# Patient Record
Sex: Male | Born: 1935 | Race: White | Hispanic: No | Marital: Married | Smoking: Former smoker
Health system: Southern US, Community
[De-identification: ages and names within clinical notes are randomized; demographics above are authoritative.]

## PROBLEM LIST (undated history)

## (undated) DIAGNOSIS — N183 Chronic kidney disease, stage 3 unspecified: Secondary | ICD-10-CM

## (undated) DIAGNOSIS — G8929 Other chronic pain: Secondary | ICD-10-CM

## (undated) DIAGNOSIS — C7951 Secondary malignant neoplasm of bone: Secondary | ICD-10-CM

## (undated) DIAGNOSIS — S12100A Unspecified displaced fracture of second cervical vertebra, initial encounter for closed fracture: Secondary | ICD-10-CM

## (undated) DIAGNOSIS — R531 Weakness: Secondary | ICD-10-CM

## (undated) DIAGNOSIS — R2681 Unsteadiness on feet: Secondary | ICD-10-CM

## (undated) DIAGNOSIS — M1990 Unspecified osteoarthritis, unspecified site: Secondary | ICD-10-CM

## (undated) DIAGNOSIS — Z7189 Other specified counseling: Secondary | ICD-10-CM

## (undated) DIAGNOSIS — I10 Essential (primary) hypertension: Secondary | ICD-10-CM

## (undated) DIAGNOSIS — Z789 Other specified health status: Secondary | ICD-10-CM

## (undated) DIAGNOSIS — M899 Disorder of bone, unspecified: Secondary | ICD-10-CM

## (undated) DIAGNOSIS — I872 Venous insufficiency (chronic) (peripheral): Secondary | ICD-10-CM

## (undated) DIAGNOSIS — R5381 Other malaise: Secondary | ICD-10-CM

## (undated) DIAGNOSIS — M48061 Spinal stenosis, lumbar region without neurogenic claudication: Secondary | ICD-10-CM

## (undated) DIAGNOSIS — Z8673 Personal history of transient ischemic attack (TIA), and cerebral infarction without residual deficits: Secondary | ICD-10-CM

## (undated) DIAGNOSIS — Z85038 Personal history of other malignant neoplasm of large intestine: Secondary | ICD-10-CM

## (undated) DIAGNOSIS — C8580 Other specified types of non-Hodgkin lymphoma, unspecified site: Secondary | ICD-10-CM

## (undated) DIAGNOSIS — M545 Low back pain, unspecified: Secondary | ICD-10-CM

## (undated) DIAGNOSIS — C801 Malignant (primary) neoplasm, unspecified: Secondary | ICD-10-CM

## (undated) HISTORY — DX: Chronic kidney disease, stage 3 unspecified: N18.30

## (undated) HISTORY — DX: Chronic kidney disease, stage 3 (moderate): N18.3

## (undated) HISTORY — DX: Low back pain: M54.5

## (undated) HISTORY — DX: Personal history of other malignant neoplasm of large intestine: Z85.038

## (undated) HISTORY — DX: Other specified counseling: Z71.89

## (undated) HISTORY — DX: Secondary malignant neoplasm of bone: C79.51

## (undated) HISTORY — DX: Venous insufficiency (chronic) (peripheral): I87.2

## (undated) HISTORY — DX: Spinal stenosis, lumbar region without neurogenic claudication: M48.061

## (undated) HISTORY — DX: Unspecified osteoarthritis, unspecified site: M19.90

## (undated) HISTORY — DX: Weakness: R53.1

## (undated) HISTORY — DX: Unspecified displaced fracture of second cervical vertebra, initial encounter for closed fracture: S12.100A

## (undated) HISTORY — DX: Other malaise: R53.81

## (undated) HISTORY — DX: Other specified types of non-hodgkin lymphoma, unspecified site: C85.80

## (undated) HISTORY — DX: Personal history of transient ischemic attack (TIA), and cerebral infarction without residual deficits: Z86.73

## (undated) HISTORY — DX: Low back pain, unspecified: M54.50

## (undated) HISTORY — DX: Other specified health status: Z78.9

## (undated) HISTORY — DX: Unsteadiness on feet: R26.81

## (undated) HISTORY — DX: Disorder of bone, unspecified: M89.9

## (undated) HISTORY — DX: Other chronic pain: G89.29

## (undated) HISTORY — DX: Malignant (primary) neoplasm, unspecified: C80.1

## (undated) HISTORY — DX: Essential (primary) hypertension: I10

---

## 1972-04-11 HISTORY — PX: INGUINAL HERNIA REPAIR: SHX194

## 1982-04-11 HISTORY — PX: CHOLECYSTECTOMY: SHX55

## 1991-04-12 DIAGNOSIS — Z85038 Personal history of other malignant neoplasm of large intestine: Secondary | ICD-10-CM

## 1991-04-12 HISTORY — DX: Personal history of other malignant neoplasm of large intestine: Z85.038

## 1991-04-12 HISTORY — PX: LUNG LOBECTOMY: SHX167

## 1991-04-12 HISTORY — PX: PARTIAL COLECTOMY: SHX5273

## 1999-11-12 ENCOUNTER — Encounter (INDEPENDENT_AMBULATORY_CARE_PROVIDER_SITE_OTHER): Payer: Self-pay | Admitting: Specialist

## 1999-11-12 ENCOUNTER — Ambulatory Visit (HOSPITAL_COMMUNITY): Admission: RE | Admit: 1999-11-12 | Discharge: 1999-11-12 | Payer: Self-pay | Admitting: Gastroenterology

## 2002-07-25 ENCOUNTER — Encounter: Payer: Self-pay | Admitting: Ophthalmology

## 2002-07-30 ENCOUNTER — Ambulatory Visit (HOSPITAL_COMMUNITY): Admission: RE | Admit: 2002-07-30 | Discharge: 2002-07-30 | Payer: Self-pay | Admitting: Ophthalmology

## 2003-09-30 ENCOUNTER — Encounter: Admission: RE | Admit: 2003-09-30 | Discharge: 2003-09-30 | Payer: Self-pay | Admitting: Cardiology

## 2003-11-19 ENCOUNTER — Ambulatory Visit (HOSPITAL_COMMUNITY): Admission: RE | Admit: 2003-11-19 | Discharge: 2003-11-19 | Payer: Self-pay | Admitting: Gastroenterology

## 2003-11-19 ENCOUNTER — Encounter (INDEPENDENT_AMBULATORY_CARE_PROVIDER_SITE_OTHER): Payer: Self-pay | Admitting: *Deleted

## 2009-06-08 ENCOUNTER — Encounter: Admission: RE | Admit: 2009-06-08 | Discharge: 2009-06-08 | Payer: Self-pay | Admitting: Orthopaedic Surgery

## 2010-04-11 HISTORY — PX: LUMBAR SPINE SURGERY: SHX701

## 2010-05-02 ENCOUNTER — Encounter: Payer: Self-pay | Admitting: Orthopaedic Surgery

## 2010-08-27 NOTE — H&P (Signed)
NAME:  Brendan Gonzalez, Brendan Gonzalez                           ACCOUNT NO.:  [id number]   MEDICAL RECORD NO.:  [id number]                   PATIENT TYPE:  OIB   LOCATION:  2899                                 FACILITY:  [facility]   PHYSICIAN:  Guadelupe Sabin, M.D.             DATE OF BIRTH:  04-25-1919   DATE OF ADMISSION:  07/30/2002  DATE OF DISCHARGE:                                HISTORY & PHYSICAL   CHIEF COMPLAINT:  This was a planned outpatient surgical admission of this  77 year old white male admitted for cataract implant surgery of the left  eye.   HISTORY OF PRESENT ILLNESS:  This patient has been seen in my office since  September 2000, complaining of blurred vision at distant and TV watching.  Fine print also was difficult.  The patient's visual acuity was recorded at  20/60- right eye, 20/60 left eye without correction, and 20/40 right eye and  20/30 left eye with correction.  The patient was issued new spectacle  lenses.  Nuclear cataract formation was noted in both eyes.  A detailed  fundus examination was normal.  Over the ensuing years, the patient has had  several refraction changes, as the cataract gradually became worse.  The  patient has elected to proceed with cataract implant surgery of the left eye  at this time, hoping for improved vision.  The patient was given an oral  discussion and printed information concerning the operative procedure and  its possible complications.  He signed an informed consent and arrangements  were made for his outpatient admission at this time.   PAST MEDICAL HISTORY:  The patient is under the care of Dr. Jaclyn Prime. Lucas Mallow,  who feels that the patient is in good general health and an average risk for  the proposed surgery.  He notes the patient's hypertension, treated with  medication.   ALLERGIES:  No known drug allergies.   REVIEW OF SYSTEMS:  No cardiorespiratory complaints.   PHYSICAL EXAMINATION:  VITAL SIGNS:  As recorded on admission,  blood  pressure 133/84, pulse 57, respirations 20, temperature 97.7 degrees.  GENERAL:  The patient is a pleasant, well-nourished, well-developed 77-year-  old white male, in no acute distress.  HEENT:  Eyes:  Visual acuity last recorded at 20/50 right eye, 20/40- left  eye, with the best correction.  Slit lamp examination confirms the presence  of nuclear cataract formation in both eyes.  Applanation tonometry 16 mm  both eyes.  A detailed fundus examination was normal.  CHEST:  Lungs clear to percussion and auscultation.  HEART:  Normal sinus rhythm, no cardiomegaly, no murmurs.  ABDOMEN:  Negative.  EXTREMITIES:  Negative.   ADMISSION DIAGNOSIS:  Senile nuclear cataract, both eyes.   SURGICAL PLAN:  A cataract implant surgery, left eye now, and right eye  later as needed.  Guadelupe Sabin, M.D.   HNJ/MEDQ  D:  07/30/2002  T:  07/30/2002  Job:  [id number]

## 2010-08-27 NOTE — Procedures (Signed)
[facility]  Patient:    Brendan Gonzalez, Brendan Gonzalez                        MRN: [id number] Proc. Date: 11/12/99 Adm. Date:  [id number] Attending:  Nelda Marseille CC:         Jaclyn Prime. Lucas Mallow, M.D.                           Procedure Report  PROCEDURE:  Colonoscopy with hot biopsy.  INDICATIONS:  Patient with history of colon cancer due for colonic screening.  INFORMED CONSENT:  Consent was signed after risk, benefits, methods and options were thoroughly discussed in the office multiple times in the past.   MEDICINES USED:  Demerol 60 mg, Versed 5 mg.  DESCRIPTION OF PROCEDURE:  Rectal inspection was pertinent for external hemorrhoids.  Digital exam was negative.  The video colonoscope was inserted and easily advanced around the colon to the cecum.  This did require some left abdominal pressure but no position changes.  The cecum was identified by the appendiceal orifice and the ileocecal valve.  No obvious abnormalities were seen on insertion.  The scope was then slowly withdrawn.  The prep was adequate.  There was some liquid stool that required washing and suctioning. On slow withdrawal through the colon, the cecum and ascending were normal.  In the mid transverse, a 2 mm polyp was seen and was hot biopsied x 1.  No other abnormality was seen as we slowly withdrew back to the rectum except for the anastomosis being seen at about 20 cm.  Once back in the rectum, the scope was retroflexed, pertinent for some internal hemorrhoids.  The scope was straightened and advanced a short ways up the sigmoid.  Air was suctioned and the scope removed.  The patient tolerated the procedure well, and there was no obvious immediate complication.  ENDOSCOPIC DIAGNOSES: 1. Internal and external hemorrhoids. 2. Left-sided anastomosis at 20 cm. 3. Proximal level of the mid transverse a 2 mm polyp, status post hot    biopsied. 4. Otherwise within normal limits to the  cecum.  PLAN:  Yearly rectals and guaiacs and blood work to include CBC, liver test, and CEA and probably PSA as well for screening as per Dr. Lucas Mallow.  Happy to see back p.r.n.  Otherwise probably recheck colonoscopy if doing well medically in five years unless needed sooner p.r.n. DD:  11/12/99 TD:  11/14/99 Job: [id number]

## 2010-08-27 NOTE — H&P (Signed)
NAME:  Brendan Gonzalez, Brendan Gonzalez                           ACCOUNT NO.:  [id number]   MEDICAL RECORD NO.:  [id number]                   PATIENT TYPE:  OIB   LOCATION:  2899                                 FACILITY:  [facility]   PHYSICIAN:  Guadelupe Sabin, M.D.             DATE OF BIRTH:  [birth year]   DATE OF ADMISSION:  07/30/2002  DATE OF DISCHARGE:                                HISTORY & PHYSICAL   PREOPERATIVE DIAGNOSIS:  Senile nuclear cataract, left eye.   POSTOPERATIVE DIAGNOSIS:  Senile nuclear cataract, left eye.   OPERATION:  Planned extracapsular cataract extraction, phacoemulsification,  primary insertion of posterior chamber intraocular lens implant, left eye.   SURGEON:  Guadelupe Sabin, M.D.   ASSISTANT:  Nurse.   ANESTHESIA:  Local 4% Xylocaine, 0.75% Marcaine, with Wydase added.  Anesthesia standby required.  The patient given sodium Pentothal  intravenously during the period of retrobulbar injection.  Topical  tetracaine and intraocular Xylocaine were also used.   DESCRIPTION OF PROCEDURE:  After the patient was prepped and draped, a lid  speculum was inserted in the left eye.  Schiotz tonometry was recorded at 5-  6 scale units with a 5.5 g weight, indicating a normal intraocular pressure.  A superior rectus traction suture was placed.  A peritomy was performed  adjacent to the limbus from the 11 to the 1 o'clock position.  The  corneal/scleral junction was cleaned, and a corneal/scleral groove made with  a 45-degree super blade.  The anterior chamber was then entered with the 2.5  mm diamond keratome at the 12 o'clock position, and the 15-degree blade at  the 2:30 position.  Using a bent #26 gauge needle on a Healon syringe, a  circular capsular rhexis was begun, and then completed with the Grabow  forceps.  Hydrodissection and hydrodelineation were performed using 1%  Xylocaine.  The nucleus was flipped vertically in the capsular bag, and then  cracked with the  Bechert pick and the phaco emulsifier tip.  A 30-degree  phaco emulsifier then emulsified the fragments.  The residual cortex was  then aspirated with the irrigation aspiration tip.  The total ultrasonic  time was 45 seconds.  The average power level was 14%.  The total amount of  fluid used was 60 mL.  Following the removal of the nucleus, the residual  cortex was aspirated with the irrigation aspiration tip.  The capsule  appeared intact with a brilliant red fundus reflex.  It was therefore  elected to insert an [employer] [id number] silicone three-piece  posterior chamber intraocular lens implant, diopter strength +17.00.  This  was inserted with the McDonald forceps into the anterior chamber and then  centered into the capsular bag using the [facility] lens rotator.  The lens  appeared to be well-centered.  The Healon which had been used during the  procedure was aspirated and replaced with balanced salt  solution and Miochol  ophthalmic solution.  The operative incisions appeared to be self-sealing,  and no sutures were required.  A light patch and protector shield were  applied to the operated left eye, along  with Maxitrol ointment.  The duration of the procedure and the anesthesia  administration was 45 minutes.  The patient tolerated the procedure well in general and left the operating  room for the recovery room in good condition.                                                Guadelupe Sabin, M.D.    HNJ/MEDQ  D:  07/30/2002  T:  07/30/2002  Job:  [id number]   cc:   Jaclyn Prime. Lucas Mallow, M.D.  [address]  [place]  [place] [address]  Fax: [phone]

## 2010-08-27 NOTE — Op Note (Signed)
NAME:  GONZALO, WAYMIRE                           ACCOUNT NO.:  [id number]   MEDICAL RECORD NO.:  [id number]                   PATIENT TYPE:  AMB   LOCATION:  ENDO                                 FACILITY:  [facility]   PHYSICIAN:  Petra Kuba, M.D.                 DATE OF BIRTH:  1919-10-26   DATE OF PROCEDURE:  11/19/2003  DATE OF DISCHARGE:                                 OPERATIVE REPORT   PROCEDURE:  Colonoscopy.   INDICATIONS FOR PROCEDURE:  History of colon cancer, due for repeat  screening.  Consent was signed after risks, benefits, methods, and options  were thoroughly discussed multiple times in the past.   MEDICATIONS USED:  Demerol 50, Versed 5.   PROCEDURE:  Rectal inspection was pertinent for external hemorrhoids, small.  Digital exam was negative.  The video colonoscope was inserted and easily  advanced around the colon to the cecum.  It did not require any abdominal  pressure or any position changes.  No obvious abnormality was seen on  insertion.  The cecum was identified by the appendiceal orifice and the  ileocecal valve.  The scope was slowly withdrawn.  The prep was adequate,  there was some liquid stool that required washing and suctioning.  On slow  withdrawal through the colon, four tiny polyps were seen which were all hot  biopsied x 1 and put in the same container.  They were in the ascending, 2  in the transverse, one in the descending.  There was a rare left sided  diverticula.  The low anastomosis was confirmed which looked normal.  No  other abnormalities were seen as we slowly withdrew back to the rectum.  Anorectal pull through and retroflexion confirmed some small hemorrhoids.  The scope was reinserted a short ways up the left side of the colon, air was  suctioned, the scope was removed.  The patient tolerated the procedure well.  There was no obvious immediate complications.   ENDOSCOPIC DIAGNOSIS:  1. Internal and external hemorrhoids.  2. Low  anastomosis.  3. Rare left sided diverticula.  4. Four tiny questionable polyps hot biopsied in the descending, two in the     transverse and one in the ascending.  5. Otherwise, within normal limits to the cecum.   PLAN:  Await pathology to determine future colonic screening.  Obviously,  will need to re-evaluate based on other medical problems.  Happy to see back  p.r.n.  Otherwise, return care to Dr. Lucas Mallow for the customary health care  maintenance and management to include yearly rectals and guaiacs.                                               Petra Kuba, M.D.    MEM/MEDQ  D:  11/19/2003  T:  11/19/2003  Job:  [id number]   cc:   Jaclyn Prime. Lucas Mallow, M.D.  [address]  [place]  [place] [address]  Fax: [phone]

## 2010-10-19 ENCOUNTER — Other Ambulatory Visit: Payer: Self-pay | Admitting: Internal Medicine

## 2010-10-19 DIAGNOSIS — H9390 Unspecified disorder of ear, unspecified ear: Secondary | ICD-10-CM

## 2010-10-22 ENCOUNTER — Ambulatory Visit
Admission: RE | Admit: 2010-10-22 | Discharge: 2010-10-22 | Disposition: A | Discharge status: Home / Self Care | Payer: PRIVATE HEALTH INSURANCE | Source: Ambulatory Visit | Attending: Internal Medicine | Admitting: Internal Medicine

## 2010-10-22 DIAGNOSIS — H9390 Unspecified disorder of ear, unspecified ear: Secondary | ICD-10-CM

## 2010-11-02 ENCOUNTER — Other Ambulatory Visit: Payer: Self-pay | Admitting: Internal Medicine

## 2010-11-02 DIAGNOSIS — E041 Nontoxic single thyroid nodule: Secondary | ICD-10-CM

## 2010-11-15 ENCOUNTER — Ambulatory Visit
Admission: RE | Admit: 2010-11-15 | Discharge: 2010-11-15 | Disposition: A | Discharge status: Home / Self Care | Payer: Medicare Other | Source: Ambulatory Visit | Attending: Internal Medicine | Admitting: Internal Medicine

## 2010-11-15 DIAGNOSIS — E041 Nontoxic single thyroid nodule: Secondary | ICD-10-CM

## 2011-06-23 ENCOUNTER — Other Ambulatory Visit: Payer: Self-pay | Admitting: Gastroenterology

## 2011-06-24 ENCOUNTER — Ambulatory Visit
Admission: RE | Admit: 2011-06-24 | Discharge: 2011-06-24 | Disposition: A | Discharge status: Home / Self Care | Payer: Medicare Other | Source: Ambulatory Visit | Attending: Gastroenterology | Admitting: Gastroenterology

## 2012-11-21 ENCOUNTER — Other Ambulatory Visit: Payer: Self-pay | Admitting: Neurological Surgery

## 2012-11-21 DIAGNOSIS — M79651 Pain in right thigh: Secondary | ICD-10-CM

## 2012-11-21 DIAGNOSIS — M545 Low back pain, unspecified: Secondary | ICD-10-CM

## 2012-11-21 DIAGNOSIS — M7918 Myalgia, other site: Secondary | ICD-10-CM

## 2012-11-21 DIAGNOSIS — M25561 Pain in right knee: Secondary | ICD-10-CM

## 2012-11-23 ENCOUNTER — Ambulatory Visit
Admission: RE | Admit: 2012-11-23 | Discharge: 2012-11-23 | Disposition: A | Discharge status: Home / Self Care | Payer: Medicare Other | Source: Ambulatory Visit | Attending: Neurological Surgery | Admitting: Neurological Surgery

## 2012-11-23 DIAGNOSIS — M7918 Myalgia, other site: Secondary | ICD-10-CM

## 2012-11-23 DIAGNOSIS — M79651 Pain in right thigh: Secondary | ICD-10-CM

## 2012-11-23 DIAGNOSIS — M545 Low back pain, unspecified: Secondary | ICD-10-CM

## 2012-11-23 DIAGNOSIS — M25561 Pain in right knee: Secondary | ICD-10-CM

## 2013-03-25 ENCOUNTER — Other Ambulatory Visit: Payer: Self-pay | Admitting: Internal Medicine

## 2013-03-25 DIAGNOSIS — E041 Nontoxic single thyroid nodule: Secondary | ICD-10-CM

## 2013-03-27 ENCOUNTER — Ambulatory Visit
Admission: RE | Admit: 2013-03-27 | Discharge: 2013-03-27 | Disposition: A | Discharge status: Home / Self Care | Payer: Medicare Other | Source: Ambulatory Visit | Attending: Internal Medicine | Admitting: Internal Medicine

## 2013-03-27 DIAGNOSIS — E041 Nontoxic single thyroid nodule: Secondary | ICD-10-CM

## 2013-04-01 ENCOUNTER — Other Ambulatory Visit: Payer: Self-pay | Admitting: Internal Medicine

## 2013-04-01 DIAGNOSIS — E041 Nontoxic single thyroid nodule: Secondary | ICD-10-CM

## 2013-04-24 ENCOUNTER — Ambulatory Visit
Admission: RE | Admit: 2013-04-24 | Discharge: 2013-04-24 | Disposition: A | Discharge status: Home / Self Care | Payer: Medicare Other | Source: Ambulatory Visit | Attending: Internal Medicine | Admitting: Internal Medicine

## 2013-04-24 ENCOUNTER — Other Ambulatory Visit (HOSPITAL_COMMUNITY)
Admission: RE | Admit: 2013-04-24 | Discharge: 2013-04-24 | Disposition: A | Discharge status: Home / Self Care | Payer: Medicare Other | Source: Ambulatory Visit | Attending: Interventional Radiology | Admitting: Interventional Radiology

## 2013-04-24 DIAGNOSIS — E041 Nontoxic single thyroid nodule: Secondary | ICD-10-CM | POA: Insufficient documentation

## 2013-11-22 ENCOUNTER — Other Ambulatory Visit: Payer: Self-pay | Admitting: Internal Medicine

## 2013-11-22 DIAGNOSIS — M48061 Spinal stenosis, lumbar region without neurogenic claudication: Secondary | ICD-10-CM

## 2013-11-26 ENCOUNTER — Ambulatory Visit
Admission: RE | Admit: 2013-11-26 | Discharge: 2013-11-26 | Disposition: A | Discharge status: Home / Self Care | Payer: Medicare Other | Source: Ambulatory Visit | Attending: Internal Medicine | Admitting: Internal Medicine

## 2013-11-26 DIAGNOSIS — M48061 Spinal stenosis, lumbar region without neurogenic claudication: Secondary | ICD-10-CM

## 2014-01-27 ENCOUNTER — Ambulatory Visit (INDEPENDENT_AMBULATORY_CARE_PROVIDER_SITE_OTHER): Payer: Medicare Other | Admitting: Family Medicine

## 2014-01-27 ENCOUNTER — Encounter: Payer: Self-pay | Admitting: Family Medicine

## 2014-01-27 VITALS — BP 138/97 | HR 68 | Temp 97.6°F | Resp 18 | Ht 66.0 in | Wt 158.0 lb

## 2014-01-27 DIAGNOSIS — J069 Acute upper respiratory infection, unspecified: Secondary | ICD-10-CM | POA: Insufficient documentation

## 2014-01-27 DIAGNOSIS — J209 Acute bronchitis, unspecified: Secondary | ICD-10-CM

## 2014-01-27 MED ORDER — PREDNISONE 20 MG PO TABS: ORAL_TABLET | ORAL | Status: DC

## 2014-01-27 MED ORDER — AZITHROMYCIN 250 MG PO TABS: ORAL_TABLET | ORAL | Status: DC

## 2014-01-27 NOTE — Progress Notes (Signed)
Office Note 01/27/2014  CC:  Chief Complaint  Patient presents with  . Establish Care  . URI    x 6 weeks  . Cough   HPI:  Brendan Gonzalez is a 80 y.o. White male who is here to establish care and discuss recent resp illness. Patient's most recent primary MD: Dr. Alyson Ingles at [facility]. Old records were not reviewed prior to or during today's visit except for some labs from 09/2013 that pt brought in with him today for me to copy.  These showed a normal CBC, normal CMET and magnesium except for Cr of 1.29 (which is about his baseline CRI), normal FLP, normal TSH, and borderline low vit D--for which he now takes daily vit D supplement.  About 6 weeks ago developed a URI, had temp 101 x 1 day, started coughing.  These sx's have all persisted (no worse and no better) except he has had no further fever.  No SOB.  Appetite fair. Taking cough drops and OTC tabs that say "relieves chest congestion".   Past Medical History  Diagnosis Date  . Colon cancer 1993  . Chronic renal insufficiency, stage III (moderate)     CrCl 40s  . Hypertension   . Lumbar spinal stenosis     Surgery 2012 helped but in 2015 pain has returned  . Osteoarthritis     Past Surgical History  Procedure Laterality Date  . Inguinal hernia repair  1974    Bilat  . Cholecystectomy  1984  . Partial colectomy  1993    for colon cancer  . Lung lobectomy  1993    LLL per pt--worry of possible cancer but turned out to be benign.  . Lumbar spine surgery  2012    for spinal stenosis (Dr. Jonelle Sports in [place])    Family History  Problem Relation Age of Onset  . Parkinson's disease Mother   . Heart attack Mother   . Cancer Brother   . Cancer Daughter     History   Social History  . Marital Status: Married    Spouse Name: N/A    Number of Children: N/A  . Years of Education: N/A   Occupational History  . Not on file.   Social History Main Topics  . Smoking status: Former Research scientist (life sciences)  .  Smokeless tobacco: Never Used  . Alcohol Use: 0.6 - 1.2 oz/week    1-2 Cans of beer per week     Comment: most days  . Drug Use: No  . Sexual Activity: Not on file   Other Topics Concern  . Not on file   Social History Narrative   Married, 1 son and 1 daughter.   Education: BS in Pharmacologist.   Occupation: Pharmacologist (retired).   As of 01/2014, living at [facility] with his wife.   No T/A/Ds.     MEDS: Not currently taking prednisone or azithromycin listed below Outpatient Encounter Prescriptions as of 01/27/2014  Medication Sig  . aspirin 325 MG tablet Take 325 mg by mouth daily.  [name] atenolol (TENORMIN) 100 MG tablet Take 100 mg by mouth daily.  . cholecalciferol (VITAMIN D) 1000 UNITS tablet Take 1,000 Units by mouth daily.  . enalapril (VASOTEC) 10 MG tablet Take 12.5 mg by mouth 2 (two) times daily.  [name] triamterene-hydrochlorothiazide (DYAZIDE) 50-25 MG per capsule Take 1 capsule by mouth every morning.  . vitamin C (ASCORBIC ACID) 500 MG tablet Take 500 mg by mouth daily.  . vitamin E 400  UNIT capsule Take 400 Units by mouth every other day.  [name] azithromycin (ZITHROMAX) 250 MG tablet 2 tabs po qd x 1d, then 1 tab po qd x 4d  . predniSONE (DELTASONE) 20 MG tablet 2 tabs po qd x 5d, then 1 tab po qd x 5d    No Known Allergies  ROS Review of Systems  Constitutional: Negative for fever and fatigue.  HENT: Negative for congestion and sore throat.   Eyes: Negative for visual disturbance.  Respiratory: Positive for cough.   Cardiovascular: Negative for chest pain.  Gastrointestinal: Negative for nausea and abdominal pain.  Genitourinary: Negative for dysuria.  Musculoskeletal: Negative for joint swelling.  Skin: Negative for rash.  Neurological: Negative for weakness and headaches.  Hematological: Negative for adenopathy.    PE; Blood pressure 138/97, pulse 68, temperature 97.6 F (36.4 C), temperature source Temporal, resp. rate 18, height 5\' 6"  (1.676 m), weight 158  lb (71.668 kg), SpO2 98.00%. VS: noted--normal. Gen: alert, NAD, NONTOXIC APPEARING. HEENT: eyes without injection, drainage, or swelling.  Ears: EACs clear, TMs with normal light reflex and landmarks.  Nose: Clear rhinorrhea, with some dried, crusty exudate adherent to mildly injected mucosa.  No purulent d/c.  No paranasal sinus TTP.  No facial swelling.  Throat and mouth without focal lesion.  No pharyngial swelling, erythema, or exudate.   Neck: supple, no LAD.   LUNGS: Some coarse insp/exp rhonchi bilat, nonlabored resps.  Aeration is fine.     CV: RRR, no m/r/g. EXT: no c/c/e SKIN: no rash   Pertinent labs:  None today (see HPI above for recent labs at previous PCP's office 10/04/13)  ASSESSMENT AND PLAN:   New pt; obtain old records.  Acute bronchitis with some sign of RAD.  Prolonged URI as well. Plan: azithromycin x 5d. Prednisone 40mg  qd x 5d, then 20mg  qd x 5d.   Mucinex DM OTC. Fluids, rest.  An After Visit Summary was printed and given to the patient.  Return in about 2 weeks (around 02/10/2014) for f/u resp illness.

## 2014-01-27 NOTE — Progress Notes (Signed)
Pre visit review using our clinic review tool, if applicable. No additional management support is needed unless otherwise documented below in the visit note. 

## 2014-02-10 ENCOUNTER — Encounter: Payer: Self-pay | Admitting: Family Medicine

## 2014-02-10 ENCOUNTER — Ambulatory Visit (INDEPENDENT_AMBULATORY_CARE_PROVIDER_SITE_OTHER): Payer: Medicare Other | Admitting: Family Medicine

## 2014-02-10 VITALS — BP 126/83 | HR 62 | Temp 98.0°F | Ht 66.0 in | Wt 156.0 lb

## 2014-02-10 DIAGNOSIS — N400 Enlarged prostate without lower urinary tract symptoms: Secondary | ICD-10-CM

## 2014-02-10 DIAGNOSIS — J209 Acute bronchitis, unspecified: Secondary | ICD-10-CM

## 2014-02-10 MED ORDER — TAMSULOSIN HCL 0.4 MG PO CAPS: ORAL_CAPSULE | ORAL | Status: DC

## 2014-02-10 NOTE — Progress Notes (Signed)
Pre visit review using our clinic review tool, if applicable. No additional management support is needed unless otherwise documented below in the visit note. 

## 2014-02-10 NOTE — Progress Notes (Signed)
OFFICE NOTE  02/16/2014  CC:  Chief Complaint  Patient presents with  . Follow-up     HPI: Patient is a 80 y.o. Caucasian male who is here for 2 wk f/u recent prolonged resp illness. He is s/p z-pack and prednisone 10d taper.  Mucinex DM symptomatic med. Very much improved: "almost back to normal".  Says he's had enlarged prostate x years: asks if he should get surgery to get it fixed. Sx's: Nocturia q2hrs, urinary hesitancy, incomplete emptying sometimes.  He recalls an episode of post-op acute urinary retention (back surgery 2012) but no other problems with urinary retention.  Some urgency sx's and occ incont --approx q2-3 d.  No dysuria.  He thinks he was tried on a med in the past for this that was very expensive but it didn't help, says it was not proscar but thinks it started with a P.   Pertinent PMH:  Past medical, surgical, social, and family history reviewed and no changes are noted since last office visit.  MEDS:  Outpatient Prescriptions Prior to Visit  Medication Sig Dispense Refill  . aspirin 325 MG tablet Take 325 mg by mouth daily.    [name] atenolol (TENORMIN) 100 MG tablet Take 100 mg by mouth daily.    . cholecalciferol (VITAMIN D) 1000 UNITS tablet Take 1,000 Units by mouth daily.    . enalapril (VASOTEC) 10 MG tablet Take 12.5 mg by mouth 2 (two) times daily.    [name] triamterene-hydrochlorothiazide (DYAZIDE) 50-25 MG per capsule Take 1 capsule by mouth every morning.    . vitamin C (ASCORBIC ACID) 500 MG tablet Take 500 mg by mouth daily.    . vitamin E 400 UNIT capsule Take 400 Units by mouth every other day.    [name] azithromycin (ZITHROMAX) 250 MG tablet 2 tabs po qd x 1d, then 1 tab po qd x 4d 6 tablet 0  . predniSONE (DELTASONE) 20 MG tablet 2 tabs po qd x 5d, then 1 tab po qd x 5d 15 tablet 0   No facility-administered medications prior to visit.    PE: Blood pressure 126/83, pulse 62, temperature 98 F (36.7 C), temperature source Temporal, height 5\' 6"  (1.676  m), weight 156 lb (70.761 kg), SpO2 98 %. Gen: Alert, well appearing.  Patient is oriented to person, place, time, and situation. CV: RRR Chest is clear, no wheezing or rales. Normal symmetric air entry throughout both lung fields. No chest wall deformities or tenderness. Rectal exam: negative without mass, lesions or tenderness, PROSTATE EXAM: smooth and symmetric without nodules or tenderness.   IMPRESSION AND PLAN:  1) Acute bronchitis; resolved appropriately.  2) BPH (plus possible urge incontinence). Decided on trial of flomax 0.4mg  qhs, titrate to 0.8mg  qhs over the next 1-2 wks as needed. Therapeutic expectations and side effect profile of medication discussed today.  Patient's questions answered. No urology referral at this time.  An After Visit Summary was printed and given to the patient.  FOLLOW UP: 4 wks

## 2014-02-12 ENCOUNTER — Ambulatory Visit: Payer: Medicare Other | Admitting: Family Medicine

## 2014-06-18 ENCOUNTER — Ambulatory Visit (INDEPENDENT_AMBULATORY_CARE_PROVIDER_SITE_OTHER): Payer: Medicare Other | Admitting: Family Medicine

## 2014-06-18 ENCOUNTER — Encounter: Payer: Self-pay | Admitting: Family Medicine

## 2014-06-18 VITALS — BP 140/90 | HR 56 | Temp 98.0°F | Ht 66.0 in | Wt 147.0 lb

## 2014-06-18 DIAGNOSIS — I10 Essential (primary) hypertension: Secondary | ICD-10-CM

## 2014-06-18 DIAGNOSIS — R05 Cough: Secondary | ICD-10-CM

## 2014-06-18 DIAGNOSIS — R059 Cough, unspecified: Secondary | ICD-10-CM

## 2014-06-18 DIAGNOSIS — N138 Other obstructive and reflux uropathy: Secondary | ICD-10-CM

## 2014-06-18 DIAGNOSIS — Z23 Encounter for immunization: Secondary | ICD-10-CM

## 2014-06-18 DIAGNOSIS — N401 Enlarged prostate with lower urinary tract symptoms: Secondary | ICD-10-CM

## 2014-06-18 MED ORDER — ATENOLOL 100 MG PO TABS: ORAL_TABLET | ORAL | Status: DC

## 2014-06-18 MED ORDER — TRIAMTERENE-HCTZ 75-50 MG PO TABS: 1.0000 | ORAL_TABLET | Freq: Every day | ORAL | Status: DC

## 2014-06-18 NOTE — Addendum Note (Signed)
Addended by: Julieta Bellini on: 06/18/2014 01:19 PM   Modules accepted: Orders

## 2014-06-18 NOTE — Patient Instructions (Signed)
Call our office if your blood pressure is consistently more than 150 on top or consistently more than 100 on bottom.    Stop your enalapril (Vasotec).

## 2014-06-18 NOTE — Progress Notes (Signed)
OFFICE NOTE  06/18/2014  CC:  Chief Complaint  Patient presents with  . Follow-up   HPI: Patient is a 81 y.o. Caucasian male who is here for f/u BPH, HTN. Back in November 2015 I rx'd him flomax.  Not taking it regularly, not noticing much difference. He wants to continue to try to take it more regularly: no switch desired at this time.  He monitors bp at home and gets <140/90 consistently. Denies HA/dizziness/CP/SOB.  No hypotension.  Has chronic cough, often worse in afternoons per wife but worse in mornings per pt.  Says it has been present a couple years at least, not sure if related to starting a certain med or not.   No OTC meds tried for the cough.  Pertinent PMH:  Past medical, surgical, social, and family history reviewed and no changes are noted since last office visit.  MEDS:  Outpatient Prescriptions Prior to Visit  Medication Sig Dispense Refill  . aspirin 325 MG tablet Take 325 mg by mouth 4 (four) times a week.     [name] atenolol (TENORMIN) 100 MG tablet 100 mg. Take 1/2 Tablets By Mouth Twice A Day    . cholecalciferol (VITAMIN D) 1000 UNITS tablet Take 1,000 Units by mouth daily.    . tamsulosin (FLOMAX) 0.4 MG CAPS capsule 1-2 tabs po qhs 60 capsule 3  . vitamin C (ASCORBIC ACID) 500 MG tablet Take 500 mg by mouth daily.    . vitamin E 400 UNIT capsule Take 400 Units by mouth every other day.    . enalapril (VASOTEC) 10 MG tablet Take 12.5 mg by mouth 2 (two) times daily.    [name] triamterene-hydrochlorothiazide (DYAZIDE) 50-25 MG per capsule Take 1 capsule by mouth every morning.     No facility-administered medications prior to visit.    PE: Blood pressure 140/90, pulse 56, temperature 98 F (36.7 C), temperature source Oral, height 5\' 6"  (1.676 m), weight 147 lb (66.679 kg), SpO2 99 %. Gen: Alert, well appearing.  Patient is oriented to person, place, time, and situation. FUX:NATF: no injection, icteris, swelling, or exudate.  EOMI, PERRLA. Mouth: lips without  lesion/swelling.  Oral mucosa pink and moist. Oropharynx without erythema, exudate, or swelling.  CV: RRR, no m/r/g.   LUNGS: CTA bilat, nonlabored resps, good aeration in all lung fields. EXT: no clubbing, cyanosis, or edema.   LABS: none  IMPRESSION AND PLAN:  1) HTN, well controlled.   Will continue atenolol and dyazide at current dosing. D/c enalapril due to possible ACE-I induced cough.  BP monitoring to continue at home, parameters given to call clinic for. Wanted to do BMET today but my staff was taken away early so no lab draw available. Will do this at next f/u.  2) Cough: ? ACE-I induced.  Stop enalapril and we'll give it at least a month to resolve.  3) BPH: no clear response to flomax but pt wants to keep trying it and take it more consistently to give it a fair chance.  An After Visit Summary was printed and given to the patient.  FOLLOW UP: 3 mo

## 2014-06-18 NOTE — Progress Notes (Signed)
Pre visit review using our clinic review tool, if applicable. No additional management support is needed unless otherwise documented below in the visit note. 

## 2014-06-19 ENCOUNTER — Telehealth: Payer: Self-pay | Admitting: Family Medicine

## 2014-06-19 NOTE — Telephone Encounter (Signed)
emmi mailed  °

## 2014-11-15 ENCOUNTER — Emergency Department (HOSPITAL_BASED_OUTPATIENT_CLINIC_OR_DEPARTMENT_OTHER)
Admission: EM | Admit: 2014-11-15 | Discharge: 2014-11-15 | Disposition: A | Discharge status: Home / Self Care | Payer: Medicare Other | Attending: Emergency Medicine | Admitting: Emergency Medicine

## 2014-11-15 ENCOUNTER — Encounter (HOSPITAL_BASED_OUTPATIENT_CLINIC_OR_DEPARTMENT_OTHER): Payer: Self-pay | Admitting: *Deleted

## 2014-11-15 ENCOUNTER — Emergency Department (HOSPITAL_BASED_OUTPATIENT_CLINIC_OR_DEPARTMENT_OTHER): Payer: Medicare Other

## 2014-11-15 DIAGNOSIS — Y929 Unspecified place or not applicable: Secondary | ICD-10-CM | POA: Diagnosis not present

## 2014-11-15 DIAGNOSIS — Z87891 Personal history of nicotine dependence: Secondary | ICD-10-CM | POA: Diagnosis not present

## 2014-11-15 DIAGNOSIS — Z7982 Long term (current) use of aspirin: Secondary | ICD-10-CM | POA: Insufficient documentation

## 2014-11-15 DIAGNOSIS — Y998 Other external cause status: Secondary | ICD-10-CM | POA: Diagnosis not present

## 2014-11-15 DIAGNOSIS — M1990 Unspecified osteoarthritis, unspecified site: Secondary | ICD-10-CM | POA: Insufficient documentation

## 2014-11-15 DIAGNOSIS — S20212A Contusion of left front wall of thorax, initial encounter: Secondary | ICD-10-CM | POA: Diagnosis not present

## 2014-11-15 DIAGNOSIS — S50312A Abrasion of left elbow, initial encounter: Secondary | ICD-10-CM | POA: Insufficient documentation

## 2014-11-15 DIAGNOSIS — Z85038 Personal history of other malignant neoplasm of large intestine: Secondary | ICD-10-CM | POA: Diagnosis not present

## 2014-11-15 DIAGNOSIS — Z79899 Other long term (current) drug therapy: Secondary | ICD-10-CM | POA: Diagnosis not present

## 2014-11-15 DIAGNOSIS — S80212A Abrasion, left knee, initial encounter: Secondary | ICD-10-CM | POA: Insufficient documentation

## 2014-11-15 DIAGNOSIS — N183 Chronic kidney disease, stage 3 (moderate): Secondary | ICD-10-CM | POA: Diagnosis not present

## 2014-11-15 DIAGNOSIS — W010XXA Fall on same level from slipping, tripping and stumbling without subsequent striking against object, initial encounter: Secondary | ICD-10-CM | POA: Diagnosis not present

## 2014-11-15 DIAGNOSIS — Y9301 Activity, walking, marching and hiking: Secondary | ICD-10-CM | POA: Diagnosis not present

## 2014-11-15 DIAGNOSIS — S2232XA Fracture of one rib, left side, initial encounter for closed fracture: Secondary | ICD-10-CM | POA: Diagnosis not present

## 2014-11-15 DIAGNOSIS — S29001A Unspecified injury of muscle and tendon of front wall of thorax, initial encounter: Secondary | ICD-10-CM | POA: Diagnosis present

## 2014-11-15 DIAGNOSIS — I129 Hypertensive chronic kidney disease with stage 1 through stage 4 chronic kidney disease, or unspecified chronic kidney disease: Secondary | ICD-10-CM | POA: Insufficient documentation

## 2014-11-15 MED ORDER — HYDROCODONE-ACETAMINOPHEN 5-325 MG PO TABS: 0.5000 | ORAL_TABLET | Freq: Four times a day (QID) | ORAL | Status: DC | PRN

## 2014-11-15 NOTE — ED Provider Notes (Signed)
CSN: [id number]     Arrival date & time 11/15/14  1122 History   First MD Initiated Contact with Patient 11/15/14 1131     Chief Complaint  Patient presents with  . Fall     (Consider location/radiation/quality/duration/timing/severity/associated sxs/prior Treatment) HPI Patient tripped and fell while walking 3 days ago. He suffered injuries to his left ribs, left elbow and left knee as result of event. Pain at left ribs is worse with changing positions or taking deep inspiration. He denies shortness of breath. Denies abdominal pain. Denies striking his head no neck pain no loss of consciousness no focal numbness or weakness. Pain is improved when he remains still. He is treated himself with hydrocodone last dose this morning with relief. He suffered abrasions to his left elbow and left knee which were treated by a nurse with an occlusive dressing at the left elbow and with Band-Aids at left knee. Pain at left elbow and left knee are minimal. No other associated symptoms Past Medical History  Diagnosis Date  . Colon cancer 1993  . Chronic renal insufficiency, stage III (moderate)     CrCl 40s  . Hypertension   . Lumbar spinal stenosis     Surgery 2012 helped but in 2015 pain has returned  . Osteoarthritis    Past Surgical History  Procedure Laterality Date  . Inguinal hernia repair  1974    Bilat  . Cholecystectomy  1984  . Partial colectomy  1993    for colon cancer  . Lung lobectomy  1993    LLL per pt--worry of possible cancer but turned out to be benign.  . Lumbar spine surgery  2012    for spinal stenosis (Dr. Jonelle Sports in [place])   Family History  Problem Relation Age of Onset  . Parkinson's disease Mother   . Heart attack Mother   . Cancer Brother   . Cancer Daughter    History  Substance Use Topics  . Smoking status: Former Research scientist (life sciences)  . Smokeless tobacco: Never Used  . Alcohol Use: 0.6 - 1.2 oz/week    1-2 Cans of beer per week     Comment: most days    Review of  Systems  Constitutional: Negative.   HENT: Negative.   Respiratory: Negative.   Cardiovascular: Positive for chest pain.       Left rib pain  Gastrointestinal: Negative.   Musculoskeletal: Negative.   Skin: Positive for wound.       Abrasions to left knee and left elbow  Allergic/Immunologic: Negative.        Up-to-date on tetanus immunization  Neurological: Negative.   Psychiatric/Behavioral: Negative.   All other systems reviewed and are negative.     Allergies  Review of patient's allergies indicates no known allergies.  Home Medications   Prior to Admission medications   Medication Sig Start Date End Date Taking? Authorizing Provider  aspirin 325 MG tablet Take 325 mg by mouth 4 (four) times a week.     Historical Provider, MD  atenolol (TENORMIN) 100 MG tablet Take 1/2 Tablets By Mouth Twice A Day 06/18/14   Tammi Sou, MD  cholecalciferol (VITAMIN D) 1000 UNITS tablet Take 1,000 Units by mouth daily.    Historical Provider, MD  tamsulosin (FLOMAX) 0.4 MG CAPS capsule 1-2 tabs po qhs 02/10/14   Tammi Sou, MD  triamterene-hydrochlorothiazide (MAXZIDE) 75-50 MG per tablet Take 1 tablet by mouth daily. 06/18/14   Tammi Sou, MD  vitamin C (ASCORBIC ACID)  500 MG tablet Take 500 mg by mouth daily.    Historical Provider, MD  vitamin E 400 UNIT capsule Take 400 Units by mouth every other day.    Historical Provider, MD   hydrocodone as needed BP 184/116 mmHg  Pulse 102  Temp(Src) 98.2 F (36.8 C) (Oral)  Resp 18  Wt 147 lb (66.679 kg)  SpO2 97% Physical Exam  Constitutional: He appears well-developed and well-nourished. No distress.  Alert Glasgow Coma Score 15  HENT:  Head: Normocephalic and atraumatic.  Eyes: Conjunctivae are normal. Pupils are equal, round, and reactive to light.  Neck: Neck supple. No tracheal deviation present. No thyromegaly present.  Cardiovascular: Normal rate and regular rhythm.   No murmur heard. Pulmonary/Chest: Effort normal  and breath sounds normal.  3 cm purplish ecchymosis at left anterior ribs approximate 5 cm superior to the costal margin. He is tender at left lateral ribs and midaxillary line no crepitance or flail  Abdominal: Soft. Bowel sounds are normal. He exhibits no distension. There is no tenderness.  Musculoskeletal: Normal range of motion. He exhibits no edema or tenderness.  Entire spine nontender. Thoracic kyphosis. Pelvis stable nontender. Left upper extremity there is a healing abrasion over the lateral aspect of the elbow. Full range of motion. No soft tissue swelling, neurovascular intact. Left lower extremity dime-sized abrasion over patella. No soft tissue swelling no bony tenderness for range of motion neurovascular intact.  Neurological: He is alert. No cranial nerve deficit. Coordination normal.  Gait normal  Skin: Skin is warm and dry. No rash noted.  Psychiatric: He has a normal mood and affect.  Nursing note and vitals reviewed.   ED Course  Procedures (including critical care time) Labs Review Labs Reviewed - No data to display  Imaging Review No results found.   EKG Interpretation None     Declines pain medicine here X-rays viewed by me No results found for this or any previous visit. Dg Ribs Unilateral W/chest Left  11/15/2014   CLINICAL DATA:  Fall 4 days ago w/ lt lateral to anterior rib pain, a bb marks this area, rx-smoker x 58 yrs, lt lung ca w/ lobectomy '76, Sob w/ rib pain while breathing, no cough  EXAM: LEFT RIBS AND CHEST - 3+ VIEW  COMPARISON:  None.  FINDINGS: There are nondisplaced fractures of the antral lateral left fourth, fifth sixth and seventh ribs.  No pneumothorax, pleural effusion or pulmonary contusion. Linear opacity is noted at both lung bases consistent with a combination of atelectasis and scarring. No lung consolidation or edema.  Surgical vascular clips project over the left hilum. Cardiac silhouette is normal in size. Aorta is uncoiled.  Bony  thorax is demineralized.  IMPRESSION: 1. Nondisplaced fractures of the left fourth through seventh ribs. No fracture complication.   Electronically Signed   By: Lajean Manes M.D.   On: 11/15/2014 12:34    12:58 PM patient's pain is under control MDM  X-rays of left upper extremity and left lower extremity not indicated. Discussed with patient and he agrees Plan he'll be written a prescription for Norco. Incentive spirometer to go.BP recheck 1 week. Hospitalization not indicated. Normal pulse oximetry normal respiratory rate injuries 8 days old Diagnosis #1 fall #2 multiple closed left rib fractures #3 abrasions multiple sites #4elevated blood pressure Final diagnoses:  None        Orlie Dakin, MD 11/15/14 1302

## 2014-11-15 NOTE — ED Notes (Signed)
Patient states he tripped and fell Wednesday afternoon after shopping, no LOC,  C/o L side rib pain, abrasion to left elbow dressed by nurse at their facility.

## 2014-11-15 NOTE — ED Notes (Signed)
MD at bedside. 

## 2014-11-15 NOTE — ED Notes (Signed)
Pt provided IS device with instructions on utilizing IS and importance of coughing and deep breathing, instructions provided by [facility] staff

## 2014-11-15 NOTE — ED Notes (Signed)
Pt states he fell Wednesday after leaving a grocery store, fell onto pavement, denied any dizziness, weakness or chest discomfort prior to fall, states has some pain on left side of chest

## 2014-11-15 NOTE — ED Notes (Signed)
Patient transported to X-ray via stretcher, sr x 2 up

## 2014-11-15 NOTE — ED Notes (Signed)
Left knee abrasions also noted on, bandaids removed for EDP evaluation, area cleaned, abx oint applied, bandaids applied

## 2014-11-15 NOTE — ED Notes (Signed)
Pt returns from radiology. 

## 2014-11-15 NOTE — Discharge Instructions (Signed)
Rib Fracture Take Tylenol for mild pain or the pain medicine prescribed for bad pain. Don't take Tylenol together with the pain medicine prescribed as the combination can be dangerous. Use the incentive spirometer 10 times every hour while awake(for example while watching television during every commercial). Get your blood pressure rechecked within a week. Today's was elevated at 188/110. Call Dr.Mckeown to arrange to be seen in the office if you has significant pain in 2 weeks. Return if your pain is not well controlled or if you feel worse for any reason. A rib fracture is a break or crack in one of the bones of the ribs. The ribs are a group of long, curved bones that wrap around your chest and attach to your spine. They protect your lungs and other organs in the chest cavity. A broken or cracked rib is often painful, but most do not cause other problems. Most rib fractures heal on their own over time. However, rib fractures can be more serious if multiple ribs are broken or if broken ribs move out of place and push against other structures. CAUSES   A direct blow to the chest. For example, this could happen during contact sports, a car accident, or a fall against a hard object.  Repetitive movements with high force, such as pitching a baseball or having severe coughing spells. SYMPTOMS   Pain when you breathe in or cough.  Pain when someone presses on the injured area. DIAGNOSIS  Your caregiver will perform a physical exam. Various imaging tests may be ordered to confirm the diagnosis and to look for related injuries. These tests may include a chest X-ray, computed tomography (CT), magnetic resonance imaging (MRI), or a bone scan. TREATMENT  Rib fractures usually heal on their own in 1-3 months. The longer healing period is often associated with a continued cough or other aggravating activities. During the healing period, pain control is very important. Medication is usually given to control  pain. Hospitalization or surgery may be needed for more severe injuries, such as those in which multiple ribs are broken or the ribs have moved out of place.  HOME CARE INSTRUCTIONS   Avoid strenuous activity and any activities or movements that cause pain. Be careful during activities and avoid bumping the injured rib.  Gradually increase activity as directed by your caregiver.  Only take over-the-counter or prescription medications as directed by your caregiver. Do not take other medications without asking your caregiver first.  Apply ice to the injured area for the first 1-2 days after you have been treated or as directed by your caregiver. Applying ice helps to reduce inflammation and pain.  Put ice in a plastic bag.  Place a towel between your skin and the bag.   Leave the ice on for 15-20 minutes at a time, every 2 hours while you are awake.  Perform deep breathing as directed by your caregiver. This will help prevent pneumonia, which is a common complication of a broken rib. Your caregiver may instruct you to:  Take deep breaths several times a day.  Try to cough several times a day, holding a pillow against the injured area.  Use a device called an incentive spirometer to practice deep breathing several times a day.  Drink enough fluids to keep your urine clear or pale yellow. This will help you avoid constipation.   Do not wear a rib belt or binder. These restrict breathing, which can lead to pneumonia.  SEEK IMMEDIATE MEDICAL CARE IF:  You have a fever.   You have difficulty breathing or shortness of breath.   You develop a continual cough, or you cough up thick or bloody sputum.  You feel sick to your stomach (nausea), throw up (vomit), or have abdominal pain.   You have worsening pain not controlled with medications.  MAKE SURE YOU:  Understand these instructions.  Will watch your condition.  Will get help right away if you are not doing well or get  worse. Document Released: 03/28/2005 Document Revised: 11/28/2012 Document Reviewed: 05/30/2012 [facility] Patient Information 2015 [place], [place]. This information is not intended to replace advice given to you by your health care provider. Make sure you discuss any questions you have with your health care provider.

## 2014-11-15 NOTE — ED Notes (Signed)
Pt questioned re: current medication or any blood thinners currently being taken, pt states he takes one medication for HTN and one 325mg  ASA 4 times / week. EDP informed

## 2015-03-13 ENCOUNTER — Other Ambulatory Visit: Payer: Self-pay | Admitting: Family Medicine

## 2015-03-13 MED ORDER — HYDROCODONE-ACETAMINOPHEN 5-325 MG PO TABS: 0.5000 | ORAL_TABLET | Freq: Four times a day (QID) | ORAL | Status: DC | PRN

## 2015-07-13 ENCOUNTER — Other Ambulatory Visit: Payer: Self-pay | Admitting: *Deleted

## 2015-07-13 NOTE — Telephone Encounter (Signed)
RF request for hydrocodone/apap LOV: 06/18/14 Next ov: None Last written: 03/13/15 #60 w/ 0RF  Pt called requesting refill. He stated that he does not take this often but occasionally has to take if for his back pain. He stated that his wife has took what was left of his medication due to her back pain. He is requesting a refill. Please advise. Thanks.

## 2015-07-14 DIAGNOSIS — H04123 Dry eye syndrome of bilateral lacrimal glands: Secondary | ICD-10-CM | POA: Diagnosis not present

## 2015-07-14 DIAGNOSIS — H43813 Vitreous degeneration, bilateral: Secondary | ICD-10-CM | POA: Diagnosis not present

## 2015-07-14 DIAGNOSIS — H35372 Puckering of macula, left eye: Secondary | ICD-10-CM | POA: Diagnosis not present

## 2015-07-14 DIAGNOSIS — H52223 Regular astigmatism, bilateral: Secondary | ICD-10-CM | POA: Diagnosis not present

## 2015-07-14 DIAGNOSIS — H524 Presbyopia: Secondary | ICD-10-CM | POA: Diagnosis not present

## 2015-07-14 MED ORDER — HYDROCODONE-ACETAMINOPHEN 5-325 MG PO TABS: 0.5000 | ORAL_TABLET | Freq: Four times a day (QID) | ORAL | Status: DC | PRN

## 2015-07-15 NOTE — Telephone Encounter (Signed)
Rx put up at front desk for p/u. Left message on both home and cell stating that Rx is ready for p/u at front desk.

## 2015-10-06 ENCOUNTER — Ambulatory Visit (INDEPENDENT_AMBULATORY_CARE_PROVIDER_SITE_OTHER): Payer: Medicare Other | Admitting: Family Medicine

## 2015-10-06 ENCOUNTER — Encounter: Payer: Self-pay | Admitting: Family Medicine

## 2015-10-06 VITALS — BP 184/110 | HR 63 | Temp 98.1°F | Resp 16 | Ht 65.5 in | Wt 163.2 lb

## 2015-10-06 DIAGNOSIS — I10 Essential (primary) hypertension: Secondary | ICD-10-CM

## 2015-10-06 DIAGNOSIS — Z23 Encounter for immunization: Secondary | ICD-10-CM

## 2015-10-06 DIAGNOSIS — Z0000 Encounter for general adult medical examination without abnormal findings: Secondary | ICD-10-CM | POA: Diagnosis not present

## 2015-10-06 LAB — BASIC METABOLIC PANEL
BUN: 14 mg/dL (ref 6–23)
CHLORIDE: 99 meq/L (ref 96–112)
CO2: 32 meq/L (ref 19–32)
Calcium: 9.4 mg/dL (ref 8.4–10.5)
Creatinine, Ser: 1.03 mg/dL (ref 0.40–1.50)
GFR: 71.45 mL/min (ref >60.00)
Glucose, Bld: 70 mg/dL (ref 70–99)
Potassium: 4.4 mEq/L (ref 3.5–5.1)
SODIUM: 138 meq/L (ref 135–145)

## 2015-10-06 MED ORDER — ENALAPRIL MALEATE 5 MG PO TABS: 5.0000 mg | ORAL_TABLET | Freq: Two times a day (BID) | ORAL | Status: DC

## 2015-10-06 NOTE — Progress Notes (Signed)
Pre visit review using our clinic review tool, if applicable. No additional management support is needed unless otherwise documented below in the visit note. 

## 2015-10-06 NOTE — Addendum Note (Signed)
Addended by: Onalee Hua on: 10/06/2015 09:38 AM   Modules accepted: Orders

## 2015-10-06 NOTE — Progress Notes (Signed)
The patient is here for annual Medicare wellness examination and management of other chronic and acute problems. Other problems discussed today: HTN: home bp varies: sometimes normal, sometimes like it is today. He has no symptoms when it is high.  He never has any low blood pressures.  Has intermittent wooziness---describes orthostatic disequilibrium.  No falls.  He is compliant with his bp med. EXAM: BP 184/110 mmHg  Pulse 63  Temp(Src) 98.1 F (36.7 C) (Oral)  Resp 16  Ht 5' 5.5" (1.664 m)  Wt 163 lb 4 oz (74.05 kg)  BMI 26.74 kg/m2  SpO2 97%bp recheck today 184/110 Gen: Alert, well appearing.  Patient is oriented to person, place, time, and situation. AFFECT: pleasant, lucid thought and speech. CV: RRR (rate 60-70), occ brief pause.  No m/r/g. Chest is clear, no wheezing or rales. Normal symmetric air entry throughout both lung fields. No chest wall deformities or tenderness. EXT: no clubbing, cyanosis, or edema.   A/P: HTN, poor control.  I will restart his enalapril (stopping this back in March 2016 did not change pt's cough per his report today) at 5mg  bid.  Continue atenolol 100mg , 1/2 tab bid and also Maxzide 50-25 1 cap qd. I'll see him back in 2-3 weeks for CPE. BMET drawn today.   AWV DATA The risk factors are reflected in the social history.  The roster of all physicians providing medical care to patient is listed in the Snapshot section of the chart.  Activities of daily living:  The patient is 100% independent in all ADLs: dressing, toileting, feeding as well as independent mobility.  Home safety : The patient has smoke detectors in the home. They wear seatbelts. No firearms at home ( firearms are present in the home, kept in a safe fashion). There is no violence in the home.   There is no risks for hepatitis, STDs or HIV. There is no history of blood transfusion. They have no travel history to infectious disease endemic areas of the world.  The patient has has seen  their dentist in the last six month. They have seen their eye doctor in the last year. They deny (admit to) any hearing difficulty and have not had audiologic testing in the last year.  They do not  have excessive sun exposure. Discussed the need for sun protection: hats, long sleeves and use of sunscreen if there is significant sun exposure.   Diet: the importance of a healthy diet is discussed. They do have a healthy diet.  The patient does not have a regular exercise program.  The benefits of regular aerobic exercise were discussed.  Depression screen: there are no signs or vegative symptoms of depression- irritability, change in appetite, anhedonia, sadness/tearfullness.  Cognitive assessment: the patient manages all their financial and personal affairs and is actively engaged. They could relate day,date,year and events; recalled 3/3 objects at 3 minutes; performed clock-face test normally.  Reviewed advance directives with pt today.  He has these in place.  The following portions of the patient's history were reviewed and updated as appropriate: allergies, current medications, past family history, past medical history,  past surgical history, past social history  and problem list.  Vision, hearing, body mass index were assessed and reviewed. Body mass index is 26.74 kg/(m^2).   During the course of the visit the patient was educated and counseled about appropriate screening and preventive services including :  Annual wellness visit: doing today. diabetes screening: drew fasting glucose today. colorectal cancer screening: pt declines  due to age. recommended immunizations (influenza, pneumococcal, Hep B): says he got pneumovax sometime in the last 10 yrs ([place]).  Wants to get prevnar 13 today. Bone mass measurement: n/a Counseling to prevent tobacco use: n/a Depression screening: discussed.  Brief screen today normal. Glaucoma screening: done by pt's eye MD (Dr. Bing Plume) Hepatitis C  virus screening: declines HIV virus screening: declines Lung cancer screening: n/a Medical nutrition therapy: n/a Prostate cancer screening: declines Screening mammography: n/a Screening pap tests, pelvic exam, and clinical breast exam: n/a Ultrasound screening for AAA: n/a  A written plan of action regarding the above screening and preventative services was given to the patient today.  We'll check fasting glucose today and we gave prevnar 13.  An After Visit Summary was printed and given to the patient.  Signed:  Crissie Sickles, MD           10/06/2015

## 2015-10-08 LAB — LIPID PANEL
CHOLESTEROL: 194 mg/dL (ref 0–200)
HDL: 75.1 mg/dL (ref >39.00)
LDL CALC: 96 mg/dL (ref 0–99)
NONHDL: 118.82
Total CHOL/HDL Ratio: 3
Triglycerides: 115 mg/dL (ref 0.0–149.0)
VLDL: 23 mg/dL (ref 0.0–40.0)

## 2016-01-21 ENCOUNTER — Other Ambulatory Visit: Payer: Self-pay | Admitting: Family Medicine

## 2016-01-21 MED ORDER — HYDROCODONE-ACETAMINOPHEN 5-325 MG PO TABS: 0.5000 | ORAL_TABLET | Freq: Four times a day (QID) | ORAL | 0 refills | Status: DC | PRN

## 2016-02-12 ENCOUNTER — Ambulatory Visit: Payer: Medicare Other

## 2016-03-14 ENCOUNTER — Emergency Department (HOSPITAL_COMMUNITY): Payer: Medicare Other

## 2016-03-14 ENCOUNTER — Observation Stay (HOSPITAL_COMMUNITY): Payer: Medicare Other

## 2016-03-14 ENCOUNTER — Encounter (HOSPITAL_COMMUNITY): Payer: Self-pay | Admitting: Emergency Medicine

## 2016-03-14 ENCOUNTER — Other Ambulatory Visit (HOSPITAL_COMMUNITY): Payer: Medicare Other

## 2016-03-14 ENCOUNTER — Inpatient Hospital Stay (HOSPITAL_COMMUNITY)
Admission: EM | Admit: 2016-03-14 | Discharge: 2016-03-16 | DRG: 066 | Disposition: A | Discharge status: Home Health | Payer: Medicare Other | Attending: Internal Medicine | Admitting: Internal Medicine

## 2016-03-14 DIAGNOSIS — M1990 Unspecified osteoarthritis, unspecified site: Secondary | ICD-10-CM | POA: Diagnosis present

## 2016-03-14 DIAGNOSIS — I160 Hypertensive urgency: Secondary | ICD-10-CM | POA: Diagnosis present

## 2016-03-14 DIAGNOSIS — R4781 Slurred speech: Secondary | ICD-10-CM

## 2016-03-14 DIAGNOSIS — Z9049 Acquired absence of other specified parts of digestive tract: Secondary | ICD-10-CM

## 2016-03-14 DIAGNOSIS — I639 Cerebral infarction, unspecified: Secondary | ICD-10-CM

## 2016-03-14 DIAGNOSIS — I10 Essential (primary) hypertension: Secondary | ICD-10-CM

## 2016-03-14 DIAGNOSIS — R471 Dysarthria and anarthria: Secondary | ICD-10-CM | POA: Diagnosis present

## 2016-03-14 DIAGNOSIS — N183 Chronic kidney disease, stage 3 unspecified: Secondary | ICD-10-CM

## 2016-03-14 DIAGNOSIS — R29818 Other symptoms and signs involving the nervous system: Secondary | ICD-10-CM | POA: Diagnosis not present

## 2016-03-14 DIAGNOSIS — I6789 Other cerebrovascular disease: Secondary | ICD-10-CM | POA: Diagnosis not present

## 2016-03-14 DIAGNOSIS — Z85038 Personal history of other malignant neoplasm of large intestine: Secondary | ICD-10-CM

## 2016-03-14 DIAGNOSIS — R29810 Facial weakness: Secondary | ICD-10-CM | POA: Diagnosis not present

## 2016-03-14 DIAGNOSIS — R2990 Unspecified symptoms and signs involving the nervous system: Secondary | ICD-10-CM | POA: Diagnosis not present

## 2016-03-14 DIAGNOSIS — E785 Hyperlipidemia, unspecified: Secondary | ICD-10-CM | POA: Diagnosis present

## 2016-03-14 DIAGNOSIS — I129 Hypertensive chronic kidney disease with stage 1 through stage 4 chronic kidney disease, or unspecified chronic kidney disease: Secondary | ICD-10-CM | POA: Diagnosis present

## 2016-03-14 DIAGNOSIS — Z79899 Other long term (current) drug therapy: Secondary | ICD-10-CM

## 2016-03-14 DIAGNOSIS — I6340 Cerebral infarction due to embolism of unspecified cerebral artery: Secondary | ICD-10-CM | POA: Insufficient documentation

## 2016-03-14 DIAGNOSIS — Z87891 Personal history of nicotine dependence: Secondary | ICD-10-CM

## 2016-03-14 DIAGNOSIS — Z7982 Long term (current) use of aspirin: Secondary | ICD-10-CM

## 2016-03-14 DIAGNOSIS — I63412 Cerebral infarction due to embolism of left middle cerebral artery: Secondary | ICD-10-CM | POA: Diagnosis not present

## 2016-03-14 LAB — PROTIME-INR
INR: 0.95
INR: 0.99
PROTHROMBIN TIME: 12.6 s (ref 11.4–15.2)
PROTHROMBIN TIME: 13.1 s (ref 11.4–15.2)

## 2016-03-14 LAB — COMPREHENSIVE METABOLIC PANEL
ALBUMIN: 3.9 g/dL (ref 3.5–5.0)
ALK PHOS: 53 U/L (ref 38–126)
ALT: 13 U/L — ABNORMAL LOW (ref 17–63)
ANION GAP: 8 (ref 5–15)
AST: 29 U/L (ref 15–41)
BUN: 16 mg/dL (ref 6–20)
CHLORIDE: 105 mmol/L (ref 101–111)
CO2: 26 mmol/L (ref 22–32)
Calcium: 9.6 mg/dL (ref 8.9–10.3)
Creatinine, Ser: 1.28 mg/dL — ABNORMAL HIGH (ref 0.61–1.24)
GFR calc Af Amer: 53 mL/min — ABNORMAL LOW (ref >60)
GFR calc non Af Amer: 46 mL/min — ABNORMAL LOW (ref >60)
GLUCOSE: 125 mg/dL — AB (ref 65–99)
POTASSIUM: 4 mmol/L (ref 3.5–5.1)
SODIUM: 139 mmol/L (ref 135–145)
Total Bilirubin: 1.2 mg/dL (ref 0.3–1.2)
Total Protein: 7.2 g/dL (ref 6.5–8.1)

## 2016-03-14 LAB — CBC
HCT: 44.2 % (ref 39.0–52.0)
Hemoglobin: 14.9 g/dL (ref 13.0–17.0)
MCH: 32 pg (ref 26.0–34.0)
MCHC: 33.7 g/dL (ref 30.0–36.0)
MCV: 95.1 fL (ref 78.0–100.0)
PLATELETS: 183 10*3/uL (ref 150–400)
RBC: 4.65 MIL/uL (ref 4.22–5.81)
RDW: 14.3 % (ref 11.5–15.5)
WBC: 8.2 10*3/uL (ref 4.0–10.5)

## 2016-03-14 LAB — URINALYSIS, ROUTINE W REFLEX MICROSCOPIC
BILIRUBIN URINE: NEGATIVE
GLUCOSE, UA: NEGATIVE mg/dL
Hgb urine dipstick: NEGATIVE
KETONES UR: NEGATIVE mg/dL
Leukocytes, UA: NEGATIVE
Nitrite: NEGATIVE
PH: 7.5 (ref 5.0–8.0)
Protein, ur: NEGATIVE mg/dL
SPECIFIC GRAVITY, URINE: 1.008 (ref 1.005–1.030)

## 2016-03-14 LAB — DIFFERENTIAL
BASOS PCT: 1 %
Basophils Absolute: 0 10*3/uL (ref 0.0–0.1)
EOS ABS: 0.3 10*3/uL (ref 0.0–0.7)
EOS PCT: 3 %
LYMPHS PCT: 21 %
Lymphs Abs: 1.8 10*3/uL (ref 0.7–4.0)
Monocytes Absolute: 0.8 10*3/uL (ref 0.1–1.0)
Monocytes Relative: 10 %
NEUTROS PCT: 65 %
Neutro Abs: 5.4 10*3/uL (ref 1.7–7.7)

## 2016-03-14 LAB — APTT
aPTT: 25 seconds (ref 24–36)
aPTT: 26 seconds (ref 24–36)

## 2016-03-14 LAB — RAPID URINE DRUG SCREEN, HOSP PERFORMED
AMPHETAMINES: NOT DETECTED
Barbiturates: NOT DETECTED
Benzodiazepines: NOT DETECTED
COCAINE: NOT DETECTED
OPIATES: NOT DETECTED
TETRAHYDROCANNABINOL: NOT DETECTED

## 2016-03-14 LAB — I-STAT TROPONIN, ED: Troponin i, poc: 0.01 ng/mL (ref 0.00–0.08)

## 2016-03-14 LAB — I-STAT CHEM 8, ED
BUN: 22 mg/dL — AB (ref 6–20)
CHLORIDE: 101 mmol/L (ref 101–111)
Calcium, Ion: 1.19 mmol/L (ref 1.15–1.40)
Creatinine, Ser: 1.3 mg/dL — ABNORMAL HIGH (ref 0.61–1.24)
Glucose, Bld: 124 mg/dL — ABNORMAL HIGH (ref 65–99)
HEMATOCRIT: 45 % (ref 39.0–52.0)
Hemoglobin: 15.3 g/dL (ref 13.0–17.0)
Potassium: 3.9 mmol/L (ref 3.5–5.1)
SODIUM: 139 mmol/L (ref 135–145)
TCO2: 30 mmol/L (ref 0–100)

## 2016-03-14 LAB — ETHANOL: Alcohol, Ethyl (B): <5 mg/dL (ref <5)

## 2016-03-14 LAB — CBG MONITORING, ED: GLUCOSE-CAPILLARY: 115 mg/dL — AB (ref 65–99)

## 2016-03-14 LAB — TROPONIN I

## 2016-03-14 MED ORDER — TRIAMTERENE-HCTZ 75-50 MG PO TABS
0.5000 | ORAL_TABLET | Freq: Two times a day (BID) | ORAL | Status: DC
  Filled 2016-03-14: qty 0.5

## 2016-03-14 MED ORDER — ACETAMINOPHEN 650 MG RE SUPP: 650.0000 mg | RECTAL | Status: DC | PRN

## 2016-03-14 MED ORDER — ASPIRIN 325 MG PO TABS: 325.0000 mg | ORAL_TABLET | Freq: Four times a day (QID) | ORAL | Status: DC | PRN

## 2016-03-14 MED ORDER — HYDRALAZINE HCL 20 MG/ML IJ SOLN: 5.0000 mg | Freq: Three times a day (TID) | INTRAMUSCULAR | Status: DC | PRN

## 2016-03-14 MED ORDER — ACETAMINOPHEN 325 MG PO TABS
650.0000 mg | ORAL_TABLET | ORAL | Status: DC | PRN
  Administered 2016-03-15: 650 mg via ORAL
  Filled 2016-03-14 (×3): qty 2

## 2016-03-14 MED ORDER — LABETALOL HCL 5 MG/ML IV SOLN
10.0000 mg | Freq: Once | INTRAVENOUS | Status: AC
  Administered 2016-03-14: 10 mg via INTRAVENOUS
  Filled 2016-03-14: qty 4

## 2016-03-14 MED ORDER — ARTIFICIAL TEARS OP OINT
TOPICAL_OINTMENT | Freq: Every day | OPHTHALMIC | Status: DC
  Administered 2016-03-14 – 2016-03-16 (×3): via OPHTHALMIC
  Filled 2016-03-14 (×3): qty 3.5

## 2016-03-14 MED ORDER — GADOBENATE DIMEGLUMINE 529 MG/ML IV SOLN
20.0000 mL | Freq: Once | INTRAVENOUS | Status: AC
  Administered 2016-03-14: 16 mL via INTRAVENOUS

## 2016-03-14 MED ORDER — SENNOSIDES-DOCUSATE SODIUM 8.6-50 MG PO TABS: 1.0000 | ORAL_TABLET | Freq: Every evening | ORAL | Status: DC | PRN

## 2016-03-14 MED ORDER — HEPARIN SODIUM (PORCINE) 5000 UNIT/ML IJ SOLN
5000.0000 [IU] | Freq: Three times a day (TID) | INTRAMUSCULAR | Status: DC
  Administered 2016-03-14 – 2016-03-16 (×6): 5000 [IU] via SUBCUTANEOUS
  Filled 2016-03-14 (×6): qty 1

## 2016-03-14 MED ORDER — SODIUM CHLORIDE 0.9 % IV BOLUS (SEPSIS)
1000.0000 mL | Freq: Once | INTRAVENOUS | Status: AC
Start: 2016-03-14 — End: 2016-03-14
  Administered 2016-03-14: 1000 mL via INTRAVENOUS

## 2016-03-14 MED ORDER — ATENOLOL 100 MG PO TABS
100.0000 mg | ORAL_TABLET | Freq: Every day | ORAL | Status: DC
  Administered 2016-03-15 – 2016-03-16 (×2): 100 mg via ORAL
  Filled 2016-03-14 (×2): qty 1

## 2016-03-14 MED ORDER — STROKE: EARLY STAGES OF RECOVERY BOOK
Freq: Once | Status: AC
  Administered 2016-03-15: 16:00:00
  Filled 2016-03-14 (×2): qty 1

## 2016-03-14 MED ORDER — HYDRALAZINE HCL 20 MG/ML IJ SOLN
5.0000 mg | Freq: Three times a day (TID) | INTRAMUSCULAR | Status: DC | PRN
  Administered 2016-03-14: 5 mg via INTRAVENOUS
  Filled 2016-03-14: qty 1

## 2016-03-14 MED ORDER — SODIUM CHLORIDE 0.9 % IV SOLN
INTRAVENOUS | Status: DC
  Administered 2016-03-14: 19:00:00 via INTRAVENOUS

## 2016-03-14 NOTE — ED Notes (Signed)
Pt. Here for slurred speech and R facial droop. Pt. From home and last seen normal was 7AM. Here with HTN.  20G L forearm A/Ox4 No 02 needed  Calm and cooperative with son at bedside. Able to use the urinal  Facial droop noted but grip strength equal and moderate. Flexion and extension of the feet bilateral.  Failed on swallow screen because facial droop severe and pt. sts he has had  Problems swallowing in the past. I have only had patient for an hour while we hold beds. His BP has been high for me. Low 200's over 100's down to the 180's over low 100's. He has PRN hydralazine and when it registered 224/137 I gave the 5mg  of hydralazine. No complaints otherwise

## 2016-03-14 NOTE — ED Triage Notes (Signed)
Pt to ER by [facility] from home as activated code stroke. Pt LSN 6 am. At 8:30 friends noted slurred speech and right facial droop. Pt is alert and oriented x4. EMS VS  200/115, HR 100, on RA 97%, CBG 130.

## 2016-03-14 NOTE — Progress Notes (Signed)
Pt has arrived back to floor from MRI.

## 2016-03-14 NOTE — Code Documentation (Signed)
82yo male arriving to [facility] via [facility] at 1035.  Patient was out having coffee with friends when he was noted to have right facial droop and slurred speech at 0830.  LKW is unclear as patient reports speaking to his wife before leaving the house but cannot report if symptoms were present at that time.  Stroke team at the bedside on patient arrival.  Labs drawn and patient to CT with team.  CT completed.  NIHSS 3, see documentation for details and code stroke times.  Patient with right facial droop and dysarthria on exam.  Patient with worsening dysarthria following CT, MD aware.  Patient is outside the window for treatment with tPA.  NS bolus ordered by MD.  Patient to be admitted for stroke work-up.  Bedside handoff with ED RN Danelle Berry.

## 2016-03-14 NOTE — H&P (Signed)
History and Physical    Brendan Gonzalez [id number] DOB: 02-28-1920 DOA: 03/14/2016   PCP: Tammi Sou, MD   Patient coming from:  Home   Chief Complaint: Right facial weakness   HPI: Brendan Gonzalez is a 82 y.o. male with medical history significant  Hypertension, CKD 3, a history of remote: cancer, presenting to the emergency department with right facial weakness, and slurred speech. Symptoms began around a 31 having breakfast, when he began to experiencing slurring of his words. He denies any numbness or weakness on his extremities. He denies any this file GR. He denies any headaches, or new vision changes. No confusion was reported. The patient does not take any blood thinners. No SOB or chest pain or palpitations.  He does not smoke. No alcohol or recreational drugs. No recent long distance trips. He denies any hormonal therapy. No changes in his medications. Other than a recent cold about 2 weeks ago, no other infections reported. He denies any dysuria, or bowel incontinence. No lower extremity swelling. No falls. Patient  never had a similar episode. Denies any history of TIA. Denies vertigo dizziness  or seizures. No new stressors present in personal life. Patient is very active, exercising daily. He is not a diabetic. No family history of stroke. Patient was not administered TPA as is beyond time window for treatment consideration. Will admit for further evaluation and treatment.   ED Course:  BP (!) 213/129 (BP Location: Right Arm)   Pulse 105   Temp 98 F (36.7 C) (Oral)   Resp 14   SpO2 99%    CT of the head is negative for acute findings MRI of the brain is pending. Neurology is involved. Creatinine 1.3 at his baseline blood sugar 124 current blood pressure is to 13/129, as permissive hypertension is being allowed. Troponin 0.01. EKG with sinus rhythm, with PVCs, no significant changes since the prior EKG.    Review of Systems: As per HPI otherwise 10 point review of systems  negative.   Past Medical History:  Diagnosis Date  . Chronic renal insufficiency, stage III (moderate)    CrCl 40s  . Colon cancer ([place]) 1993  . Hypertension   . Lumbar spinal stenosis    Surgery 2012 helped but in 2015 pain has returned  . Osteoarthritis     Past Surgical History:  Procedure Laterality Date  . CHOLECYSTECTOMY  1984  . INGUINAL HERNIA REPAIR  1974   Bilat  . LUMBAR SPINE SURGERY  2012   for spinal stenosis (Dr[place] Jonelle [place]  . LUNG LOBECTOMY  1993   LLL per pt--worry of possible cancer but turned out to be benign.  [name] PARTIAL COLECTOMY  1993   for colon cancer    Social History Social History   Social History  . Marital status: Married    Spouse name: N/A  . Number of children: N/A  . Years of education: N/A   Occupational History  . Not on file.   Social History Main Topics  . Smoking status: Former Research scientist (life sciences)  . Smokeless tobacco: Never Used  . Alcohol use 0.6 - 1.2 oz/week    1 - 2 Cans of beer per week     Comment: most days  . Drug use: No  . Sexual activity: Not on file   Other Topics Concern  . Not on file   Social History Narrative   Married, 1 son and 1 daughter.   Education: BS in Pharmacologist.   Occupation:  Marketing (retired).   As of 01/2014, living at [name] with his wife.   No T/A/Ds.       No Known Allergies  Family History  Problem Relation Age of Onset  . Parkinson's disease Mother   . Heart attack Mother   . Cancer Brother   . Cancer Daughter       Prior to Admission medications   Medication Sig Start Date End Date Taking? Authorizing Provider  aspirin 325 MG tablet Take 325 mg by mouth every 6 (six) hours as needed for mild pain.    Yes Historical Provider, MD  atenolol (TENORMIN) 100 MG tablet Take 1/2 Tablets By Mouth Twice A Day Patient taking differently: Take 100 mg by mouth daily.  06/18/14  Yes Tammi Sou, MD  cholecalciferol (VITAMIN D) 1000 UNITS tablet Take 1,000 Units by mouth daily.    Yes Historical Provider, MD  HYDROcodone-acetaminophen (NORCO) 5-325 MG tablet Take 0.5 tablets by mouth every 6 (six) hours as needed for severe pain. 01/21/16  Yes Tammi Sou, MD  Hypromellose (ARTIFICIAL TEARS OP) Place 1 drop into both eyes daily.   Yes Historical Provider, MD  triamterene-hydrochlorothiazide (MAXZIDE) 75-50 MG per tablet Take 1 tablet by mouth daily. Patient taking differently: Take 0.5 tablets by mouth 2 (two) times daily.  06/18/14  Yes Tammi Sou, MD  vitamin C (ASCORBIC ACID) 500 MG tablet Take 500 mg by mouth daily.   Yes Historical Provider, MD  vitamin E 400 UNIT capsule Take 400 Units by mouth every other day.   Yes Historical Provider, MD  enalapril (VASOTEC) 5 MG tablet Take 1 tablet (5 mg total) by mouth 2 (two) times daily. Patient not taking: Reported on 03/14/2016 10/06/15   Tammi Sou, MD    Physical Exam:    Vitals:   03/14/16 1052 03/14/16 1101  BP:  (!) 213/129  Pulse:  105  Resp:  14  Temp:  98 F (36.7 C)  TempSrc:  Oral  SpO2: 97% 99%       Constitutional: NAD, calm, comfortable  Vitals:   03/14/16 1052 03/14/16 1101  BP:  (!) 213/129  Pulse:  105  Resp:  14  Temp:  98 F (36.7 C)  TempSrc:  Oral  SpO2: 97% 99%   Eyes: PERRL, lids and conjunctivae normal ENMT: Mucous membranes are moist. Posterior pharynx clear of any exudate or lesions.Normal dentition.  Neck: normal, supple, no masses, no thyromegaly Respiratory: clear to auscultation bilaterally, no wheezing, no crackles. Normal respiratory effort. No accessory muscle use.  Cardiovascular: Regular rate and rhythm, occasional ectopic beats no murmurs / rubs / gallops. No extremity edema. 2+ pedal pulses. No carotid bruits.  Abdomen: no tenderness, no masses palpated. No hepatosplenomegaly. Bowel sounds positive.  Musculoskeletal: no clubbing / cyanosis. No joint deformity upper and lower extremities. Good ROM, no contractures. Normal muscle tone.  Skin: no  rashes, lesions, ulcers.  Neurologic:  Other than R facial weakness with droop and slurred speech (improving per his own report) , rest of the neuro exam is normal Sensation intact, DTR normal. Strength 5/5 in all 4.  Psychiatric:  Normal judgment and insight. Alert and oriented x 3. Normal mood.     Labs on Admission: I have personally reviewed following labs and imaging studies  CBC:  Recent Labs Lab 03/14/16 1042 03/14/16 1047  WBC 8.2  --   NEUTROABS 5.4  --   HGB 14.9 15.3  HCT 44.2 45.0  MCV 95.1  --  PLT 183  --     Basic Metabolic Panel:  Recent Labs Lab 03/14/16 1042 03/14/16 1047  NA 139 139  K 4.0 3.9  CL 105 101  CO2 26  --   GLUCOSE 125* 124*  BUN 16 22*  CREATININE 1.28* 1.30*  CALCIUM 9.6  --     GFR: CrCl cannot be calculated (Unknown ideal weight.).  Liver Function Tests:  Recent Labs Lab 03/14/16 1042  AST 29  ALT 13*  ALKPHOS 53  BILITOT 1.2  PROT 7.2  ALBUMIN 3.9   No results for input(s): LIPASE, AMYLASE in the last 168 hours. No results for input(s): AMMONIA in the last 168 hours.  Coagulation Profile:  Recent Labs Lab 03/14/16 1042  INR 0.95    Cardiac Enzymes: No results for input(s): CKTOTAL, CKMB, CKMBINDEX, TROPONINI in the last 168 hours.  BNP (last 3 results) No results for input(s): PROBNP in the last 8760 hours.  HbA1C: No results for input(s): HGBA1C in the last 72 hours.  CBG:  Recent Labs Lab 03/14/16 1101  GLUCAP 115*    Lipid Profile: No results for input(s): CHOL, HDL, LDLCALC, TRIG, CHOLHDL, LDLDIRECT in the last 72 hours.  Thyroid Function Tests: No results for input(s): TSH, T4TOTAL, FREET4, T3FREE, THYROIDAB in the last 72 hours.  Anemia Panel: No results for input(s): VITAMINB12, FOLATE, FERRITIN, TIBC, IRON, RETICCTPCT in the last 72 hours.  Urine analysis: No results found for: COLORURINE, APPEARANCEUR, LABSPEC, PHURINE, GLUCOSEU, HGBUR, BILIRUBINUR, KETONESUR, PROTEINUR,  UROBILINOGEN, NITRITE, LEUKOCYTESUR  Sepsis Labs: @LABRCNTIP (procalcitonin:4,lacticidven:4) )No results found for this or any previous visit (from the past 240 hour(s)).   Radiological Exams on Admission: Ct Head Code Stroke W/o Cm  Result Date: 03/14/2016 CLINICAL DATA:  Code stroke. 82 year old hypertensive male with slurred speech and right facial droop. Symptoms are better. Initial encounter. EXAM: CT HEAD WITHOUT CONTRAST TECHNIQUE: Contiguous axial images were obtained from the base of the skull through the vertex without intravenous contrast. COMPARISON:  None. FINDINGS: Brain: No intracranial hemorrhage. Chronic microvascular changes without CT evidence of large acute infarct. Global atrophy without hydrocephalus. No intracranial mass lesion noted on this unenhanced exam. Vascular: Vascular calcifications with ectasia most notable involving the left internal carotid artery supraclinoid segment Skull: No acute abnormality. Sinuses/Orbits: No acute orbital abnormality post lens replacement Partial opacification anterior left ethmoid sinus air cell. Other: Degenerative changes temporal mandibular joint. ASPECTS [facility] Stroke Program Early CT Score) - Ganglionic level infarction (caudate, lentiform nuclei, internal capsule, insula, M1-M3 cortex): 7 - Supraganglionic infarction (M4-M6 cortex): 3 Total score (0-10 with 10 being normal): 10 IMPRESSION: 1. Chronic microvascular changes without CT evidence of large acute infarct. No intracranial hemorrhage. Atrophy. Vascular calcifications with ectasia most notable involving the left internal carotid artery supraclinoid segment 2. ASPECTS is 10 These results were called by telephone at the time of interpretation on 03/14/2016 at 11:01 am to Dr. Leonel Ramsay, who verbally acknowledged these results. Electronically Signed   By: Genia Del M.D.   On: 03/14/2016 11:05    EKG: Independently reviewed.  Assessment/Plan Active Problems:   Stroke-like  symptoms   Stroke like symptoms as evidence by R facial weakness/droop with slurred speech   Not a TPA candidate as per Neuro assessment  CT head head is negative. MRI brain  Pending     Admit to Tele obs  Stroke order set  MRI/MRA brain   MRI/MRA neck Allow permissive HTN Echo  PT/OT/SLP  lipid panel A1C Aspirin  Anticoagulation   Neuro  following, awaiting other recommendations   Hypertension BP  213/129  Controlled Hold  home anti-hypertensive medications to allow permissive hypertensive  Add Hydralazine Q6 hours as needed for BP 210/110  Chronic kidney disease stage  baseline creatinine 1.2    Current Cr 1.3  Lab Results  Component Value Date   CREATININE 1.30 (H) 03/14/2016   CREATININE 1.28 (H) 03/14/2016   CREATININE 1.03 10/06/2015  IVF Repeat CMET in am    DVT prophylaxis: Lovenox   Code Status:   Full    Family Communication:  Discussed with patient Disposition Plan: Expect patient to be discharged to home after condition improves Consults called:    None Admission status:Tele  Obs    Jayten Gabbard E, PA-C [facility]   03/14/2016, 1:47 PM

## 2016-03-14 NOTE — ED Provider Notes (Signed)
[facility] DEPT Provider Note   CSN: [id number] Arrival date & time: 03/14/16  1035     History   Chief Complaint Chief Complaint  Patient presents with  . Code Stroke    HPI Brendan Gonzalez is a 82 y.o. male.  HPI Patient is a 82 year old male with past medical history of hypertension, cardiac chronic renal deficiency, colon cancer who presents as a code stroke for slurred speech. Patient reports was going on for coffee with friends around 8:30 this morning when he was noted to have slurring of his words. However, he is unsure exactly when this started. This has improved slightly since then. He denies numbness, weakness or other changes. Denies headache. He does not take any blood thinners.  Past Medical History:  Diagnosis Date  . Chronic renal insufficiency, stage III (moderate)    CrCl 40s  . Colon cancer ([place]) 1993  . Hypertension   . Lumbar spinal stenosis    Surgery 2012 helped but in 2015 pain has returned  . Osteoarthritis     Patient Active Problem List   Diagnosis Date Noted  . Stroke-like symptoms 03/14/2016  . Acute bronchitis 01/27/2014  . URI (upper respiratory infection) 01/27/2014    Past Surgical History:  Procedure Laterality Date  . CHOLECYSTECTOMY  1984  . INGUINAL HERNIA REPAIR  1974   Bilat  . LUMBAR SPINE SURGERY  2012   for spinal stenosis (Dr. Jonelle Sports [place]  . LUNG LOBECTOMY  1993   LLL per pt--worry of possible cancer but turned out to be benign.  [name] PARTIAL COLECTOMY  1993   for colon cancer       Home Medications    Prior to Admission medications   Medication Sig Start Date End Date Taking? Authorizing Provider  aspirin 325 MG tablet Take 325 mg by mouth every 6 (six) hours as needed for mild pain.    Yes Historical Provider, MD  atenolol (TENORMIN) 100 MG tablet Take 1/2 Tablets By Mouth Twice A Day Patient taking differently: Take 100 mg by mouth daily.  06/18/14  Yes Tammi Sou, MD  cholecalciferol (VITAMIN D) 1000  UNITS tablet Take 1,000 Units by mouth daily.   Yes Historical Provider, MD  HYDROcodone-acetaminophen (NORCO) 5-325 MG tablet Take 0.5 tablets by mouth every 6 (six) hours as needed for severe pain. 01/21/16  Yes Tammi Sou, MD  Hypromellose (ARTIFICIAL TEARS OP) Place 1 drop into both eyes daily.   Yes Historical Provider, MD  triamterene-hydrochlorothiazide (MAXZIDE) 75-50 MG per tablet Take 1 tablet by mouth daily. Patient taking differently: Take 0.5 tablets by mouth 2 (two) times daily.  06/18/14  Yes Tammi Sou, MD  vitamin C (ASCORBIC ACID) 500 MG tablet Take 500 mg by mouth daily.   Yes Historical Provider, MD  vitamin E 400 UNIT capsule Take 400 Units by mouth every other day.   Yes Historical Provider, MD  enalapril (VASOTEC) 5 MG tablet Take 1 tablet (5 mg total) by mouth 2 (two) times daily. Patient not taking: Reported on 03/14/2016 10/06/15   Tammi Sou, MD    Family History Family History  Problem Relation Age of Onset  . Parkinson's disease Mother   . Heart attack Mother   . Cancer Brother   . Cancer Daughter     Social History Social History  Substance Use Topics  . Smoking status: Former Research scientist (life sciences)  . Smokeless tobacco: Never Used  . Alcohol use 0.6 - 1.2 oz/week  1 - 2 Cans of beer per week     Comment: most days     Allergies   Patient has no known allergies.   Review of Systems Review of Systems  Constitutional: Negative for chills and fever.  HENT: Negative for ear pain and sore throat.   Eyes: Negative for pain and visual disturbance.  Respiratory: Negative for cough and shortness of breath.   Cardiovascular: Negative for chest pain and palpitations.  Gastrointestinal: Negative for abdominal pain and vomiting.  Genitourinary: Negative for dysuria and hematuria.  Musculoskeletal: Negative for arthralgias and back pain.  Skin: Negative for color change and rash.  Neurological: Positive for facial asymmetry and speech difficulty. Negative  for tremors, seizures, syncope and weakness.  All other systems reviewed and are negative.    Physical Exam Updated Vital Signs BP (!) 189/118   Pulse 90   Temp 98 F (36.7 C) (Oral)   Resp 15   Wt 75.3 kg   SpO2 97%   BMI 27.20 kg/m   Physical Exam  Constitutional: He is oriented to person, place, and time. He appears well-developed and well-nourished.  HENT:  Head: Normocephalic and atraumatic.  Eyes: Conjunctivae are normal.  Neck: Neck supple.  Cardiovascular: Normal rate and regular rhythm.   No murmur heard. Pulmonary/Chest: Effort normal and breath sounds normal. No respiratory distress.  Abdominal: Soft. There is no tenderness.  Musculoskeletal: He exhibits no edema.  Neurological: He is alert and oriented to person, place, and time. He has normal strength and normal reflexes. No sensory deficit. Coordination normal.  Slurred speech. Right facial droop. 5/5 strength bilateral upper and lower extremities. Sensation intact to light touch.   Skin: Skin is warm and dry.  Psychiatric: He has a normal mood and affect.  Nursing note and vitals reviewed.    ED Treatments / Results  Labs (all labs ordered are listed, but only abnormal results are displayed) Labs Reviewed  COMPREHENSIVE METABOLIC PANEL - Abnormal; Notable for the following:       Result Value   Glucose, Bld 125 (*)    Creatinine, Ser 1.28 (*)    ALT 13 (*)    GFR calc non Af Amer 46 (*)    GFR calc Af Amer 53 (*)    All other components within normal limits  I-STAT CHEM 8, ED - Abnormal; Notable for the following:    BUN 22 (*)    Creatinine, Ser 1.30 (*)    Glucose, Bld 124 (*)    All other components within normal limits  CBG MONITORING, ED - Abnormal; Notable for the following:    Glucose-Capillary 115 (*)    All other components within normal limits  ETHANOL  PROTIME-INR  APTT  CBC  DIFFERENTIAL  RAPID URINE DRUG SCREEN, HOSP PERFORMED  URINALYSIS, ROUTINE W REFLEX MICROSCOPIC (NOT AT  [facility])  PROTIME-INR  APTT  TROPONIN I  RAPID URINE DRUG SCREEN, HOSP PERFORMED  HEMOGLOBIN A1C  LIPID PANEL  I-STAT TROPOININ, ED    EKG  EKG Interpretation  Date/Time:  Monday March 14 2016 10:58:56 EST Ventricular Rate:  100 PR Interval:    QRS Duration: 88 QT Interval:  356 QTC Calculation: 460 R Axis:   11 Text Interpretation:  Sinus rhythm with pvc's and pac's Low voltage, extremity leads Probable anteroseptal infarct, old Since last tracing rate faster Confirmed by KNOTT MD, DANIEL [phone]) on 03/14/2016 11:42:16 AM       Radiology Dg Chest 2 View  Result Date: 03/14/2016  CLINICAL DATA:  Right facial weakness and slurred speech. History of hypertension. EXAM: CHEST  2 VIEW COMPARISON:  11/15/2014 FINDINGS: Cardiac silhouette is borderline enlarged. No mediastinal or hilar masses. No evidence adenopathy. There are surgical vascular clips along the left hilum, stable. Mild stable linear scarring is noted in the lung bases. Lungs are otherwise clear. No pleural effusion or pneumothorax. Skeletal structures are diffusely demineralized but grossly intact. IMPRESSION: No acute cardiopulmonary disease. Electronically Signed   By: Lajean Manes M.D.   On: 03/14/2016 14:51   Ct Head Code Stroke W/o Cm  Result Date: 03/14/2016 CLINICAL DATA:  Code stroke. 82 year old hypertensive male with slurred speech and right facial droop. Symptoms are better. Initial encounter. EXAM: CT HEAD WITHOUT CONTRAST TECHNIQUE: Contiguous axial images were obtained from the base of the skull through the vertex without intravenous contrast. COMPARISON:  None. FINDINGS: Brain: No intracranial hemorrhage. Chronic microvascular changes without CT evidence of large acute infarct. Global atrophy without hydrocephalus. No intracranial mass lesion noted on this unenhanced exam. Vascular: Vascular calcifications with ectasia most notable involving the left internal carotid artery supraclinoid segment Skull: No acute  abnormality. Sinuses/Orbits: No acute orbital abnormality post lens replacement Partial opacification anterior left ethmoid sinus air cell. Other: Degenerative changes temporal mandibular joint. ASPECTS [facility] Stroke Program Early CT Score) - Ganglionic level infarction (caudate, lentiform nuclei, internal capsule, insula, M1-M3 cortex): 7 - Supraganglionic infarction (M4-M6 cortex): 3 Total score (0-10 with 10 being normal): 10 IMPRESSION: 1. Chronic microvascular changes without CT evidence of large acute infarct. No intracranial hemorrhage. Atrophy. Vascular calcifications with ectasia most notable involving the left internal carotid artery supraclinoid segment 2. ASPECTS is 10 These results were called by telephone at the time of interpretation on 03/14/2016 at 11:01 am to Dr. Leonel Ramsay, who verbally acknowledged these results. Electronically Signed   By: Genia Del M.D.   On: 03/14/2016 11:05    Procedures Procedures (including critical care time)  Medications Ordered in ED Medications  artificial tears (LACRILUBE) ophthalmic ointment (not administered)  atenolol (TENORMIN) tablet 100 mg (not administered)  triamterene-hydrochlorothiazide (MAXZIDE) 75-50 MG per tablet 0.5 tablet (not administered)  aspirin tablet 325 mg (not administered)   stroke: mapping our early stages of recovery book (not administered)  0.9 %  sodium chloride infusion (not administered)  acetaminophen (TYLENOL) tablet 650 mg (not administered)    Or  acetaminophen (TYLENOL) suppository 650 mg (not administered)  senna-docusate (Senokot-S) tablet 1 tablet (not administered)  heparin injection 5,000 Units (not administered)  hydrALAZINE (APRESOLINE) injection 5-10 mg (5 mg Intravenous Given 03/14/16 1725)  sodium chloride 0.9 % bolus 1,000 mL (0 mLs Intravenous Stopped 03/14/16 1253)  labetalol (NORMODYNE,TRANDATE) injection 10 mg (10 mg Intravenous Given 03/14/16 1246)     Initial Impression / Assessment and  Plan / ED Course  I have reviewed the triage vital signs and the nursing notes.  Pertinent labs & imaging results that were available during my care of the patient were reviewed by me and considered in my medical decision making (see chart for details).  Clinical Course    Patient is a 83 old male with past dental history as above who presents as a code stroke for slurred speech and right facial droop. Code stroke activated prior to patient arrival. Patient was taken immediately to the CT scanner. CT head does not show acute bleed. Patient was evaluated by neurology and is not a candidate for TPA as time went is uncertain. Patient was hypertensive with systolics in the  180s to 190s following CT scan. IV labetalol given once. Stroke labs unremarkable. Patient will be admitted to the hospitalist for further stroke rule out and MRI.  Patient care supervised by Dr. Laneta Simmers, ED attending  Final Clinical Impressions(s) / ED Diagnoses   Final diagnoses:  Slurred speech    New Prescriptions New Prescriptions   No medications on file     Gibson Ramp, MD 03/14/16 1755    Leo Grosser, MD 03/14/16 1949

## 2016-03-14 NOTE — Progress Notes (Addendum)
Patient arrived from the ED to 5M05. Safety precautions and orders reviewed with patient/family. TELE applied and confirmed. MD paged. Pt aware of being NPO at this time. Swallow eval order at this time. Family at bedside. Pt/family voice that pt does have "small metal fragment" imbedded within stomach/chest area. No other distress noted. Will continue to monitor.    Ave Filter, RN

## 2016-03-15 ENCOUNTER — Observation Stay (HOSPITAL_BASED_OUTPATIENT_CLINIC_OR_DEPARTMENT_OTHER): Payer: Medicare Other

## 2016-03-15 DIAGNOSIS — N183 Chronic kidney disease, stage 3 (moderate): Secondary | ICD-10-CM | POA: Diagnosis not present

## 2016-03-15 DIAGNOSIS — Z7982 Long term (current) use of aspirin: Secondary | ICD-10-CM | POA: Diagnosis not present

## 2016-03-15 DIAGNOSIS — R4781 Slurred speech: Secondary | ICD-10-CM

## 2016-03-15 DIAGNOSIS — Z79899 Other long term (current) drug therapy: Secondary | ICD-10-CM | POA: Diagnosis not present

## 2016-03-15 DIAGNOSIS — R471 Dysarthria and anarthria: Secondary | ICD-10-CM | POA: Diagnosis present

## 2016-03-15 DIAGNOSIS — I63412 Cerebral infarction due to embolism of left middle cerebral artery: Principal | ICD-10-CM

## 2016-03-15 DIAGNOSIS — E785 Hyperlipidemia, unspecified: Secondary | ICD-10-CM | POA: Diagnosis present

## 2016-03-15 DIAGNOSIS — M1990 Unspecified osteoarthritis, unspecified site: Secondary | ICD-10-CM | POA: Diagnosis present

## 2016-03-15 DIAGNOSIS — I6789 Other cerebrovascular disease: Secondary | ICD-10-CM

## 2016-03-15 DIAGNOSIS — I639 Cerebral infarction, unspecified: Secondary | ICD-10-CM | POA: Diagnosis not present

## 2016-03-15 DIAGNOSIS — Z9049 Acquired absence of other specified parts of digestive tract: Secondary | ICD-10-CM | POA: Diagnosis not present

## 2016-03-15 DIAGNOSIS — Z85038 Personal history of other malignant neoplasm of large intestine: Secondary | ICD-10-CM | POA: Diagnosis not present

## 2016-03-15 DIAGNOSIS — I10 Essential (primary) hypertension: Secondary | ICD-10-CM | POA: Diagnosis not present

## 2016-03-15 DIAGNOSIS — R29810 Facial weakness: Secondary | ICD-10-CM | POA: Diagnosis present

## 2016-03-15 DIAGNOSIS — I6340 Cerebral infarction due to embolism of unspecified cerebral artery: Secondary | ICD-10-CM | POA: Insufficient documentation

## 2016-03-15 DIAGNOSIS — I160 Hypertensive urgency: Secondary | ICD-10-CM | POA: Diagnosis present

## 2016-03-15 DIAGNOSIS — I129 Hypertensive chronic kidney disease with stage 1 through stage 4 chronic kidney disease, or unspecified chronic kidney disease: Secondary | ICD-10-CM | POA: Diagnosis present

## 2016-03-15 DIAGNOSIS — Z87891 Personal history of nicotine dependence: Secondary | ICD-10-CM | POA: Diagnosis not present

## 2016-03-15 HISTORY — PX: TRANSTHORACIC ECHOCARDIOGRAM: SHX275

## 2016-03-15 LAB — VAS US CAROTID
LCCADSYS: 41 cm/s
LCCAPDIAS: 7 cm/s
LCCAPSYS: 75 cm/s
LEFT ECA DIAS: -7 cm/s
LEFT VERTEBRAL DIAS: 11 cm/s
LICADDIAS: -18 cm/s
LICADSYS: -76 cm/s
LICAPDIAS: -14 cm/s
LICAPSYS: -54 cm/s
Left CCA dist dias: 9 cm/s
RCCAPSYS: 48 cm/s
RIGHT ECA DIAS: -5 cm/s
RIGHT VERTEBRAL DIAS: 14 cm/s
Right CCA prox dias: 7 cm/s
Right cca dist sys: -60 cm/s

## 2016-03-15 LAB — ECHOCARDIOGRAM COMPLETE: WEIGHTICAEL: 2656.1 [oz_av]

## 2016-03-15 LAB — LIPID PANEL
Cholesterol: 192 mg/dL (ref 0–200)
HDL: 82 mg/dL (ref >40)
LDL CALC: 96 mg/dL (ref 0–99)
Total CHOL/HDL Ratio: 2.3 RATIO
Triglycerides: 71 mg/dL (ref <150)
VLDL: 14 mg/dL (ref 0–40)

## 2016-03-15 LAB — COMPREHENSIVE METABOLIC PANEL
ALT: 14 U/L — ABNORMAL LOW (ref 17–63)
ANION GAP: 10 (ref 5–15)
AST: 28 U/L (ref 15–41)
Albumin: 3.6 g/dL (ref 3.5–5.0)
Alkaline Phosphatase: 52 U/L (ref 38–126)
BILIRUBIN TOTAL: 1.8 mg/dL — AB (ref 0.3–1.2)
BUN: 11 mg/dL (ref 6–20)
CHLORIDE: 102 mmol/L (ref 101–111)
CO2: 25 mmol/L (ref 22–32)
Calcium: 9.2 mg/dL (ref 8.9–10.3)
Creatinine, Ser: 1.08 mg/dL (ref 0.61–1.24)
GFR calc Af Amer: >60 mL/min (ref >60)
GFR, EST NON AFRICAN AMERICAN: 57 mL/min — AB (ref >60)
Glucose, Bld: 130 mg/dL — ABNORMAL HIGH (ref 65–99)
POTASSIUM: 3.4 mmol/L — AB (ref 3.5–5.1)
Sodium: 137 mmol/L (ref 135–145)
TOTAL PROTEIN: 6.7 g/dL (ref 6.5–8.1)

## 2016-03-15 LAB — LIPASE, BLOOD: LIPASE: 21 U/L (ref 11–51)

## 2016-03-15 MED ORDER — HYDRALAZINE HCL 20 MG/ML IJ SOLN
5.0000 mg | Freq: Three times a day (TID) | INTRAMUSCULAR | Status: DC | PRN
  Administered 2016-03-15: 5 mg via INTRAVENOUS
  Filled 2016-03-15: qty 1

## 2016-03-15 MED ORDER — ENALAPRIL MALEATE 5 MG PO TABS
5.0000 mg | ORAL_TABLET | Freq: Every day | ORAL | Status: DC
  Administered 2016-03-15 – 2016-03-16 (×2): 5 mg via ORAL
  Filled 2016-03-15 (×2): qty 1

## 2016-03-15 MED ORDER — ONDANSETRON HCL 4 MG/2ML IJ SOLN
4.0000 mg | Freq: Four times a day (QID) | INTRAMUSCULAR | Status: DC | PRN
  Filled 2016-03-15: qty 2

## 2016-03-15 MED ORDER — LISINOPRIL 5 MG PO TABS: 5.0000 mg | ORAL_TABLET | Freq: Every day | ORAL | Status: DC

## 2016-03-15 MED ORDER — ATORVASTATIN CALCIUM 10 MG PO TABS
10.0000 mg | ORAL_TABLET | Freq: Every day | ORAL | Status: DC
  Administered 2016-03-15 – 2016-03-16 (×2): 10 mg via ORAL
  Filled 2016-03-15 (×2): qty 1

## 2016-03-15 MED ORDER — CLOPIDOGREL BISULFATE 75 MG PO TABS
75.0000 mg | ORAL_TABLET | Freq: Every day | ORAL | Status: DC
  Administered 2016-03-16: 75 mg via ORAL
  Filled 2016-03-15: qty 1

## 2016-03-15 MED ORDER — TRIAMTERENE-HCTZ 37.5-25 MG PO TABS: 1.0000 | ORAL_TABLET | Freq: Every day | ORAL | Status: DC

## 2016-03-15 MED ORDER — ASPIRIN 325 MG PO TABS
325.0000 mg | ORAL_TABLET | Freq: Every day | ORAL | Status: DC
  Administered 2016-03-15: 325 mg via ORAL
  Filled 2016-03-15: qty 1

## 2016-03-15 MED ORDER — POTASSIUM CHLORIDE IN NACL 40-0.9 MEQ/L-% IV SOLN
INTRAVENOUS | Status: DC
  Administered 2016-03-15: 75 mL/h via INTRAVENOUS
  Filled 2016-03-15 (×3): qty 1000

## 2016-03-15 NOTE — Progress Notes (Signed)
STROKE TEAM PROGRESS NOTE   HISTORY OF PRESENT ILLNESS (per record) Brendan Gonzalez is a 82 y.o. male who was in his normal state of health this morning when he developed right facial weakness and numbness. He noticed this when he was discussing with his wife noticed that he had slurred speech. First states he had spoken to her room 7 AM 03/14/2016 and said had been normal at that time (LKW), however story was unclear and inconsistent, time LKW kept changing. Poor history. Patient was not administered IV t-PA secondary to unclear time of osnet. He was admitted for further evaluation and treatment.   SUBJECTIVE (INTERVAL HISTORY) No family at bedside. Patient is lying in bed, awake alert, fully orientated, stated that he was found to have right facial droop and slurry speech in the morning with friends, however, he was not sure when this happened. He did not get TPA due to an clear time onset. He denies any heart palpitation, or history of heart disease.   OBJECTIVE Temp:  [97.6 F (36.4 C)-98.8 F (37.1 C)] 97.6 F (36.4 C) (12/05 1006) Pulse Rate:  [84-99] 99 (12/05 1006) Cardiac Rhythm: Normal sinus rhythm (12/04 2031) Resp:  [12-20] 18 (12/05 1006) BP: (93-224)/(64-137) 93/64 (12/05 1006) SpO2:  [96 %-100 %] 97 % (12/05 1006) Weight:  [75.3 kg (166 lb 0.1 oz)] 75.3 kg (166 lb 0.1 oz) (12/04 1514)  CBC:  Recent Labs Lab 03/14/16 1042 03/14/16 1047  WBC 8.2  --   NEUTROABS 5.4  --   HGB 14.9 15.3  HCT 44.2 45.0  MCV 95.1  --   PLT 183  --     Basic Metabolic Panel:  Recent Labs Lab 03/14/16 1042 03/14/16 1047 03/15/16 0844  NA 139 139 137  K 4.0 3.9 3.4*  CL 105 101 102  CO2 26  --  25  GLUCOSE 125* 124* 130*  BUN 16 22* 11  CREATININE 1.28* 1.30* 1.08  CALCIUM 9.6  --  9.2    Lipid Panel:    Component Value Date/Time   CHOL 192 03/15/2016 0324   TRIG 71 03/15/2016 0324   HDL 82 03/15/2016 0324   CHOLHDL 2.3 03/15/2016 0324   VLDL 14 03/15/2016 0324   LDLCALC  96 03/15/2016 0324   HgbA1c: No results found for: HGBA1C Urine Drug Screen:    Component Value Date/Time   LABOPIA NONE DETECTED 03/14/2016 1327   COCAINSCRNUR NONE DETECTED 03/14/2016 1327   LABBENZ NONE DETECTED 03/14/2016 1327   AMPHETMU NONE DETECTED 03/14/2016 1327   THCU NONE DETECTED 03/14/2016 1327   LABBARB NONE DETECTED 03/14/2016 1327      IMAGING I have personally reviewed the radiological images below and agree with the radiology interpretations.  Dg Chest 2 View 03/14/2016 No acute cardiopulmonary disease.   Ct Head Code Stroke W/o Cm 03/14/2016 1. Chronic microvascular changes without CT evidence of large acute infarct. No intracranial hemorrhage. Atrophy. Vascular calcifications with ectasia most notable involving the left internal carotid artery supraclinoid segment 2. ASPECTS is 10   MRI HEAD 03/14/2016 Small acute LEFT frontal lobe nonhemorrhagic infarct, MCA territory. Moderate chronic small vessel ischemic disease.   MRA HEAD 03/14/2016 No emergent large vessel occlusion or severe stenosis. Dolichoectasia compatible chronic hypertension.   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 Distal LEFT cervical internal carotid artery dolichoectasia without focal aneurysm. No hemodynamically significant stenosis.   Carotid Doppler  There is 1-39% bilateral ICA stenosis. Vertebral artery flow is antegrade.    2D Echocardiogram  Left ventricle: The cavity size was normal. Systolic function was   normal. Wall motion was normal; there were no regional wall   motion abnormalities. There was an increased relative   contribution of atrial contraction to ventricular filling.   Doppler parameters are consistent with abnormal left ventricular   relaxation (grade 1 diastolic dysfunction). - Aortic valve: There was trivial regurgitation. - Aorta:  Aortic root dimension: 45 mm (ED). Ascending aortic   diameter: 38 mm (S). - Aortic root: The aortic root was moderately dilated. - Ascending aorta: The ascending aorta was mildly dilated.   PHYSICAL EXAM Physical exam  Temp:  [97.6 F (36.4 C)-98.8 F (37.1 C)] 98.8 F (37.1 C) (12/05 1725) Pulse Rate:  [68-99] 70 (12/05 1725) Resp:  [16-18] 18 (12/05 1725) BP: (93-200)/(57-118) 146/75 (12/05 1725) SpO2:  [97 %-100 %] 100 % (12/05 1725)  General - Well nourished, well developed, in no apparent distress.  Ophthalmologic -  Fundi not visualized due to eye movement.  Cardiovascular - Regular rate and rhythm.  Mental Status -  Level of arousal and orientation to time, place, and person were intact. Language including expression, naming, repetition, comprehension was assessed and found intact. Fund of Knowledge was assessed and was intact.  Cranial Nerves II - XII - II - Visual field intact OU. III, IV, VI - Extraocular movements intact. V - Facial sensation intact bilaterally. VII - right facial droop. VIII - Hearing & vestibular intact bilaterally. X - Palate elevates symmetrically, mild dysarthria. XI - Chin turning & shoulder shrug intact bilaterally. XII - Tongue protrusion intact.  Motor Strength - The patient's strength was normal in all extremities and pronator drift was absent.  Bulk was normal and fasciculations were absent.   Motor Tone - Muscle tone was assessed at the neck and appendages and was normal.  Reflexes - The patient's reflexes were 1+ in all extremities and he had no pathological reflexes.  Sensory - Light touch, temperature/pinprick were assessed and were symmetrical.    Coordination - The patient had normal movements in the hands with no ataxia or dysmetria.  Tremor was absent.  Gait and Station - deferred due to safety concerns   ASSESSMENT/PLAN Brendan Gonzalez is a 82 y.o. male with history of Hypertension, CKD 3, remote cancer presenting  with R facial weakness. He did not receive IV t-PA due to unclear LKW.   Stroke:  left frontal infarct embolic secondary to unknown source. Concerning for cardioembolic source  Resultant  right facial droop  MRI  left frontal infarct. Small vessel disease  MRA head  no significant stenosis  MRA neck no significant stenosis  Carotid Doppler unremarkable   2D Echo  unremarkable, no SOE  TEE requested, given advanced age, cardiology decline the request.   [place] electrophysiologist will consult and consider placement of an implantable loop recorder to evaluate for atrial fibrillation as etiology of stroke. This has been explained to patient/family by Dr. Erlinda Hong and they are agreeable.   LDL 96  HgbA1c pending  Heparin 5000 units sq tid for VTE prophylaxis  Diet Heart Room service appropriate? Yes; Fluid consistency: Thin  aspirin 325 mg daily prior to admission, now on aspirin 325 mg daily. Recommend to switch to Plavix for further stroke prevention.  Patient counseled to be compliant with his antithrombotic medications  Ongoing aggressive stroke risk factor management  Therapy recommendations:  No OT, HH PT  Disposition:  pending   Hypertensive Urgency  BP  As high as 213/129 in setting of neurologi symptoms   Now low 93/67   Permissive hypertension (OK if < 220/120) but gradually normalize in 5-7 days  Long-term BP goal normotensive  Avoid hypotension  Hyperlipidemia  Home meds:  lipitor 10, resumed in hospital  LDL 96, goal < 70  Continue statin at discharge  Other Stroke Risk Factors  Advanced age  ETOH use, advised to drink no more than 2 drink(s) a day  Other Active Problems  CKD stage III  [facility] day # 0  Brendan Hawking, MD PhD Stroke Neurology 03/15/2016 6:23 PM   To contact Stroke Continuity provider, please refer to [email or web address] After hours, contact General Neurology

## 2016-03-15 NOTE — Progress Notes (Signed)
[facility] PROGRESS NOTE  WOODS MASCOLA [id number] DOB: 1919-05-28 DOA: 03/14/2016   PCP: Tammi Sou, MD     Assessment/Plan: Active Problems:   Stroke-like symptoms   Slurred speech   Essential hypertension   Stage 3 chronic kidney disease    82 y.o. male with medical history significant  Hypertension, CKD 3, a history of remote: cancer, presenting to the emergency department with right facial weakness, and slurred speech. Patient found to be extremely hypertensive. MRI shows MCA territory infarcts   Assessment and plan Acute Left MCA territory infarct on MRI likely the setting of hypertensive urgency MRA HEAD: No emergent large vessel occlusion or severe stenosis. 2-D echo pending Carotid Doppler pending Currently on aspirin 325, may need to switch to Plavix? Lipid panel- LDL 96, initiate statin PT/OT/SLP [regular diet thin liquids]  Hypertensive urgency-continue atenolol Hold Maxide, can resume Vasotec  Chronic kidney disease stage  baseline creatinine 1.2    Current Cr 1.3    Nausea, liver function appears normal, patient also has some diarrhea, possible viral gastroenteritis Start patient on Maalox, Zofran    DVT prophylaxsis Lovenox   Code Status:  Full code     Code Status Orders      Family Communication: Discussed in detail with the patient, all imaging results, lab results explained to the patient   Disposition Plan:  1-2 days      Consultants:  Neurology    Procedures:  None  Antibiotics: Anti-infectives    None         HPI/Subjective: Couple of loose stools, nausea otherwise no gross focal deficits  Objective: Vitals:   03/15/16 0000 03/15/16 0145 03/15/16 0403 03/15/16 0600  BP: (!) 191/108 (!) 200/118 (!) 181/116 (!) 168/93  Pulse: 89 88 89 89  Resp: 18 18 18 16   Temp:      TempSrc:      SpO2: 97%     Weight:        Intake/Output Summary (Last 24 hours) at 03/15/16 0835 Last data filed at  03/15/16 0403  Gross per 24 hour  Intake                0 ml  Output              900 ml  Net             -900 ml    Exam:  Examination:  General exam: Appears calm and comfortable  Respiratory system: Clear to auscultation. Respiratory effort normal. Cardiovascular system: S1 & S2 heard, RRR. No JVD, murmurs, rubs, gallops or clicks. No pedal edema. Gastrointestinal system: Abdomen is nondistended, soft and nontender. No organomegaly or masses felt. Normal bowel sounds heard. Central nervous system: Alert and oriented. No focal neurological deficits. Extremities: Symmetric 5 x 5 power. Skin: No rashes, lesions or ulcers Psychiatry: Judgement and insight appear normal. Mood & affect appropriate.     Data Reviewed: I have personally reviewed following labs and imaging studies  Micro Results No results found for this or any previous visit (from the past 240 hour(s)).  Radiology Reports Dg Chest 2 View  Result Date: 03/14/2016 CLINICAL DATA:  Right facial weakness and slurred speech. History of hypertension. EXAM: CHEST  2 VIEW COMPARISON:  11/15/2014 FINDINGS: Cardiac silhouette is borderline enlarged. No mediastinal or hilar masses. No evidence adenopathy. There are surgical vascular clips along the left hilum, stable. Mild stable linear scarring is noted in the lung bases.  Lungs are otherwise clear. No pleural effusion or pneumothorax. Skeletal structures are diffusely demineralized but grossly intact. IMPRESSION: No acute cardiopulmonary disease. Electronically Signed   By: Lajean Manes M.D.   On: 03/14/2016 14:51   Mr Angiogram Neck W Contrast  Result Date: 03/14/2016 CLINICAL DATA:  RIGHT facial weakness and slurred speech beginning at breakfast. History of colon cancer, hypertension and chronic kidney disease. EXAM: MRI HEAD WITH AND WITHOUT CONTRAST MRA HEAD WITHOUT CONTRAST MRA NECK WITHOUT AND WITH CONTRAST TECHNIQUE: Multiplanar, multiecho pulse sequences of the brain and  surrounding structures were obtained with and without intravenous contrast. Angiographic images of the Circle of Willis were obtained using MRA technique without intravenous contrast. Angiographic images of the neck were obtained using MRA technique without and with intravenous contrast. Carotid stenosis measurements (when applicable) are obtained utilizing NASCET criteria, using the distal internal carotid diameter as the denominator. CONTRAST:  78mL MULTIHANCE GADOBENATE DIMEGLUMINE 529 MG/ML IV SOLN COMPARISON:  CT HEAD March 14, 2016 at 1044 hours FINDINGS: MRI HEAD FINDINGS INTRACRANIAL CONTENTS: 19 x 6 mm area of reduced diffusion LEFT posterior frontal lobe/ precentral cortex to subcortical white matter with low ADC values and bright FLAIR T2 hyperintense signal. No susceptibility artifact to suggest hemorrhage. The ventricles and sulci are normal for patient's age. No suspicious parenchymal signal, masses, mass effect. Confluent supratentorial white matter FLAIR T2 hyperintensities. 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 No abnormal intraparenchymal or extra-axial enhancement. No abnormal extra-axial fluid collections. No extra-axial masses. VASCULAR: Normal major intracranial vascular flow voids present at skull base. SKULL AND UPPER CERVICAL SPINE: No abnormal sellar expansion. No suspicious calvarial bone marrow signal. Craniocervical junction maintained. Pannus posterior to the odontoid process partially effaces the ventral cerebral spinal fluid spaces, compatible with CPPD. Acute probable discogenic endplate changes 075-GRM, partially imaged. SINUSES/ORBITS: The mastoid air-cells and included paranasal sinuses are well-aerated. Status post bilateral ocular lens implants. The included ocular globes and orbital contents are non-suspicious. OTHER: None. MRA HEAD FINDINGS ANTERIOR CIRCULATION: Arch vessel origins not included in field of view. Normal flow related enhancement of the included cervical, petrous,  cavernous and supraclinoid internal carotid arteries. Patent anterior communicating artery. Normal flow related enhancement of the anterior and middle cerebral arteries, including distal segments. Mild dolichoectasia. No large vessel occlusion, high-grade stenosis, abnormal luminal irregularity, aneurysm. POSTERIOR CIRCULATION: Codominant vertebral artery's. Basilar artery is patent, with normal flow related enhancement of the main branch vessels. Normal flow related enhancement of the posterior cerebral arteries. Small dolichoectasia. Mild spurious stenosis bilateral P2 segments not present on source images compatible with tortuosity. No large vessel occlusion, high-grade stenosis, abnormal luminal irregularity, aneurysm. MRA NECK FINDINGS ANTERIOR CIRCULATION: The common carotid arteries are widely patent bilaterally. The carotid bifurcation is patent bilaterally and there is no hemodynamically significant carotid stenosis by NASCET criteria. Luminal irregularity LEFT lateral mid cervical internal carotid artery with patulous vessel distally. No discrete dissection flap. No evidence for atherosclerosis or flow limiting stenosis of the cervical internal carotid arteries. POSTERIOR CIRCULATION: Bilateral vertebral arteries are patent to the vertebrobasilar junction. No evidence for atherosclerosis or flow limiting stenosis. Source images and MIP image were reviewed. 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 Moderate chronic small vessel ischemic disease. MRA HEAD: No emergent large vessel occlusion or severe stenosis. Dolichoectasia compatible chronic hypertension. 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 Distal LEFT cervical internal carotid artery dolichoectasia without focal aneurysm. No  hemodynamically significant stenosis. Electronically Signed   By:  Elon Alas M.D.   On: 03/14/2016 23:42   Mr Jeri Cos [id number] Contrast  Result Date: 03/14/2016 CLINICAL DATA:  RIGHT facial weakness and slurred speech beginning at breakfast. History of colon cancer, hypertension and chronic kidney disease. EXAM: MRI HEAD WITH AND WITHOUT CONTRAST MRA HEAD WITHOUT CONTRAST MRA NECK WITHOUT AND WITH CONTRAST TECHNIQUE: Multiplanar, multiecho pulse sequences of the brain and surrounding structures were obtained with and without intravenous contrast. Angiographic images of the Circle of Willis were obtained using MRA technique without intravenous contrast. Angiographic images of the neck were obtained using MRA technique without and with intravenous contrast. Carotid stenosis measurements (when applicable) are obtained utilizing NASCET criteria, using the distal internal carotid diameter as the denominator. CONTRAST:  74mL MULTIHANCE GADOBENATE DIMEGLUMINE 529 MG/ML IV SOLN COMPARISON:  CT HEAD March 14, 2016 at 1044 hours FINDINGS: MRI HEAD FINDINGS INTRACRANIAL CONTENTS: 19 x 6 mm area of reduced diffusion LEFT posterior frontal lobe/ precentral cortex to subcortical white matter with low ADC values and bright FLAIR T2 hyperintense signal. No susceptibility artifact to suggest hemorrhage. The ventricles and sulci are normal for patient's age. No suspicious parenchymal signal, masses, mass effect. Confluent supratentorial white matter FLAIR T2 hyperintensities. 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 No abnormal intraparenchymal or extra-axial enhancement. No abnormal extra-axial fluid collections. No extra-axial masses. VASCULAR: Normal major intracranial vascular flow voids present at skull base. SKULL AND UPPER CERVICAL SPINE: No abnormal sellar expansion. No suspicious calvarial bone marrow signal. Craniocervical junction maintained. Pannus posterior to the odontoid process partially effaces the ventral cerebral spinal fluid spaces, compatible with CPPD. Acute probable  discogenic endplate changes 075-GRM, partially imaged. SINUSES/ORBITS: The mastoid air-cells and included paranasal sinuses are well-aerated. Status post bilateral ocular lens implants. The included ocular globes and orbital contents are non-suspicious. OTHER: None. MRA HEAD FINDINGS ANTERIOR CIRCULATION: Arch vessel origins not included in field of view. Normal flow related enhancement of the included cervical, petrous, cavernous and supraclinoid internal carotid arteries. Patent anterior communicating artery. Normal flow related enhancement of the anterior and middle cerebral arteries, including distal segments. Mild dolichoectasia. No large vessel occlusion, high-grade stenosis, abnormal luminal irregularity, aneurysm. POSTERIOR CIRCULATION: Codominant vertebral artery's. Basilar artery is patent, with normal flow related enhancement of the main branch vessels. Normal flow related enhancement of the posterior cerebral arteries. Small dolichoectasia. Mild spurious stenosis bilateral P2 segments not present on source images compatible with tortuosity. No large vessel occlusion, high-grade stenosis, abnormal luminal irregularity, aneurysm. MRA NECK FINDINGS ANTERIOR CIRCULATION: The common carotid arteries are widely patent bilaterally. The carotid bifurcation is patent bilaterally and there is no hemodynamically significant carotid stenosis by NASCET criteria. Luminal irregularity LEFT lateral mid cervical internal carotid artery with patulous vessel distally. No discrete dissection flap. No evidence for atherosclerosis or flow limiting stenosis of the cervical internal carotid arteries. POSTERIOR CIRCULATION: Bilateral vertebral arteries are patent to the vertebrobasilar junction. No evidence for atherosclerosis or flow limiting stenosis. Source images and MIP image were reviewed. 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 Moderate chronic small vessel ischemic disease.  MRA HEAD: No emergent large vessel occlusion or severe stenosis. Dolichoectasia compatible chronic hypertension. 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 Distal LEFT cervical internal carotid artery dolichoectasia without focal aneurysm. No hemodynamically significant stenosis. Electronically Signed   By: Elon Alas M.D.   On: 03/14/2016 23:42  Mr Jodene Nam Head/brain [id number] Cm  Result Date: 03/14/2016 CLINICAL DATA:  RIGHT facial weakness and slurred speech beginning at breakfast. History of colon cancer, hypertension and chronic kidney disease. EXAM: MRI HEAD WITH AND WITHOUT CONTRAST MRA HEAD WITHOUT CONTRAST MRA NECK WITHOUT AND WITH CONTRAST TECHNIQUE: Multiplanar, multiecho pulse sequences of the brain and surrounding structures were obtained with and without intravenous contrast. Angiographic images of the Circle of Willis were obtained using MRA technique without intravenous contrast. Angiographic images of the neck were obtained using MRA technique without and with intravenous contrast. Carotid stenosis measurements (when applicable) are obtained utilizing NASCET criteria, using the distal internal carotid diameter as the denominator. CONTRAST:  3mL MULTIHANCE GADOBENATE DIMEGLUMINE 529 MG/ML IV SOLN COMPARISON:  CT HEAD March 14, 2016 at 1044 hours FINDINGS: MRI HEAD FINDINGS INTRACRANIAL CONTENTS: 19 x 6 mm area of reduced diffusion LEFT posterior frontal lobe/ precentral cortex to subcortical white matter with low ADC values and bright FLAIR T2 hyperintense signal. No susceptibility artifact to suggest hemorrhage. The ventricles and sulci are normal for patient's age. No suspicious parenchymal signal, masses, mass effect. Confluent supratentorial white matter FLAIR T2 hyperintensities. 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 No abnormal intraparenchymal or extra-axial enhancement. No  abnormal extra-axial fluid collections. No extra-axial masses. VASCULAR: Normal major intracranial vascular flow voids present at skull base. SKULL AND UPPER CERVICAL SPINE: No abnormal sellar expansion. No suspicious calvarial bone marrow signal. Craniocervical junction maintained. Pannus posterior to the odontoid process partially effaces the ventral cerebral spinal fluid spaces, compatible with CPPD. Acute probable discogenic endplate changes 075-GRM, partially imaged. SINUSES/ORBITS: The mastoid air-cells and included paranasal sinuses are well-aerated. Status post bilateral ocular lens implants. The included ocular globes and orbital contents are non-suspicious. OTHER: None. MRA HEAD FINDINGS ANTERIOR CIRCULATION: Arch vessel origins not included in field of view. Normal flow related enhancement of the included cervical, petrous, cavernous and supraclinoid internal carotid arteries. Patent anterior communicating artery. Normal flow related enhancement of the anterior and middle cerebral arteries, including distal segments. Mild dolichoectasia. No large vessel occlusion, high-grade stenosis, abnormal luminal irregularity, aneurysm. POSTERIOR CIRCULATION: Codominant vertebral artery's. Basilar artery is patent, with normal flow related enhancement of the main branch vessels. Normal flow related enhancement of the posterior cerebral arteries. Small dolichoectasia. Mild spurious stenosis bilateral P2 segments not present on source images compatible with tortuosity. No large vessel occlusion, high-grade stenosis, abnormal luminal irregularity, aneurysm. MRA NECK FINDINGS ANTERIOR CIRCULATION: The common carotid arteries are widely patent bilaterally. The carotid bifurcation is patent bilaterally and there is no hemodynamically significant carotid stenosis by NASCET criteria. Luminal irregularity LEFT lateral mid cervical internal carotid artery with patulous vessel distally. No discrete dissection flap. No evidence for  atherosclerosis or flow limiting stenosis of the cervical internal carotid arteries. POSTERIOR CIRCULATION: Bilateral vertebral arteries are patent to the vertebrobasilar junction. No evidence for atherosclerosis or flow limiting stenosis. Source images and MIP image were reviewed. 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 Moderate chronic small vessel ischemic disease. MRA HEAD: No emergent large vessel occlusion or severe stenosis. Dolichoectasia compatible chronic hypertension. 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 Distal LEFT cervical internal carotid artery dolichoectasia without focal aneurysm. No hemodynamically significant stenosis. Electronically Signed   By: Elon Alas M.D.   On: 03/14/2016 23:42   Ct Head Code Stroke W/o Cm  Result Date: 03/14/2016 CLINICAL DATA:  Code stroke. 82 year old hypertensive male with  slurred speech and right facial droop. Symptoms are better. Initial encounter. EXAM: CT HEAD WITHOUT CONTRAST TECHNIQUE: Contiguous axial images were obtained from the base of the skull through the vertex without intravenous contrast. COMPARISON:  None. FINDINGS: Brain: No intracranial hemorrhage. Chronic microvascular changes without CT evidence of large acute infarct. Global atrophy without hydrocephalus. No intracranial mass lesion noted on this unenhanced exam. Vascular: Vascular calcifications with ectasia most notable involving the left internal carotid artery supraclinoid segment Skull: No acute abnormality. Sinuses/Orbits: No acute orbital abnormality post lens replacement Partial opacification anterior left ethmoid sinus air cell. Other: Degenerative changes temporal mandibular joint. ASPECTS [facility] Stroke Program Early CT Score) - Ganglionic level infarction (caudate, lentiform nuclei, internal capsule, insula,  M1-M3 cortex): 7 - Supraganglionic infarction (M4-M6 cortex): 3 Total score (0-10 with 10 being normal): 10 IMPRESSION: 1. Chronic microvascular changes without CT evidence of large acute infarct. No intracranial hemorrhage. Atrophy. Vascular calcifications with ectasia most notable involving the left internal carotid artery supraclinoid segment 2. ASPECTS is 10 These results were called by telephone at the time of interpretation on 03/14/2016 at 11:01 am to Dr. Leonel Ramsay, who verbally acknowledged these results. Electronically Signed   By: Genia Del M.D.   On: 03/14/2016 11:05     CBC  Recent Labs Lab 03/14/16 1042 03/14/16 1047  WBC 8.2  --   HGB 14.9 15.3  HCT 44.2 45.0  PLT 183  --   MCV 95.1  --   MCH 32.0  --   MCHC 33.7  --   RDW 14.3  --   LYMPHSABS 1.8  --   MONOABS 0.8  --   EOSABS 0.3  --   BASOSABS 0.0  --     Chemistries   Recent Labs Lab 03/14/16 1042 03/14/16 1047  NA 139 139  K 4.0 3.9  CL 105 101  CO2 26  --   GLUCOSE 125* 124*  BUN 16 22*  CREATININE 1.28* 1.30*  CALCIUM 9.6  --   AST 29  --   ALT 13*  --   ALKPHOS 53  --   BILITOT 1.2  --    ------------------------------------------------------------------------------------------------------------------ estimated creatinine clearance is 33.3 mL/min (by C-G formula based on SCr of 1.3 mg/dL (H)). ------------------------------------------------------------------------------------------------------------------ No results for input(s): HGBA1C in the last 72 hours. ------------------------------------------------------------------------------------------------------------------  Recent Labs  03/15/16 0324  CHOL 192  HDL 82  LDLCALC 96  TRIG 71  CHOLHDL 2.3   ------------------------------------------------------------------------------------------------------------------ No results for input(s): TSH, T4TOTAL, T3FREE, THYROIDAB in the last 72 hours.  Invalid input(s):  FREET3 ------------------------------------------------------------------------------------------------------------------ No results for input(s): VITAMINB12, FOLATE, FERRITIN, TIBC, IRON, RETICCTPCT in the last 72 hours.  Coagulation profile  Recent Labs Lab 03/14/16 1042 03/14/16 1450  INR 0.95 0.99    No results for input(s): DDIMER in the last 72 hours.  Cardiac Enzymes  Recent Labs Lab 03/14/16 1450  TROPONINI <0.03   ------------------------------------------------------------------------------------------------------------------ Invalid input(s): POCBNP   CBG:  Recent Labs Lab 03/14/16 1101  GLUCAP 115*       Studies: Dg Chest 2 View  Result Date: 03/14/2016 CLINICAL DATA:  Right facial weakness and slurred speech. History of hypertension. EXAM: CHEST  2 VIEW COMPARISON:  11/15/2014 FINDINGS: Cardiac silhouette is borderline enlarged. No mediastinal or hilar masses. No evidence adenopathy. There are surgical vascular clips along the left hilum, stable. Mild stable linear scarring is noted in the lung bases. Lungs are otherwise clear. No pleural effusion or pneumothorax. Skeletal structures are diffusely demineralized but grossly intact.  IMPRESSION: No acute cardiopulmonary disease. Electronically Signed   By: Lajean Manes M.D.   On: 03/14/2016 14:51   Mr Angiogram Neck W Contrast  Result Date: 03/14/2016 CLINICAL DATA:  RIGHT facial weakness and slurred speech beginning at breakfast. History of colon cancer, hypertension and chronic kidney disease. EXAM: MRI HEAD WITH AND WITHOUT CONTRAST MRA HEAD WITHOUT CONTRAST MRA NECK WITHOUT AND WITH CONTRAST TECHNIQUE: Multiplanar, multiecho pulse sequences of the brain and surrounding structures were obtained with and without intravenous contrast. Angiographic images of the Circle of Willis were obtained using MRA technique without intravenous contrast. Angiographic images of the neck were obtained using MRA technique  without and with intravenous contrast. Carotid stenosis measurements (when applicable) are obtained utilizing NASCET criteria, using the distal internal carotid diameter as the denominator. CONTRAST:  6mL MULTIHANCE GADOBENATE DIMEGLUMINE 529 MG/ML IV SOLN COMPARISON:  CT HEAD March 14, 2016 at 1044 hours FINDINGS: MRI HEAD FINDINGS INTRACRANIAL CONTENTS: 19 x 6 mm area of reduced diffusion LEFT posterior frontal lobe/ precentral cortex to subcortical white matter with low ADC values and bright FLAIR T2 hyperintense signal. No susceptibility artifact to suggest hemorrhage. The ventricles and sulci are normal for patient's age. No suspicious parenchymal signal, masses, mass effect. Confluent supratentorial white matter FLAIR T2 hyperintensities. 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 No abnormal intraparenchymal or extra-axial enhancement. No abnormal extra-axial fluid collections. No extra-axial masses. VASCULAR: Normal major intracranial vascular flow voids present at skull base. SKULL AND UPPER CERVICAL SPINE: No abnormal sellar expansion. No suspicious calvarial bone marrow signal. Craniocervical junction maintained. Pannus posterior to the odontoid process partially effaces the ventral cerebral spinal fluid spaces, compatible with CPPD. Acute probable discogenic endplate changes 075-GRM, partially imaged. SINUSES/ORBITS: The mastoid air-cells and included paranasal sinuses are well-aerated. Status post bilateral ocular lens implants. The included ocular globes and orbital contents are non-suspicious. OTHER: None. MRA HEAD FINDINGS ANTERIOR CIRCULATION: Arch vessel origins not included in field of view. Normal flow related enhancement of the included cervical, petrous, cavernous and supraclinoid internal carotid arteries. Patent anterior communicating artery. Normal flow related enhancement of the anterior and middle cerebral arteries, including distal segments. Mild dolichoectasia. No large vessel occlusion,  high-grade stenosis, abnormal luminal irregularity, aneurysm. POSTERIOR CIRCULATION: Codominant vertebral artery's. Basilar artery is patent, with normal flow related enhancement of the main branch vessels. Normal flow related enhancement of the posterior cerebral arteries. Small dolichoectasia. Mild spurious stenosis bilateral P2 segments not present on source images compatible with tortuosity. No large vessel occlusion, high-grade stenosis, abnormal luminal irregularity, aneurysm. MRA NECK FINDINGS ANTERIOR CIRCULATION: The common carotid arteries are widely patent bilaterally. The carotid bifurcation is patent bilaterally and there is no hemodynamically significant carotid stenosis by NASCET criteria. Luminal irregularity LEFT lateral mid cervical internal carotid artery with patulous vessel distally. No discrete dissection flap. No evidence for atherosclerosis or flow limiting stenosis of the cervical internal carotid arteries. POSTERIOR CIRCULATION: Bilateral vertebral arteries are patent to the vertebrobasilar junction. No evidence for atherosclerosis or flow limiting stenosis. Source images and MIP image were reviewed. 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 Moderate chronic small vessel ischemic disease. MRA HEAD: No emergent large vessel occlusion or severe stenosis. Dolichoectasia compatible chronic hypertension. 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 Distal LEFT cervical internal carotid artery dolichoectasia without focal aneurysm. No hemodynamically significant stenosis. Electronically Signed   By: Elon Alas M.D.   On: 03/14/2016 23:42  Mr Jeri Cos Wo Contrast  Result Date: 03/14/2016 CLINICAL DATA:  RIGHT facial weakness and slurred speech beginning at breakfast. History of colon cancer, hypertension and chronic kidney  disease. EXAM: MRI HEAD WITH AND WITHOUT CONTRAST MRA HEAD WITHOUT CONTRAST MRA NECK WITHOUT AND WITH CONTRAST TECHNIQUE: Multiplanar, multiecho pulse sequences of the brain and surrounding structures were obtained with and without intravenous contrast. Angiographic images of the Circle of Willis were obtained using MRA technique without intravenous contrast. Angiographic images of the neck were obtained using MRA technique without and with intravenous contrast. Carotid stenosis measurements (when applicable) are obtained utilizing NASCET criteria, using the distal internal carotid diameter as the denominator. CONTRAST:  40mL MULTIHANCE GADOBENATE DIMEGLUMINE 529 MG/ML IV SOLN COMPARISON:  CT HEAD March 14, 2016 at 1044 hours FINDINGS: MRI HEAD FINDINGS INTRACRANIAL CONTENTS: 19 x 6 mm area of reduced diffusion LEFT posterior frontal lobe/ precentral cortex to subcortical white matter with low ADC values and bright FLAIR T2 hyperintense signal. No susceptibility artifact to suggest hemorrhage. The ventricles and sulci are normal for patient's age. No suspicious parenchymal signal, masses, mass effect. Confluent supratentorial white matter FLAIR T2 hyperintensities. 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 No abnormal intraparenchymal or extra-axial enhancement. No abnormal extra-axial fluid collections. No extra-axial masses. VASCULAR: Normal major intracranial vascular flow voids present at skull base. SKULL AND UPPER CERVICAL SPINE: No abnormal sellar expansion. No suspicious calvarial bone marrow signal. Craniocervical junction maintained. Pannus posterior to the odontoid process partially effaces the ventral cerebral spinal fluid spaces, compatible with CPPD. Acute probable discogenic endplate changes 075-GRM, partially imaged. SINUSES/ORBITS: The mastoid air-cells and included paranasal sinuses are well-aerated. Status post bilateral ocular lens implants. The included ocular globes and orbital contents are  non-suspicious. OTHER: None. MRA HEAD FINDINGS ANTERIOR CIRCULATION: Arch vessel origins not included in field of view. Normal flow related enhancement of the included cervical, petrous, cavernous and supraclinoid internal carotid arteries. Patent anterior communicating artery. Normal flow related enhancement of the anterior and middle cerebral arteries, including distal segments. Mild dolichoectasia. No large vessel occlusion, high-grade stenosis, abnormal luminal irregularity, aneurysm. POSTERIOR CIRCULATION: Codominant vertebral artery's. Basilar artery is patent, with normal flow related enhancement of the main branch vessels. Normal flow related enhancement of the posterior cerebral arteries. Small dolichoectasia. Mild spurious stenosis bilateral P2 segments not present on source images compatible with tortuosity. No large vessel occlusion, high-grade stenosis, abnormal luminal irregularity, aneurysm. MRA NECK FINDINGS ANTERIOR CIRCULATION: The common carotid arteries are widely patent bilaterally. The carotid bifurcation is patent bilaterally and there is no hemodynamically significant carotid stenosis by NASCET criteria. Luminal irregularity LEFT lateral mid cervical internal carotid artery with patulous vessel distally. No discrete dissection flap. No evidence for atherosclerosis or flow limiting stenosis of the cervical internal carotid arteries. POSTERIOR CIRCULATION: Bilateral vertebral arteries are patent to the vertebrobasilar junction. No evidence for atherosclerosis or flow limiting stenosis. Source images and MIP image were reviewed. 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 Moderate chronic small vessel ischemic disease. MRA HEAD: No emergent large vessel occlusion or severe stenosis. Dolichoectasia compatible chronic hypertension. 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 Distal LEFT cervical internal carotid artery dolichoectasia without focal aneurysm. No hemodynamically significant stenosis. Electronically Signed   By: Elon Alas M.D.   On: 03/14/2016 23:42   Mr Jodene Nam Head/brain [id number] Cm  Result Date: 03/14/2016 CLINICAL DATA:  RIGHT facial weakness and slurred speech  beginning at breakfast. History of colon cancer, hypertension and chronic kidney disease. EXAM: MRI HEAD WITH AND WITHOUT CONTRAST MRA HEAD WITHOUT CONTRAST MRA NECK WITHOUT AND WITH CONTRAST TECHNIQUE: Multiplanar, multiecho pulse sequences of the brain and surrounding structures were obtained with and without intravenous contrast. Angiographic images of the Circle of Willis were obtained using MRA technique without intravenous contrast. Angiographic images of the neck were obtained using MRA technique without and with intravenous contrast. Carotid stenosis measurements (when applicable) are obtained utilizing NASCET criteria, using the distal internal carotid diameter as the denominator. CONTRAST:  73mL MULTIHANCE GADOBENATE DIMEGLUMINE 529 MG/ML IV SOLN COMPARISON:  CT HEAD March 14, 2016 at 1044 hours FINDINGS: MRI HEAD FINDINGS INTRACRANIAL CONTENTS: 19 x 6 mm area of reduced diffusion LEFT posterior frontal lobe/ precentral cortex to subcortical white matter with low ADC values and bright FLAIR T2 hyperintense signal. No susceptibility artifact to suggest hemorrhage. The ventricles and sulci are normal for patient's age. No suspicious parenchymal signal, masses, mass effect. Confluent supratentorial white matter FLAIR T2 hyperintensities. 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 No abnormal intraparenchymal or extra-axial enhancement. No abnormal extra-axial fluid collections. No extra-axial masses. VASCULAR: Normal major intracranial vascular flow voids present at skull base. SKULL AND UPPER CERVICAL SPINE: No abnormal sellar expansion. No suspicious calvarial  bone marrow signal. Craniocervical junction maintained. Pannus posterior to the odontoid process partially effaces the ventral cerebral spinal fluid spaces, compatible with CPPD. Acute probable discogenic endplate changes 075-GRM, partially imaged. SINUSES/ORBITS: The mastoid air-cells and included paranasal sinuses are well-aerated. Status post bilateral ocular lens implants. The included ocular globes and orbital contents are non-suspicious. OTHER: None. MRA HEAD FINDINGS ANTERIOR CIRCULATION: Arch vessel origins not included in field of view. Normal flow related enhancement of the included cervical, petrous, cavernous and supraclinoid internal carotid arteries. Patent anterior communicating artery. Normal flow related enhancement of the anterior and middle cerebral arteries, including distal segments. Mild dolichoectasia. No large vessel occlusion, high-grade stenosis, abnormal luminal irregularity, aneurysm. POSTERIOR CIRCULATION: Codominant vertebral artery's. Basilar artery is patent, with normal flow related enhancement of the main branch vessels. Normal flow related enhancement of the posterior cerebral arteries. Small dolichoectasia. Mild spurious stenosis bilateral P2 segments not present on source images compatible with tortuosity. No large vessel occlusion, high-grade stenosis, abnormal luminal irregularity, aneurysm. MRA NECK FINDINGS ANTERIOR CIRCULATION: The common carotid arteries are widely patent bilaterally. The carotid bifurcation is patent bilaterally and there is no hemodynamically significant carotid stenosis by NASCET criteria. Luminal irregularity LEFT lateral mid cervical internal carotid artery with patulous vessel distally. No discrete dissection flap. No evidence for atherosclerosis or flow limiting stenosis of the cervical internal carotid arteries. POSTERIOR CIRCULATION: Bilateral vertebral arteries are patent to the vertebrobasilar junction. No evidence for atherosclerosis or flow  limiting stenosis. Source images and MIP image were reviewed. 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 Moderate chronic small vessel ischemic disease. MRA HEAD: No emergent large vessel occlusion or severe stenosis. Dolichoectasia compatible chronic hypertension. 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 Distal LEFT cervical internal carotid artery dolichoectasia without focal aneurysm. No hemodynamically significant stenosis. Electronically Signed   By: Elon Alas M.D.   On: 03/14/2016 23:42   Ct Head Code Stroke W/o Cm  Result Date: 03/14/2016 CLINICAL DATA:  Code stroke. 82 year old hypertensive male with slurred speech and right facial droop. Symptoms are better. Initial encounter. EXAM: CT HEAD WITHOUT CONTRAST TECHNIQUE: Contiguous  axial images were obtained from the base of the skull through the vertex without intravenous contrast. COMPARISON:  None. FINDINGS: Brain: No intracranial hemorrhage. Chronic microvascular changes without CT evidence of large acute infarct. Global atrophy without hydrocephalus. No intracranial mass lesion noted on this unenhanced exam. Vascular: Vascular calcifications with ectasia most notable involving the left internal carotid artery supraclinoid segment Skull: No acute abnormality. Sinuses/Orbits: No acute orbital abnormality post lens replacement Partial opacification anterior left ethmoid sinus air cell. Other: Degenerative changes temporal mandibular joint. ASPECTS [facility] Stroke Program Early CT Score) - Ganglionic level infarction (caudate, lentiform nuclei, internal capsule, insula, M1-M3 cortex): 7 - Supraganglionic infarction (M4-M6 cortex): 3 Total score (0-10 with 10 being normal): 10 IMPRESSION: 1. Chronic microvascular changes without CT evidence of large acute infarct. No intracranial  hemorrhage. Atrophy. Vascular calcifications with ectasia most notable involving the left internal carotid artery supraclinoid segment 2. ASPECTS is 10 These results were called by telephone at the time of interpretation on 03/14/2016 at 11:01 am to Dr. Leonel Ramsay, who verbally acknowledged these results. Electronically Signed   By: Genia Del M.D.   On: 03/14/2016 11:05      No results found for: HGBA1C Lab Results  Component Value Date   LDLCALC 96 03/15/2016   CREATININE 1.30 (H) 03/14/2016       Scheduled Meds: .  stroke: mapping our early stages of recovery book   Does not apply Once  . artificial tears   Both Eyes Daily  . atenolol  100 mg Oral Daily  . heparin  5,000 Units Subcutaneous Q8H  . triamterene-hydrochlorothiazide  1 tablet Oral Daily   Continuous Infusions: . sodium chloride 75 mL/hr at 03/14/16 1853     LOS: 0 days    Time spent: >30 MINS    [facility]  [facility] Pager [phone]. If 7PM-7AM, please contact night-coverage at [email or web address], password [facility] 03/15/2016, 8:35 AM  LOS: 0 days

## 2016-03-15 NOTE — Care Management Note (Signed)
Case Management Note  Patient Details  Name: Brendan Gonzalez MRN: [id number] Date of Birth: 03-31-1920  Subjective/Objective:    Pt admitted with CVA. He is from [place].                 Action/Plan: No f/u per OT. Awaiting PT recommendations. CM following for d/c needs.   Expected Discharge Date:                  Expected Discharge Plan:  Assisted Living / Rest Home  In-House Referral:     Discharge planning Services     Post Acute Care Choice:    Choice offered to:     DME Arranged:    DME Agency:     HH Arranged:    HH Agency:     Status of Service:  In process, will continue to follow  If discussed at Long Length of Stay Meetings, dates discussed:    Additional Comments:  Pollie Friar, RN 03/15/2016, 2:34 PM

## 2016-03-15 NOTE — Evaluation (Signed)
Clinical/Bedside Swallow Evaluation Patient Details  Name: Brendan Gonzalez MRN: [id number] Date of Birth: 08-May-1919  Today's Date: 03/15/2016 Time: SLP Start Time (ACUTE ONLY): 0735 SLP Stop Time (ACUTE ONLY): 0815 SLP Time Calculation (min) (ACUTE ONLY): 40 min  Past Medical History:  Past Medical History:  Diagnosis Date  . Chronic renal insufficiency, stage III (moderate)    CrCl 40s  . Colon cancer ([place]) 1993  . Hypertension   . Lumbar spinal stenosis    Surgery 2012 helped but in 2015 pain has returned  . Osteoarthritis    Past Surgical History:  Past Surgical History:  Procedure Laterality Date  . CHOLECYSTECTOMY  1984  . INGUINAL HERNIA REPAIR  1974   Bilat  . LUMBAR SPINE SURGERY  2012   for spinal stenosis (Dr. Jonelle Sports in [place])  . LUNG LOBECTOMY  1993   LLL per Brendan Gonzalez--worry of possible cancer but turned out to be benign.  [name] PARTIAL COLECTOMY  1993   for colon cancer   HPI:  Brendan Gonzalez adm to [facility] with dysarthria and right facial weakness.   Brendan Gonzalez found to have small left frontal nonhemmorhagic CVA, MCA CVA per MRI 03/14/2016.  Brendan Gonzalez resides with his wife and reports they manage home duties together.  Swallow eval ordered as Brendan Gonzalez failed RNSSS due to h/o dysphagia.  PMH + for smoking from age [age], s/p LLL lobectomy for growth that was benign, osteoarthritis.  Brendan Gonzalez reports his problems swallowing has resolved and he is not an a PPI.     Assessment / Plan / Recommendation Clinical Impression  Brendan Gonzalez presents with mild oral deficits due to facial nerve deficit - impacting facial motor function.  Decreased facial symmetry and anterior labial spillage of liquids on right noted.  Brendan Gonzalez reports sensation of face to be equal bilaterally- however advised to assure adequate oral clearance.  Large sequential boluses of thin resulted in postswallow cough, concerning for aspiration.  Small single boluses tolerated well.  Given Brendan Gonzalez has h/o dysphagia and smoking, advised him to use caution with eating/drinking  using teach back.  SLP to follow up x1 re: swallowing to assure tolerance.  Will follow up next date for SLE as Brendan Gonzalez has a headache today.   RN informed of headache after BSE.       Aspiration Risk  Mild aspiration risk    Diet Recommendation Regular;Thin liquid   Liquid Administration via: Cup;Straw Medication Administration: Whole meds with liquid Supervision: Patient able to self feed Compensations: Slow rate;Small sips/bites;Lingual sweep for clearance of pocketing;Monitor for anterior loss    Other  Recommendations Oral Care Recommendations: Oral care BID   Follow up Recommendations        Frequency and Duration min 1 x/week  1 week       Prognosis Prognosis for Safe Diet Advancement: Good      Swallow Study   General Date of Onset: 03/15/16 HPI: Brendan Gonzalez adm to [facility] with dysarthria and right facial weakness.   Brendan Gonzalez found to have small left frontal nonhemmorhagic CVA, MCA CVA per MRI 03/14/2016.  Brendan Gonzalez resides with his wife and reports they manage home duties together.  Swallow eval ordered as Brendan Gonzalez failed RNSSS due to h/o dysphagia.  PMH + for smoking from age [age], s/p LLL lobectomy for growth that was benign, osteoarthritis.  Brendan Gonzalez reports his problems swallowing has resolved and he is not an a PPI.   Type of Study: Bedside Swallow Evaluation Previous Swallow Assessment: esophagram 2013, small hiatal hernia  and faint reflux, tertiary contractions Diet Prior to this Study: NPO Temperature Spikes Noted: No Respiratory Status: Room air History of Recent Intubation: No Behavior/Cognition: Alert;Cooperative;Other (Comment) (? flat affect) Oral Cavity Assessment: Within Functional Limits Oral Care Completed by SLP: No Oral Cavity - Dentition: Adequate natural dentition Vision: Functional for self-feeding Self-Feeding Abilities: Able to feed self Patient Positioning: Upright in bed Baseline Vocal Quality: Low vocal intensity Volitional Cough: Strong Volitional Swallow: Able to elicit     Oral/Motor/Sensory Function Overall Oral Motor/Sensory Function: Mild impairment Facial ROM: Reduced right Facial Symmetry: Abnormal symmetry right Facial Strength: Reduced right Facial Sensation: Other (Comment) (Brendan Gonzalez reports sensation to be the same) Lingual ROM: Within Functional Limits Lingual Symmetry: Within Functional Limits Lingual Strength: Reduced Velum: Within Functional Limits   Ice Chips Ice chips: Not tested   Thin Liquid Thin Liquid: Impaired Presentation: Cup;Self Fed;Straw Oral Phase Functional Implications: Right anterior spillage Pharyngeal  Phase Impairments: Cough - Delayed Other Comments: delayed cough after Brendan Gonzalez consumed 5 ounces of water sequentially, no s/s of airway compromise with small single boluses    Nectar Thick Nectar Thick Liquid: Not tested   Honey Thick Honey Thick Liquid: Not tested   Puree Puree: Within functional limits Presentation: Self Fed;Spoon   Solid   GO   Solid: Impaired Presentation: Self Fed Oral Phase Impairments: Impaired mastication Oral Phase Functional Implications: Prolonged oral transit    Functional Assessment Tool Used: clinical judgement Functional Limitations: Swallowing Swallow Current Status [id number]): At least 20 percent but less than 40 percent impaired, limited or restricted Swallow Goal Status [phone]): At least 1 percent but less than 20 percent impaired, limited or restricted   Luanna Salk, [facility] SLP [phone]

## 2016-03-15 NOTE — Progress Notes (Signed)
Echocardiogram 2D Echocardiogram has been performed.  Aggie Cosier 03/15/2016, 11:34 AM

## 2016-03-15 NOTE — Consult Note (Addendum)
Neurology Consultation Reason for Consult: Right facial weakness Referring Physician: Laneta Simmers, D  CC: Right facial weakness  History is obtained from: Patient  HPI: Brendan Gonzalez is a 82 y.o. male who was in his normal state of health this morning when he developed right facial weakness and numbness. He noticed this when he was discussing with his wife noticed that he had slurred speech. He had spoken to her room 7 AM and had been normal at that time.   LKW: 7 AM tpa given?: no, unclear time of onset    ROS: A 14 point ROS was performed and is negative except as noted in the HPI.  Past Medical History:  Diagnosis Date  . Chronic renal insufficiency, stage III (moderate)    CrCl 40s  . Colon cancer ([place]) 1993  . Hypertension   . Lumbar spinal stenosis    Surgery 2012 helped but in 2015 pain has returned  . Osteoarthritis      Family History  Problem Relation Age of Onset  . Parkinson's disease Mother   . Heart attack Mother   . Cancer Brother   . Cancer Daughter      Social History:  reports that he has quit smoking. He has never used smokeless tobacco. He reports that he drinks about 0.6 - 1.2 oz of alcohol per week . He reports that he does not use drugs.   Exam: Current vital signs: BP (!) 168/93 (BP Location: Right Arm)   Pulse 89   Temp 98.8 F (37.1 C) (Oral)   Resp 16   Wt 75.3 kg (166 lb 0.1 oz)   SpO2 97%   BMI 27.20 kg/m  Vital signs in last 24 hours: Temp:  [98 F (36.7 C)-98.8 F (37.1 C)] 98.8 F (37.1 C) (12/04 2000) Pulse Rate:  [84-105] 89 (12/05 0600) Resp:  [12-20] 16 (12/05 0600) BP: (168-224)/(93-137) 168/93 (12/05 0600) SpO2:  [96 %-100 %] 97 % (12/05 0000) Weight:  [75.3 kg (166 lb 0.1 oz)] 75.3 kg (166 lb 0.1 oz) (12/04 1514)   Physical Exam  Constitutional: Appears well-developed and well-nourished.  Psych: Affect appropriate to situation Eyes: No scleral injection HENT: No OP obstrucion Head: Normocephalic.  Cardiovascular:  Normal rate and regular rhythm.  Respiratory: Effort normal and breath sounds normal to anterior ascultation GI: Soft.  No distension. There is no tenderness.  Skin: WDI  Neuro: Mental Status: Patient is awake, alert, oriented to person, place, month, year, and situation. Patient is able to give a clear and coherent history. No signs of aphasia or neglect Cranial Nerves: II: Visual Fields are full. Pupils are equal, round, and reactive to light.   III,IV, VI: EOMI without ptosis or diploplia.  V: Facial sensation with decreased sensation on the right face VII: Facial movement is notable for right facial droop VIII: hearing is intact to voice X: Uvula elevates symmetrically XI: Shoulder shrug is symmetric. XII: tongue is midline without atrophy or fasciculations.  Motor: Tone is normal. Bulk is normal. 5/5 strength was present in all four extremities.  Sensory: Sensation is symmetric to light touch and temperature in the arms and legs. Cerebellar: FNF and HKS are intact   I have reviewed labs in epic and the results pertinent to this consultation are: Brought on elevated creatinine  I have reviewed the images obtained: CT head-unremarkable  Impression: 82 year old male with acute onset dysarthria and right facial droop. He is outside the window for IV TPA and his symptoms are mild in  any case. I strongly suspect that he has had a small stroke and he will be admitted for stroke workup.  Recommendations: 1. HgbA1c, fasting lipid panel 2. MRI, MRA  of the brain without contrast 3. Frequent neuro checks 4. Echocardiogram 5. Carotid dopplers 6. Prophylactic therapy-Antiplatelet med: Aspirin - dose 325mg  PO or 300mg  PR 7. Risk factor modification 8. Telemetry monitoring 9. PT consult, OT consult, Speech consult 10. please page stroke NP  Or  PA  Or MD  M-F from 8am -4 pm starting 10/5 as this patient will be followed by the stroke team at this point.   You can look them up on  [email or web address]     Roland Rack, MD [facility] [phone]  If 7pm- 7am, please page neurology on call as listed in [place].

## 2016-03-15 NOTE — Progress Notes (Signed)
*  PRELIMINARY RESULTS* Vascular Ultrasound Carotid Duplex (Doppler) has been completed.   Findings suggest 1-39% internal carotid artery stenosis bilaterally. Vertebral arteries are patent with antegrade flow.  03/15/2016 12:05 PM Maudry Mayhew, BS, RVT, RDCS, RDMS

## 2016-03-15 NOTE — Evaluation (Signed)
Occupational Therapy Evaluation and Discharge Patient Details Name: Brendan Gonzalez MRN: [id number] DOB: 1919-08-05 Today's Date: 03/15/2016    History of Present Illness Pt admitted with facial drooping and slurred speech, dx with small L frontal hemorrhagic CVA. PMH: CKD, HTN.   Clinical Impression   Pt was independent and ambulated with a cane PTA. No cane available this visit, Gonzalez provided hand held assist and supervision for ambulation and standing ADL. No OT or equipment needs.    Follow Up Recommendations  No OT follow up    Equipment Recommendations  None recommended by OT    Recommendations for Other Services       Precautions / Restrictions        Mobility Bed Mobility Overal bed mobility: Modified Independent             General bed mobility comments: increased time, assist to manage quilt  Transfers Overall transfer level: Needs assistance   Transfers: Sit to/from Stand Sit to Stand: Supervision              Balance                                            ADL Overall ADL's : Needs assistance/impaired Eating/Feeding: Independent;Sitting   Grooming: Wash/dry hands;Wash/dry face;Brushing hair;Standing;Supervision/safety   Upper Body Bathing: Set up;Sitting   Lower Body Bathing: Supervison/ safety;Sit to/from stand   Upper Body Dressing : Set up;Sitting   Lower Body Dressing: Supervision/safety;Sit to/from stand   Toilet Transfer: Minimal assistance;Ambulation Toilet Transfer Details (indicate cue type and reason): one hand held [place] and Hygiene: Supervision/safety;Sit to/from stand       Functional mobility during ADLs: Minimal assistance (hand held in absence of cane)       Vision     Perception     Praxis      Pertinent Vitals/Pain Pain Assessment: 0-10 Pain Score: 4  Pain Location: head Pain Descriptors / Indicators: Aching Pain Intervention(s): Monitored during  session;Patient requesting pain meds-RN notified;RN gave pain meds during session     Hand Dominance Right   Extremity/Trunk Assessment Upper Extremity Assessment Upper Extremity Assessment: Overall WFL for tasks assessed   Lower Extremity Assessment Lower Extremity Assessment: Defer to PT evaluation       Communication Communication Communication: HOH   Cognition Arousal/Alertness: Awake/alert Behavior During Therapy: WFL for tasks assessed/performed Overall Cognitive Status: Within Functional Limits for tasks assessed                     General Comments       Exercises       Shoulder Instructions      Home Living Family/patient expects to be discharged to:: Private residence Living Arrangements: Spouse/significant other Available Help at Discharge: Family;Available 24 hours/day Type of Home: House Home Access: Level entry     Home Layout: One level     Bathroom Shower/Tub: Occupational psychologist: Handicapped height     Home Equipment: Cane - single point;Hand held shower head;Grab bars - tub/shower;Grab bars - toilet;Tub bench;Walker - 2 wheels          Prior Functioning/Environment Level of Independence: Independent with assistive device(s)        Comments: walked with cane        OT Problem List: Impaired balance (sitting and/or standing)  OT Treatment/Interventions:      OT Goals(Current goals can be found in the care plan section) Acute Rehab OT Goals Patient Stated Goal: to return home  OT Frequency:     Barriers to D/C:            Co-evaluation              End of Session Equipment Utilized During Treatment: Gait belt Nurse Communication: Patient requests pain meds  Activity Tolerance: Patient tolerated treatment well Patient left: in chair;with call bell/phone within reach;with chair alarm set   Time: [phone] OT Time Calculation (min): 37 min Charges:  OT General Charges $OT Visit: 1  Procedure OT Evaluation $OT Eval Moderate Complexity: 1 Procedure OT Treatments $Self Care/Home Management : 8-22 mins G-Codes: OT G-codes **NOT FOR INPATIENT CLASS** Functional Assessment Tool Used: clinical judgement Functional Limitation: Self care Self Care Current Status [id number]): At least 1 percent but less than 20 percent impaired, limited or restricted Self Care Discharge Status [phone]): At least 1 percent but less than 20 percent impaired, limited or restricted  Brendan Gonzalez 03/15/2016, 9:33 AM [phone]

## 2016-03-15 NOTE — Evaluation (Signed)
Physical Therapy Evaluation Patient Details Name: LEMON VIEW MRN: [id number] DOB: 1919/10/13 Today's Date: 03/15/2016   History of Present Illness  Pt admitted with facial drooping and slurred speech, dx with small L frontal hemorrhagic CVA. PMH: CKD, HTN.  Clinical Impression  Pt admitted with above diagnosis. Pt currently with functional limitations due to the deficits listed below (see PT Problem List). At the time of PT eval pt was able to perform transfers and ambulation with gross supervision for safety and occasional min guard assist. Pt reports that although he is doing well, he does not feel quite at his baseline of function and was agreeable to HHPT to follow. Pt will benefit from skilled PT to increase their independence and safety with mobility to allow discharge to the venue listed below.       Follow Up Recommendations Home health PT;Supervision for mobility/OOB    Equipment Recommendations  None recommended by PT    Recommendations for Other Services       Precautions / Restrictions Precautions Precautions: Fall Restrictions Weight Bearing Restrictions: No      Mobility  Bed Mobility Overal bed mobility: Modified Independent             General bed mobility comments: increased time, assist to manage quilt  Transfers Overall transfer level: Needs assistance Equipment used: Straight cane Transfers: Sit to/from Stand Sit to Stand: Supervision         General transfer comment: No assist required.  Ambulation/Gait Ambulation/Gait assistance: Min guard Ambulation Distance (Feet): 175 Feet Assistive device: Straight cane Gait Pattern/deviations: Step-through pattern;Decreased stride length Gait velocity: Decreased Gait velocity interpretation: Below normal speed for age/gender General Gait Details: Slow but steady gait pattern. With cane, pt states he is able to ambulate like normal. Stood in hall and used cane to point different directions when  discussing direction of his room and no LOB occured.   Stairs            Wheelchair Mobility    Modified Rankin (Stroke Patients Only) Modified Rankin (Stroke Patients Only) Pre-Morbid Rankin Score: No significant disability Modified Rankin: Moderately severe disability     Balance Overall balance assessment: Needs assistance Sitting-balance support: Feet supported;No upper extremity supported Sitting balance-Leahy Scale: Fair     Standing balance support: No upper extremity supported;During functional activity Standing balance-Leahy Scale: Fair                               Pertinent Vitals/Pain Pain Assessment: No/denies pain    Home Living Family/patient expects to be discharged to:: Private residence Living Arrangements: Spouse/significant other Available Help at Discharge: Family;Available 24 hours/day Type of Home: House Home Access: Level entry     Home Layout: One level Home Equipment: Cane - single point;Hand held shower head;Grab bars - tub/shower;Grab bars - toilet;Tub bench;Walker - 2 wheels      Prior Function Level of Independence: Independent with assistive device(s)         Comments: walked with cane     Hand Dominance   Dominant Hand: Right    Extremity/Trunk Assessment   Upper Extremity Assessment: Defer to OT evaluation           Lower Extremity Assessment: Overall WFL for tasks assessed         Communication   Communication: HOH  Cognition Arousal/Alertness: Awake/alert Behavior During Therapy: WFL for tasks assessed/performed Overall Cognitive Status: Within Functional Limits for tasks assessed  General Comments      Exercises     Assessment/Plan    PT Assessment Patient needs continued PT services  PT Problem List Decreased strength;Decreased range of motion;Decreased activity tolerance;Decreased balance;Decreased mobility;Decreased safety awareness;Decreased knowledge  of use of DME;Decreased knowledge of precautions;Cardiopulmonary status limiting activity;Pain          PT Treatment Interventions DME instruction;Gait training;Stair training;Functional mobility training;Therapeutic activities;Therapeutic exercise;Neuromuscular re-education;Patient/family education    PT Goals (Current goals can be found in the Care Plan section)  Acute Rehab PT Goals Patient Stated Goal: to return home PT Goal Formulation: With patient Time For Goal Achievement: 03/22/16 Potential to Achieve Goals: Good    Frequency Min 3X/week   Barriers to discharge        Co-evaluation               End of Session Equipment Utilized During Treatment: Gait belt Activity Tolerance: Patient tolerated treatment well Patient left: in bed;with call bell/phone within reach Nurse Communication: Mobility status    Functional Assessment Tool Used: Clinical judgement Functional Limitation: Mobility: Walking and moving around Mobility: Walking and Moving Around Current Status [id number]): At least 1 percent but less than 20 percent impaired, limited or restricted Mobility: Walking and Moving Around Goal Status [phone]): At least 1 percent but less than 20 percent impaired, limited or restricted    Time: [id number] PT Time Calculation (min) (ACUTE ONLY): 15 min   Charges:   PT Evaluation $PT Eval Low Complexity: 1 Procedure     PT G Codes:   PT G-Codes **NOT FOR INPATIENT CLASS** Functional Assessment Tool Used: Clinical judgement Functional Limitation: Mobility: Walking and moving around Mobility: Walking and Moving Around Current Status [id number]): At least 1 percent but less than 20 percent impaired, limited or restricted Mobility: Walking and Moving Around Goal Status [phone]): At least 1 percent but less than 20 percent impaired, limited or restricted    Thelma Comp 03/15/2016, 3:12 PM  Rolinda Roan, PT, DPT Acute Rehabilitation Services Pager: [phone]

## 2016-03-16 ENCOUNTER — Encounter (HOSPITAL_COMMUNITY)
Admission: EM | Disposition: A | Discharge status: Home Health | Payer: Self-pay | Source: Home / Self Care | Attending: Internal Medicine

## 2016-03-16 DIAGNOSIS — I639 Cerebral infarction, unspecified: Secondary | ICD-10-CM

## 2016-03-16 LAB — HEMOGLOBIN A1C
HEMOGLOBIN A1C: 5.2 % (ref 4.8–5.6)
Mean Plasma Glucose: 103 mg/dL

## 2016-03-16 LAB — CBC
HCT: 39.7 % (ref 39.0–52.0)
Hemoglobin: 13.1 g/dL (ref 13.0–17.0)
MCH: 31.5 pg (ref 26.0–34.0)
MCHC: 33 g/dL (ref 30.0–36.0)
MCV: 95.4 fL (ref 78.0–100.0)
PLATELETS: 153 10*3/uL (ref 150–400)
RBC: 4.16 MIL/uL — AB (ref 4.22–5.81)
RDW: 14.3 % (ref 11.5–15.5)
WBC: 7.4 10*3/uL (ref 4.0–10.5)

## 2016-03-16 SURGERY — ECHOCARDIOGRAM, TRANSESOPHAGEAL
Anesthesia: Moderate Sedation

## 2016-03-16 SURGERY — LOOP RECORDER INSERTION
Anesthesia: LOCAL

## 2016-03-16 MED ORDER — SENNOSIDES-DOCUSATE SODIUM 8.6-50 MG PO TABS: 1.0000 | ORAL_TABLET | Freq: Every evening | ORAL | 1 refills | Status: DC | PRN

## 2016-03-16 MED ORDER — ATORVASTATIN CALCIUM 10 MG PO TABS: 10.0000 mg | ORAL_TABLET | Freq: Every day | ORAL | 2 refills | Status: DC

## 2016-03-16 MED ORDER — ENALAPRIL MALEATE 10 MG PO TABS: 10.0000 mg | ORAL_TABLET | Freq: Two times a day (BID) | ORAL | 1 refills | Status: DC

## 2016-03-16 MED ORDER — CLOPIDOGREL BISULFATE 75 MG PO TABS: 75.0000 mg | ORAL_TABLET | Freq: Every day | ORAL | 1 refills | Status: DC

## 2016-03-16 NOTE — Evaluation (Signed)
Speech Language Pathology Evaluation Patient Details Name: Brendan Gonzalez MRN: [id number] DOB: [birth year] Today's Date: 03/16/2016 Time: [id number] SLP Time Calculation (min) (ACUTE ONLY): 32 min  Problem List:  Patient Active Problem List   Diagnosis Date Noted  . Cerebral embolism with cerebral infarction 03/15/2016  . Acute CVA (cerebrovascular accident) ([place]) 03/15/2016  . Stroke-like symptoms 03/14/2016  . Slurred speech   . Essential hypertension   . Stage 3 chronic kidney disease   . Acute bronchitis 01/27/2014  . URI (upper respiratory infection) 01/27/2014   Past Medical History:  Past Medical History:  Diagnosis Date  . Chronic renal insufficiency, stage III (moderate)    CrCl 40s  . Colon cancer ([place]) 1993  . Hypertension   . Lumbar spinal stenosis    Surgery 2012 helped but in 2015 pain has returned  . Osteoarthritis    Past Surgical History:  Past Surgical History:  Procedure Laterality Date  . CHOLECYSTECTOMY  1984  . INGUINAL HERNIA REPAIR  1974   [place]  . LUMBAR SPINE SURGERY  2012   for spinal stenosis (Dr. Jonelle [place] in [place]  . LUNG LOBECTOMY  1993   LLL per pt--worry of possible cancer but turned out to be benign.  [name] PARTIAL COLECTOMY  1993   for colon cancer   HPI:  82 yo male adm to [facility] with dysarthria and right facial weakness.   Pt found to have small left frontal nonhemmorhagic CVA, MCA CVA per MRI 03/14/2016.  Pt resides with his wife and reports they manage home duties together.  Swallow eval ordered as pt failed RNSSS due to h/o dysphagia.  PMH + for smoking from age [age], s/p LLL lobectomy for growth that was benign, osteoarthritis.  Pt reports his problems swallowing has resolved and he is not an a PPI.     Assessment / Plan / Recommendation Clinical Impression  MOCA administered and pt scored 26/30 demonstrating within normal limits.   Areas of strengths include visuospatial/executive functioning and language.  Pt did demonstrate mild  difficulties with memory task, naming 3/5 words independently, category cue for 1 and did not identify 1/ 5 with mulitple choice - scoring 11/15 on MIS.  Pt remains mildly dysarthric but improves intelligiblity independently when cued to lack of understanding.  No SlP follow up re: dysarthria/cognitive skills.  Educated pt to findings/recommendations.     SLP Assessment  Patient does not need any further Speech Lanaguage Pathology Services    Follow Up Recommendations  None    Frequency and Duration   n/a        SLP Evaluation Cognition  Overall Cognitive Status: Within Functional Limits for tasks assessed Arousal/Alertness: Awake/alert Orientation Level: Oriented X4 Attention: Sustained;Selective Sustained Attention: Appears intact Selective Attention: Appears intact Memory: Impaired (recalled 3/5 words independently, 1/5 with category cue, did not identify 1/5,  MIS score 11/15) Memory Impairment: Retrieval deficit Awareness: Appears intact Problem Solving: Appears intact Safety/Judgment: Appears intact       Comprehension  Auditory Comprehension Overall Auditory Comprehension: Appears within functional limits for tasks assessed Yes/No Questions: Within Functional Limits Commands: Within Functional Limits Visual Recognition/Discrimination Discrimination: Within Function Limits Reading Comprehension Reading Status: Within funtional limits    Expression Expression Primary Mode of Expression: Verbal Verbal Expression Overall Verbal Expression: Appears within functional limits for tasks assessed Initiation: No impairment Repetition: No impairment Naming: No impairment Pragmatics: No impairment Written Expression Dominant Hand: Right   Oral / Motor  Oral Motor/Sensory Function Overall Oral Motor/Sensory Function: Mild  impairment Facial ROM: Reduced right Facial Symmetry: Abnormal symmetry right Facial Strength: Reduced right Facial Sensation: Other (Comment) (pt  reports sensation to be the same) Lingual ROM: Within Functional Limits Lingual Symmetry: Within Functional Limits Lingual Strength: Reduced Velum: Within Functional Limits Motor Speech Overall Motor Speech: Impaired Respiration: Within functional limits Phonation: Normal Articulation: Impaired Level of Impairment: Phrase Intelligibility: Intelligible Motor Planning: Witnin functional limits Motor Speech Errors: Not applicable Effective Techniques: Over-articulate   [place], [facility] SLP [phone]

## 2016-03-16 NOTE — Progress Notes (Signed)
Speech Language Pathology Treatment: Dysphagia  Patient Details Name: Brendan Gonzalez MRN: [id number] DOB: [birth year] Today's Date: 03/16/2016 Time: [id number] SLP Time Calculation (min) (ACUTE ONLY): 10 min  Assessment / Plan / Recommendation Clinical Impression  Pt reports poor appetite but denies dysphagia symptoms.  Observed pt consuming bagel, eggs, orange juice and coffee. No anterior labial spillage on right and no indication of airway compromise.  Reviewed aspiration precautions with pt and need to assure not pocketing.  Swallowing ability is likely at baseline.  NO SLP follow up indicated as pt mitigating dysphagia/dysarthria well.     HPI HPI: 82 yo male adm to [facility] with dysarthria and right facial weakness.   Pt found to have small left frontal nonhemmorhagic CVA, MCA CVA per MRI 03/14/2016.  Pt resides with his wife and reports they manage home duties together.  Swallow eval ordered as pt failed RNSSS due to h/o dysphagia.  PMH + for smoking from age [age], s/p LLL lobectomy for growth that was benign, osteoarthritis.  Pt reports his problems swallowing has resolved and he is not an a PPI.        SLP Plan  All goals met     Recommendations  Diet recommendations: Regular;Thin liquid Liquids provided via: Cup;Straw Medication Administration: Whole meds with liquid Supervision: Patient able to self feed Compensations: Minimize environmental distractions;Small sips/bites;Slow rate Postural Changes and/or Swallow Maneuvers: Seated upright 90 degrees;Upright 30-60 min after meal                Oral Care Recommendations: Oral care BID Follow up Recommendations: None Plan: All goals met       [place], [place], [place] [facility] SLP [phone]

## 2016-03-16 NOTE — Progress Notes (Signed)
Patient wanting to d/c to [facility] for rehab. The recommendations are for [facility]. CM spoke to the patient and his son about the recommendations and that patient walked 175 feet with therapy.  CSW has started authorization with [facility] Medicare to see if patient will get approval for rehab. CM informed the patient and his son that if they did not approve or we did not get authorization back this afternoon the patient is discharged.  Patients son more concerned about his father showing some depression and not wanting to take his medications. CM explained to the son that insurance is going to look at his activity level for the rehab stay. CM informed him that [facility] services were ordered for home and that a RN could be added to help encourage the patient with his medications at home.  The patients son continues to feel his father needs an increased level of care. CM encouraged him to talk with [facility] and see if they have an Assisted Living Bed that he my be able to d/c to. Awaiting son to call CM back.

## 2016-03-16 NOTE — Care Management Note (Signed)
Case Management Note  Patient Details  Name: DAK SZUMSKI MRN: [id number] Date of Birth: 24-Nov-1919  Subjective/Objective:                    Action/Plan: Pt unable to get into ALF at [facility]. Pts son decided to let him come home with [facility] services and assistance from [facility] with his medications. CM met with the patient and he asked to use the [facility] agency that [facility] [place] [facility] uses. CM called [facility] and they use [facility]. Santiago Glad with [facility] notified and accepted the referral. Pt son to transport him home tonight.   Expected Discharge Date:                  Expected Discharge Plan:  Assisted Living / Rest Home  In-House Referral:     Discharge planning Services  CM Consult  Post Acute Care Choice:  Home Health Choice offered to:  Patient, Adult Children  DME Arranged:    DME Agency:     HH Arranged:  RN, PT, OT, Nurse's Aide, Social Work [employer] Agency:  [place]  Status of Service:  Completed, signed off  If discussed at [employer], dates discussed:    Additional Comments:  Pollie Friar, RN 03/16/2016, 4:18 PM

## 2016-03-16 NOTE — Discharge Summary (Signed)
Physician Discharge Summary  Brendan Gonzalez MRN: [id number] DOB/AGE: 1919-05-13 82 y.o.  PCP: Tammi Sou, MD   Admit date: 03/14/2016 Discharge date: 03/16/2016  Discharge Diagnoses:    Active Problems:   Stroke-like symptoms   Slurred speech   Essential hypertension   Stage 3 chronic kidney disease   Cerebral embolism with cerebral infarction   Acute CVA (cerebrovascular accident) (Brendan Gonzalez)    Follow-up recommendations Follow-up with PCP in 3-5 days , including all  additional recommended appointments as below Follow-up CBC, CMP in 3-5 days       Current Discharge Medication List    START taking these medications   Details  atorvastatin (LIPITOR) 10 MG tablet Take 1 tablet (10 mg total) by mouth daily at 6 PM. Qty: 30 tablet, Refills: 2    clopidogrel (PLAVIX) 75 MG tablet Take 1 tablet (75 mg total) by mouth daily. Qty: 30 tablet, Refills: 1    senna-docusate (SENOKOT-S) 8.6-50 MG tablet Take 1 tablet by mouth at bedtime as needed for moderate constipation. Qty: 30 tablet, Refills: 1      CONTINUE these medications which have CHANGED   Details  enalapril (VASOTEC) 10 MG tablet Take 1 tablet (10 mg total) by mouth 2 (two) times daily. Qty: 60 tablet, Refills: 1      CONTINUE these medications which have NOT CHANGED   Details  atenolol (TENORMIN) 100 MG tablet Take 1/2 Tablets By Mouth Twice A Day Qty: 90 tablet, Refills: 3    cholecalciferol (VITAMIN D) 1000 UNITS tablet Take 1,000 Units by mouth daily.    HYDROcodone-acetaminophen (NORCO) 5-325 MG tablet Take 0.5 tablets by mouth every 6 (six) hours as needed for severe pain. Qty: 60 tablet, Refills: 0    Hypromellose (ARTIFICIAL TEARS OP) Place 1 drop into both eyes daily.    vitamin C (ASCORBIC ACID) 500 MG tablet Take 500 mg by mouth daily.    vitamin E 400 UNIT capsule Take 400 Units by mouth every other day.      STOP taking these medications     aspirin 325 MG tablet       triamterene-hydrochlorothiazide (MAXZIDE) 75-50 MG per tablet          Discharge Condition: *home health   Discharge Instructions Get Medicines reviewed and adjusted: Please take all your medications with you for your next visit with your Primary MD  Please request your Primary MD to go over all hospital tests and procedure/radiological results at the follow up, please ask your Primary MD to get all Hospital records sent to his/her office.  If you experience worsening of your admission symptoms, develop shortness of breath, life threatening emergency, suicidal or homicidal thoughts you must seek medical attention immediately by calling 911 or calling your MD immediately if symptoms less severe.  You must read complete instructions/literature along with all the possible adverse reactions/side effects for all the Medicines you take and that have been prescribed to you. Take any new Medicines after you have completely understood and accpet all the possible adverse reactions/side effects.   Do not drive when taking Pain medications.   Do not take more than prescribed Pain, Sleep and Anxiety Medications  Special Instructions: If you have smoked or chewed Tobacco in the last 2 yrs please stop smoking, stop any regular Alcohol and or any Recreational drug use.  Wear Seat belts while driving.  Please note  You were cared for by a hospitalist during your hospital stay. Once you are discharged, your primary  care physician will handle any further medical issues. Please note that NO REFILLS for any discharge medications will be authorized once you are discharged, as it is imperative that you return to your primary care physician (or establish a relationship with a primary care physician if you do not have one) for your aftercare needs so that they can reassess your need for medications and monitor your lab values.     No Known Allergies    Disposition: home health   Consults:  * Neurology     Significant Diagnostic Studies:  Dg Chest 2 View  Result Date: 03/14/2016 CLINICAL DATA:  Right facial weakness and slurred speech. History of hypertension. EXAM: CHEST  2 VIEW COMPARISON:  11/15/2014 FINDINGS: Cardiac silhouette is borderline enlarged. No mediastinal or hilar masses. No evidence adenopathy. There are surgical vascular clips along the left hilum, stable. Mild stable linear scarring is noted in the lung bases. Lungs are otherwise clear. No pleural effusion or pneumothorax. Skeletal structures are diffusely demineralized but grossly intact. IMPRESSION: No acute cardiopulmonary disease. Electronically Signed   By: Lajean Manes M.D.   On: 03/14/2016 14:51   Mr Brendan Gonzalez Neck W Wo Contrast  Result Date: 03/14/2016 CLINICAL DATA:  RIGHT facial weakness and slurred speech beginning at breakfast. History of colon cancer, hypertension and chronic kidney disease. EXAM: MRI HEAD WITH AND WITHOUT CONTRAST MRA HEAD WITHOUT CONTRAST MRA NECK WITHOUT AND WITH CONTRAST TECHNIQUE: Multiplanar, multiecho pulse sequences of the brain and surrounding structures were obtained with and without intravenous contrast. Angiographic images of the Circle of Willis were obtained using MRA technique without intravenous contrast. Angiographic images of the neck were obtained using MRA technique without and with intravenous contrast. Carotid stenosis measurements (when applicable) are obtained utilizing NASCET criteria, using the distal internal carotid diameter as the denominator. CONTRAST:  64m MULTIHANCE GADOBENATE DIMEGLUMINE 529 MG/ML IV SOLN COMPARISON:  CT HEAD March 14, 2016 at 1044 hours FINDINGS: MRI HEAD FINDINGS INTRACRANIAL CONTENTS: 19 x 6 mm area of reduced diffusion LEFT posterior frontal lobe/ precentral cortex to subcortical white matter with low ADC values and bright FLAIR T2 hyperintense signal. No susceptibility artifact to suggest hemorrhage. The ventricles and sulci are normal for  patient's age. No suspicious parenchymal signal, masses, mass effect. Confluent supratentorial white matter FLAIR T2 hyperintensities. 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 No abnormal intraparenchymal or extra-axial enhancement. No abnormal extra-axial fluid collections. No extra-axial masses. VASCULAR: Normal major intracranial vascular flow voids present at skull base. SKULL AND UPPER CERVICAL SPINE: No abnormal sellar expansion. No suspicious calvarial bone marrow signal. Craniocervical junction maintained. Pannus posterior to the odontoid process partially effaces the ventral cerebral spinal fluid spaces, compatible with CPPD. Acute probable discogenic endplate changes CV5-6 partially imaged. SINUSES/ORBITS: The mastoid air-cells and included paranasal sinuses are well-aerated. Status post bilateral ocular lens implants. The included ocular globes and orbital contents are non-suspicious. OTHER: None. MRA HEAD FINDINGS ANTERIOR CIRCULATION: Arch vessel origins not included in field of view. Normal flow related enhancement of the included cervical, petrous, cavernous and supraclinoid internal carotid arteries. Patent anterior communicating artery. Normal flow related enhancement of the anterior and middle cerebral arteries, including distal segments. Mild dolichoectasia. No large vessel occlusion, high-grade stenosis, abnormal luminal irregularity, aneurysm. POSTERIOR CIRCULATION: Codominant vertebral artery's. Basilar artery is patent, with normal flow related enhancement of the main branch vessels. Normal flow related enhancement of the posterior cerebral arteries. Small dolichoectasia. Mild spurious stenosis bilateral P2 segments not present on source images compatible with tortuosity. No large  vessel occlusion, high-grade stenosis, abnormal luminal irregularity, aneurysm. MRA NECK FINDINGS ANTERIOR CIRCULATION: The common carotid arteries are widely patent bilaterally. The carotid bifurcation is patent  bilaterally and there is no hemodynamically significant carotid stenosis by NASCET criteria. Luminal irregularity LEFT lateral mid cervical internal carotid artery with patulous vessel distally. No discrete dissection flap. No evidence for atherosclerosis or flow limiting stenosis of the cervical internal carotid arteries. POSTERIOR CIRCULATION: Bilateral vertebral arteries are patent to the vertebrobasilar junction. No evidence for atherosclerosis or flow limiting stenosis. Source images and MIP image were reviewed. 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 Moderate chronic small vessel ischemic disease. MRA HEAD: No emergent large vessel occlusion or severe stenosis. Dolichoectasia compatible chronic hypertension. 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 Distal LEFT cervical internal carotid artery dolichoectasia without focal aneurysm. No hemodynamically significant stenosis. Electronically Signed   By: Elon Alas M.D.   On: 03/14/2016 23:42   Mr Jeri Cos PY Contrast  Result Date: 03/14/2016 CLINICAL DATA:  RIGHT facial weakness and slurred speech beginning at breakfast. History of colon cancer, hypertension and chronic kidney disease. EXAM: MRI HEAD WITH AND WITHOUT CONTRAST MRA HEAD WITHOUT CONTRAST MRA NECK WITHOUT AND WITH CONTRAST TECHNIQUE: Multiplanar, multiecho pulse sequences of the brain and surrounding structures were obtained with and without intravenous contrast. Angiographic images of the Circle of Willis were obtained using MRA technique without intravenous contrast. Angiographic images of the neck were obtained using MRA technique without and with intravenous contrast. Carotid stenosis measurements (when applicable) are obtained utilizing NASCET criteria, using the distal internal carotid diameter as the denominator. CONTRAST:   69m MULTIHANCE GADOBENATE DIMEGLUMINE 529 MG/ML IV SOLN COMPARISON:  CT HEAD March 14, 2016 at 1044 hours FINDINGS: MRI HEAD FINDINGS INTRACRANIAL CONTENTS: 19 x 6 mm area of reduced diffusion LEFT posterior frontal lobe/ precentral cortex to subcortical white matter with low ADC values and bright FLAIR T2 hyperintense signal. No susceptibility artifact to suggest hemorrhage. The ventricles and sulci are normal for patient's age. No suspicious parenchymal signal, masses, mass effect. Confluent supratentorial white matter FLAIR T2 hyperintensities. 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 No abnormal intraparenchymal or extra-axial enhancement. No abnormal extra-axial fluid collections. No extra-axial masses. VASCULAR: Normal major intracranial vascular flow voids present at skull base. SKULL AND UPPER CERVICAL SPINE: No abnormal sellar expansion. No suspicious calvarial bone marrow signal. Craniocervical junction maintained. Pannus posterior to the odontoid process partially effaces the ventral cerebral spinal fluid spaces, compatible with CPPD. Acute probable discogenic endplate changes CP9-5 partially imaged. SINUSES/ORBITS: The mastoid air-cells and included paranasal sinuses are well-aerated. Status post bilateral ocular lens implants. The included ocular globes and orbital contents are non-suspicious. OTHER: None. MRA HEAD FINDINGS ANTERIOR CIRCULATION: Arch vessel origins not included in field of view. Normal flow related enhancement of the included cervical, petrous, cavernous and supraclinoid internal carotid arteries. Patent anterior communicating artery. Normal flow related enhancement of the anterior and middle cerebral arteries, including distal segments. Mild dolichoectasia. No large vessel occlusion, high-grade stenosis, abnormal luminal irregularity, aneurysm. POSTERIOR CIRCULATION: Codominant vertebral artery's. Basilar artery is patent, with normal flow related enhancement of the main branch vessels.  Normal flow related enhancement of the posterior cerebral arteries. Small dolichoectasia. Mild spurious stenosis bilateral P2 segments not present on source images compatible with tortuosity. No large vessel occlusion, high-grade stenosis, abnormal luminal irregularity, aneurysm. MRA NECK FINDINGS ANTERIOR CIRCULATION: The common carotid arteries are  widely patent bilaterally. The carotid bifurcation is patent bilaterally and there is no hemodynamically significant carotid stenosis by NASCET criteria. Luminal irregularity LEFT lateral mid cervical internal carotid artery with patulous vessel distally. No discrete dissection flap. No evidence for atherosclerosis or flow limiting stenosis of the cervical internal carotid arteries. POSTERIOR CIRCULATION: Bilateral vertebral arteries are patent to the vertebrobasilar junction. No evidence for atherosclerosis or flow limiting stenosis. Source images and MIP image were reviewed. 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 Moderate chronic small vessel ischemic disease. MRA HEAD: No emergent large vessel occlusion or severe stenosis. Dolichoectasia compatible chronic hypertension. 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 Distal LEFT cervical internal carotid artery dolichoectasia without focal aneurysm. No hemodynamically significant stenosis. Electronically Signed   By: Elon Alas M.D.   On: 03/14/2016 23:42   Mr Brendan Gonzalez Head/brain LT Cm  Result Date: 03/14/2016 CLINICAL DATA:  RIGHT facial weakness and slurred speech beginning at breakfast. History of colon cancer, hypertension and chronic kidney disease. EXAM: MRI HEAD WITH AND WITHOUT CONTRAST MRA HEAD WITHOUT CONTRAST MRA NECK WITHOUT AND WITH CONTRAST TECHNIQUE: Multiplanar, multiecho pulse sequences of the brain and surrounding structures were  obtained with and without intravenous contrast. Angiographic images of the Circle of Willis were obtained using MRA technique without intravenous contrast. Angiographic images of the neck were obtained using MRA technique without and with intravenous contrast. Carotid stenosis measurements (when applicable) are obtained utilizing NASCET criteria, using the distal internal carotid diameter as the denominator. CONTRAST:  13m MULTIHANCE GADOBENATE DIMEGLUMINE 529 MG/ML IV SOLN COMPARISON:  CT HEAD March 14, 2016 at 1044 hours FINDINGS: MRI HEAD FINDINGS INTRACRANIAL CONTENTS: 19 x 6 mm area of reduced diffusion LEFT posterior frontal lobe/ precentral cortex to subcortical white matter with low ADC values and bright FLAIR T2 hyperintense signal. No susceptibility artifact to suggest hemorrhage. The ventricles and sulci are normal for patient's age. No suspicious parenchymal signal, masses, mass effect. Confluent supratentorial white matter FLAIR T2 hyperintensities. 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 No abnormal intraparenchymal or extra-axial enhancement. No abnormal extra-axial fluid collections. No extra-axial masses. VASCULAR: Normal major intracranial vascular flow voids present at skull base. SKULL AND UPPER CERVICAL SPINE: No abnormal sellar expansion. No suspicious calvarial bone marrow signal. Craniocervical junction maintained. Pannus posterior to the odontoid process partially effaces the ventral cerebral spinal fluid spaces, compatible with CPPD. Acute probable discogenic endplate changes CJ0-3 partially imaged. SINUSES/ORBITS: The mastoid air-cells and included paranasal sinuses are well-aerated. Status post bilateral ocular lens implants. The included ocular globes and orbital contents are non-suspicious. OTHER: None. MRA HEAD FINDINGS ANTERIOR CIRCULATION: Arch vessel origins not included in field of view. Normal flow related enhancement of the included cervical, petrous, cavernous and supraclinoid  internal carotid arteries. Patent anterior communicating artery. Normal flow related enhancement of the anterior and middle cerebral arteries, including distal segments. Mild dolichoectasia. No large vessel occlusion, high-grade stenosis, abnormal luminal irregularity, aneurysm. POSTERIOR CIRCULATION: Codominant vertebral artery's. Basilar artery is patent, with normal flow related enhancement of the main branch vessels. Normal flow related enhancement of the posterior cerebral arteries. Small dolichoectasia. Mild spurious stenosis bilateral P2 segments not present on source images compatible with tortuosity. No large vessel occlusion, high-grade stenosis, abnormal luminal irregularity, aneurysm. MRA NECK FINDINGS ANTERIOR CIRCULATION: The common carotid arteries are widely patent bilaterally. The carotid bifurcation is patent bilaterally and there is no hemodynamically significant carotid stenosis by NASCET  criteria. Luminal irregularity LEFT lateral mid cervical internal carotid artery with patulous vessel distally. No discrete dissection flap. No evidence for atherosclerosis or flow limiting stenosis of the cervical internal carotid arteries. POSTERIOR CIRCULATION: Bilateral vertebral arteries are patent to the vertebrobasilar junction. No evidence for atherosclerosis or flow limiting stenosis. Source images and MIP image were reviewed. 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 Moderate chronic small vessel ischemic disease. MRA HEAD: No emergent large vessel occlusion or severe stenosis. Dolichoectasia compatible chronic hypertension. 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 Distal LEFT cervical internal carotid artery dolichoectasia without focal aneurysm. No hemodynamically significant stenosis. Electronically Signed   By: Elon Alas M.D.    On: 03/14/2016 23:42   Ct Head Code Stroke W/o Cm  Result Date: 03/14/2016 CLINICAL DATA:  Code stroke. 82 year old hypertensive male with slurred speech and right facial droop. Symptoms are better. Initial encounter. EXAM: CT HEAD WITHOUT CONTRAST TECHNIQUE: Contiguous axial images were obtained from the base of the skull through the vertex without intravenous contrast. COMPARISON:  None. FINDINGS: Brain: No intracranial hemorrhage. Chronic microvascular changes without CT evidence of large acute infarct. Global atrophy without hydrocephalus. No intracranial mass lesion noted on this unenhanced exam. Vascular: Vascular calcifications with ectasia most notable involving the left internal carotid artery supraclinoid segment Skull: No acute abnormality. Sinuses/Orbits: No acute orbital abnormality post lens replacement Partial opacification anterior left ethmoid sinus air cell. Other: Degenerative changes temporal mandibular joint. ASPECTS [facility] Stroke Program Early CT Score) - Ganglionic level infarction (caudate, lentiform nuclei, internal capsule, insula, M1-M3 cortex): 7 - Supraganglionic infarction (M4-M6 cortex): 3 Total score (0-10 with 10 being normal): 10 IMPRESSION: 1. Chronic microvascular changes without CT evidence of large acute infarct. No intracranial hemorrhage. Atrophy. Vascular calcifications with ectasia most notable involving the left internal carotid artery supraclinoid segment 2. ASPECTS is 10 These results were called by telephone at the time of interpretation on 03/14/2016 at 11:01 am to Dr. Leonel Ramsay, who verbally acknowledged these results. Electronically Signed   By: Genia Del M.D.   On: 03/14/2016 11:05     ECHOCARDIOGRAM Left ventricle: The cavity size was normal. Systolic function was   normal. Wall motion was normal; there were no regional wall   motion abnormalities. There was an increased relative   contribution of atrial contraction to ventricular filling.    Doppler parameters are consistent with abnormal left ventricular   relaxation (grade 1 diastolic dysfunction). - Aortic valve: There was trivial regurgitation. - Aorta: Aortic root dimension: 45 mm (ED). Ascending aortic   diameter: 38 mm (S). - Aortic root: The aortic root was moderately dilated. - Ascending aorta: The ascending aorta was mildly dilated.     Filed Weights   03/14/16 1514  Weight: 75.3 kg (166 lb 0.1 oz)     Microbiology: No results found for this or any previous visit (from the past 240 hour(s)).     Blood Culture No results found for: SDES, [place], CULT, REPTSTATUS    Labs: Results for orders placed or performed during the hospital encounter of 03/14/16 (from the past 48 hour(s))  Ethanol     Status: None   Collection Time: 03/14/16 10:40 AM  Result Value Ref Range   Alcohol, Ethyl (B) <5 <5 mg/dL    Comment:        LOWEST DETECTABLE LIMIT FOR SERUM ALCOHOL IS 5 mg/dL FOR MEDICAL PURPOSES ONLY  Protime-INR     Status: None   Collection Time: 03/14/16 10:42 AM  Result Value Ref Range   Prothrombin Time 12.6 11.4 - 15.2 seconds   INR 0.95   APTT     Status: None   Collection Time: 03/14/16 10:42 AM  Result Value Ref Range   aPTT 25 24 - 36 seconds  CBC     Status: None   Collection Time: 03/14/16 10:42 AM  Result Value Ref Range   WBC 8.2 4.0 - 10.5 K/uL   RBC 4.65 4.22 - 5.81 MIL/uL   Hemoglobin 14.9 13.0 - 17.0 g/dL   HCT 44.2 39.0 - 52.0 %   MCV 95.1 78.0 - 100.0 fL   MCH 32.0 26.0 - 34.0 pg   MCHC 33.7 30.0 - 36.0 g/dL   RDW 14.3 11.5 - 15.5 %   Platelets 183 150 - 400 K/uL  Differential     Status: None   Collection Time: 03/14/16 10:42 AM  Result Value Ref Range   Neutrophils Relative % 65 %   Neutro Abs 5.4 1.7 - 7.7 K/uL   Lymphocytes Relative 21 %   Lymphs Abs 1.8 0.7 - 4.0 K/uL   Monocytes Relative 10 %   Monocytes Absolute 0.8 0.1 - 1.0 K/uL   Eosinophils Relative 3 %   Eosinophils Absolute 0.3 0.0 - 0.7 K/uL    Basophils Relative 1 %   Basophils Absolute 0.0 0.0 - 0.1 K/uL  Comprehensive metabolic panel     Status: Abnormal   Collection Time: 03/14/16 10:42 AM  Result Value Ref Range   Sodium 139 135 - 145 mmol/L   Potassium 4.0 3.5 - 5.1 mmol/L   Chloride 105 101 - 111 mmol/L   CO2 26 22 - 32 mmol/L   Glucose, Bld 125 (H) 65 - 99 mg/dL   BUN 16 6 - 20 mg/dL   Creatinine, Ser 1.28 (H) 0.61 - 1.24 mg/dL   Calcium 9.6 8.9 - 10.3 mg/dL   Total Protein 7.2 6.5 - 8.1 g/dL   Albumin 3.9 3.5 - 5.0 g/dL   AST 29 15 - 41 U/L   ALT 13 (L) 17 - 63 U/L   Alkaline Phosphatase 53 38 - 126 U/L   Total Bilirubin 1.2 0.3 - 1.2 mg/dL   GFR calc non Af Amer 46 (L) >60 mL/min   GFR calc Af Amer 53 (L) >60 mL/min    Comment: (NOTE) The eGFR has been calculated using the CKD EPI equation. This calculation has not been validated in all clinical situations. eGFR's persistently <60 mL/min signify possible Chronic Kidney Disease.    Anion gap 8 5 - 15  I-stat troponin, ED (not at [facility], [facility])     Status: None   Collection Time: 03/14/16 10:45 AM  Result Value Ref Range   Troponin i, poc 0.01 0.00 - 0.08 ng/mL   Comment 3            Comment: Due to the release kinetics of cTnI, a negative result within the first hours of the onset of symptoms does not rule out myocardial infarction with certainty. If myocardial infarction is still suspected, repeat the test at appropriate intervals.   I-Stat Chem 8, ED  (not at [facility], [facility])     Status: Abnormal   Collection Time: 03/14/16 10:47 AM  Result Value Ref Range   Sodium 139 135 - 145 mmol/L   Potassium 3.9 3.5 - 5.1 mmol/L   Chloride 101 101 - 111 mmol/L  BUN 22 (H) 6 - 20 mg/dL   Creatinine, Ser 1.30 (H) 0.61 - 1.24 mg/dL   Glucose, Bld 124 (H) 65 - 99 mg/dL   Calcium, Ion 1.19 1.15 - 1.40 mmol/L   TCO2 30 0 - 100 mmol/L   Hemoglobin 15.3 13.0 - 17.0 g/dL   HCT 45.0 39.0 - 52.0 %  CBG monitoring, ED     Status: Abnormal   Collection Time: 03/14/16 11:01  AM  Result Value Ref Range   Glucose-Capillary 115 (H) 65 - 99 mg/dL  Urine rapid drug screen (hosp performed)not at [facility]     Status: None   Collection Time: 03/14/16  1:27 PM  Result Value Ref Range   Opiates NONE DETECTED NONE DETECTED   Cocaine NONE DETECTED NONE DETECTED   Benzodiazepines NONE DETECTED NONE DETECTED   Amphetamines NONE DETECTED NONE DETECTED   Tetrahydrocannabinol NONE DETECTED NONE DETECTED   Barbiturates NONE DETECTED NONE DETECTED    Comment:        DRUG SCREEN FOR MEDICAL PURPOSES ONLY.  IF CONFIRMATION IS NEEDED FOR ANY PURPOSE, NOTIFY LAB WITHIN 5 DAYS.        LOWEST DETECTABLE LIMITS FOR URINE DRUG SCREEN Drug Class       Cutoff (ng/mL) Amphetamine      1000 Barbiturate      200 Benzodiazepine   062 Tricyclics       376 Opiates          300 Cocaine          300 THC              50   Urinalysis, Routine w reflex microscopic (not at [facility])     Status: None   Collection Time: 03/14/16  1:27 PM  Result Value Ref Range   Color, Urine YELLOW YELLOW   APPearance CLEAR CLEAR   Specific Gravity, Urine 1.008 1.005 - 1.030   pH 7.5 5.0 - 8.0   Glucose, UA NEGATIVE NEGATIVE mg/dL   Hgb urine dipstick NEGATIVE NEGATIVE   Bilirubin Urine NEGATIVE NEGATIVE   Ketones, ur NEGATIVE NEGATIVE mg/dL   Protein, ur NEGATIVE NEGATIVE mg/dL   Nitrite NEGATIVE NEGATIVE   Leukocytes, UA NEGATIVE NEGATIVE    Comment: MICROSCOPIC NOT DONE ON URINES WITH NEGATIVE PROTEIN, BLOOD, LEUKOCYTES, NITRITE, OR GLUCOSE <1000 mg/dL.  Protime-INR     Status: None   Collection Time: 03/14/16  2:50 PM  Result Value Ref Range   Prothrombin Time 13.1 11.4 - 15.2 seconds   INR 0.99   APTT     Status: None   Collection Time: 03/14/16  2:50 PM  Result Value Ref Range   aPTT 26 24 - 36 seconds  Troponin I     Status: None   Collection Time: 03/14/16  2:50 PM  Result Value Ref Range   Troponin I <0.03 <0.03 ng/mL  Hemoglobin A1c     Status: None   Collection Time: 03/15/16  3:24 AM   Result Value Ref Range   Hgb A1c MFr Bld 5.2 4.8 - 5.6 %    Comment: (NOTE)         Pre-diabetes: 5.7 - 6.4         Diabetes: >6.4         Glycemic control for adults with diabetes: <7.0    Mean Plasma Glucose 103 mg/dL    Comment: (NOTE) Performed At: [facility] [address] [place], [place] [id number] Lindon Romp MD YW:[id number]   Lipid  panel     Status: None   Collection Time: 03/15/16  3:24 AM  Result Value Ref Range   Cholesterol 192 0 - 200 mg/dL   Triglycerides 71 <150 mg/dL   HDL 82 >40 mg/dL   Total CHOL/HDL Ratio 2.3 RATIO   VLDL 14 0 - 40 mg/dL   LDL Cholesterol 96 0 - 99 mg/dL    Comment:        Total Cholesterol/HDL:CHD Risk Coronary Heart Disease Risk Table                     Men   Women  1/2 Average Risk   3.4   3.3  Average Risk       5.0   4.4  2 X Average Risk   9.6   7.1  3 X Average Risk  23.4   11.0        Use the calculated Patient Ratio above and the CHD Risk Table to determine the patient's CHD Risk.        ATP III CLASSIFICATION (LDL):  <100     mg/dL   Optimal  100-129  mg/dL   Near or Above                    Optimal  130-159  mg/dL   Borderline  160-189  mg/dL   High  >190     mg/dL   Very High   Comprehensive metabolic panel     Status: Abnormal   Collection Time: 03/15/16  8:44 AM  Result Value Ref Range   Sodium 137 135 - 145 mmol/L   Potassium 3.4 (L) 3.5 - 5.1 mmol/L   Chloride 102 101 - 111 mmol/L   CO2 25 22 - 32 mmol/L   Glucose, Bld 130 (H) 65 - 99 mg/dL   BUN 11 6 - 20 mg/dL   Creatinine, Ser 1.08 0.61 - 1.24 mg/dL   Calcium 9.2 8.9 - 10.3 mg/dL   Total Protein 6.7 6.5 - 8.1 g/dL   Albumin 3.6 3.5 - 5.0 g/dL   AST 28 15 - 41 U/L   ALT 14 (L) 17 - 63 U/L   Alkaline Phosphatase 52 38 - 126 U/L   Total Bilirubin 1.8 (H) 0.3 - 1.2 mg/dL   GFR calc non Af Amer 57 (L) >60 mL/min   GFR calc Af Amer >60 >60 mL/min    Comment: (NOTE) The eGFR has been calculated using the CKD EPI equation. This  calculation has not been validated in all clinical situations. eGFR's persistently <60 mL/min signify possible Chronic Kidney Disease.    Anion gap 10 5 - 15  Lipase, blood     Status: None   Collection Time: 03/15/16  1:55 PM  Result Value Ref Range   Lipase 21 11 - 51 U/L  CBC     Status: Abnormal   Collection Time: 03/16/16  3:07 AM  Result Value Ref Range   WBC 7.4 4.0 - 10.5 K/uL   RBC 4.16 (L) 4.22 - 5.81 MIL/uL   Hemoglobin 13.1 13.0 - 17.0 g/dL   HCT 39.7 39.0 - 52.0 %   MCV 95.4 78.0 - 100.0 fL   MCH 31.5 26.0 - 34.0 pg   MCHC 33.0 30.0 - 36.0 g/dL   RDW 14.3 11.5 - 15.5 %   Platelets 153 150 - 400 K/uL     Lipid Panel     Component Value Date/Time   CHOL 192  03/15/2016 0324   TRIG 71 03/15/2016 0324   HDL 82 03/15/2016 0324   CHOLHDL 2.3 03/15/2016 0324   VLDL 14 03/15/2016 0324   LDLCALC 96 03/15/2016 0324     Lab Results  Component Value Date   HGBA1C 5.2 03/15/2016        HPI :*  82 y.o.malewho was in his normal state of health this morning when he developed right facial weakness and numbness. He noticed this when he was discussing with his wife noticed that he had slurred speech. First states he had spoken to her room 7 AM 03/14/2016 and said had been normal at that time (LKW), however story was unclear and inconsistent, time LKW kept changing. Poor history. Patient was not administered IV t-PA secondary to unclear time of osnet. He was admitted for further evaluation and treatment.   HOSPITAL COURSE:   Acute Left MCA territory infarct on MRI likely the setting of hypertensive urgency MRA HEAD: No emergent large vessel occlusion or severe stenosis. 2-D echo  results as above Carotid Doppler  1-39% internal carotid artery stenosis bilaterally. Vertebral arteries are patent with antegrade flow. Currently on aspirin 325, may need to switch to Plavix? Lipid panel- LDL 96, initiate statin TEE requested, given advanced age, cardiology decline the  request.   LDL 96 now on aspirin 325 mg daily. Recommend to switch to Plavix for further stroke prevention. Hemoglobin A1c 5.2 According to physical therapy recommendations patient is being discharged with home health  Hypertensive urgency-continue atenolol Maxair discontinued, dose of Vasotec adjusted  Chronic kidney disease stage baseline creatinine 1.2Current Cr 1.3  Follow renal function closely  Nausea, liver function appears normal, lipase within normal limits Patient states that he has chronic loose stools. Does not feel that this problem is exacerbated at this time     Discharge Exam:   Blood pressure (!) 157/88, pulse 80, temperature 98.2 F (36.8 C), temperature source Oral, resp. rate 16, height 5' 7.5" (1.715 m), weight 75.3 kg (166 lb 0.1 oz), SpO2 98 %.   General exam: Appears calm and comfortable  Respiratory system: Clear to auscultation. Respiratory effort normal. Cardiovascular system: S1 & S2 heard, RRR. No JVD, murmurs, rubs, gallops or clicks. No pedal edema. Gastrointestinal system: Abdomen is nondistended, soft and nontender. No organomegaly or masses felt. Normal bowel sounds heard. Central nervous system: Alert and oriented. No focal neurological deficits. Extremities: Symmetric 5 x 5 power. Skin: No rashes, lesions or ulcers Psychiatry: Judgement and insight appear normal. Mood & affect appropriate.       SignedReyne Dumas 03/16/2016, 10:35 AM        Time spent >45 mins

## 2016-03-16 NOTE — Consult Note (Signed)
ELECTROPHYSIOLOGY CONSULT NOTE  Patient ID: Brendan Gonzalez MRN: [id number], DOB/AGE: 12-10-1919   Admit date: 03/14/2016 Date of Consult: 03/16/2016  Primary Physician: Tammi Sou, MD Primary Cardiologist: None Reason for Consultation: Cryptogenic stroke ; recommendations regarding Implantable Loop Recorder  History of Present Illness Brendan Gonzalez was admitted on 03/14/2016 with R facial droop and slurred speech, not back to baseline yet.  They first developed symptoms while at home. PMHx noted CRI, HTN, chronic back pain.  Imaging demonstrated left frontal infarct. Small vessel disease.    he has undergone workup for stroke including echocardiogram and carotid angio.  The patient has been monitored on telemetry which has demonstrated sinus rhythm with PACs, no arrhythmias. There are no plans for TEE given advanced age though neurology would still like loop implant for evaluation of potential AFib.   The patient live independently with his wife, states his sone checks in and stays a couple nights here and there.  He ambulates with a walker and denies any falls or bleeding history.   Echocardiogram this admission demonstrated  Study Conclusions - Left ventricle: The cavity size was normal. Systolic function was   normal. Wall motion was normal; there were no regional wall   motion abnormalities. There was an increased relative   contribution of atrial contraction to ventricular filling.   Doppler parameters are consistent with abnormal left ventricular   relaxation (grade 1 diastolic dysfunction). - Aortic valve: There was trivial regurgitation. - Aorta: Aortic root dimension: 45 mm (ED). Ascending aortic   diameter: 38 mm (S). - Aortic root: The aortic root was moderately dilated. - Ascending aorta: The ascending aorta was mildly dilated.   Lab work is reviewed.  Prior to admission, the patient denies chest pain, shortness of breath, dizziness, palpitations, or syncope.  They  are recovering from their stroke with plans to home at discharge.  EP has been asked to evaluate for placement of an implantable loop recorder to monitor for atrial fibrillation.     Past Medical History:  Diagnosis Date  . Chronic renal insufficiency, stage III (moderate)    CrCl 40s  . Colon cancer ([place]) 1993  . Hypertension   . Lumbar spinal stenosis    Surgery 2012 helped but in 2015 pain has returned  . Osteoarthritis      Surgical History:  Past Surgical History:  Procedure Laterality Date  . CHOLECYSTECTOMY  1984  . INGUINAL HERNIA REPAIR  1974   [place]at  . LUMBAR SPINE SURGERY  2012   for spinal stenosis (Dr. Jonelle [place] in [place])  . LUNG LOBECTOMY  1993   LLL per pt--worry of possible cancer but turned out to be benign.  [name] PARTIAL COLECTOMY  1993   for colon cancer     Prescriptions Prior to Admission  Medication Sig Dispense Refill Last Dose  . aspirin 325 MG tablet Take 325 mg by mouth every 6 (six) hours as needed for mild pain.    Past Month at Unknown time  . atenolol (TENORMIN) 100 MG tablet Take 1/2 Tablets By Mouth Twice A Day (Patient taking differently: Take 100 mg by mouth daily. ) 90 tablet 3 03/13/2016 at 0700  . cholecalciferol (VITAMIN D) 1000 UNITS tablet Take 1,000 Units by mouth daily.   Past Month at Unknown time  . HYDROcodone-acetaminophen (NORCO) 5-325 MG tablet Take 0.5 tablets by mouth every 6 (six) hours as needed for severe pain. 60 tablet 0 Past Month at Unknown time  . Hypromellose (ARTIFICIAL  TEARS OP) Place 1 drop into both eyes daily.   03/13/2016 at Unknown time  . triamterene-hydrochlorothiazide (MAXZIDE) 75-50 MG per tablet Take 1 tablet by mouth daily. (Patient taking differently: Take 0.5 tablets by mouth 2 (two) times daily. ) 90 tablet 3 Past Month at Unknown time  . vitamin C (ASCORBIC ACID) 500 MG tablet Take 500 mg by mouth daily.   Past Month at Unknown time  . vitamin E 400 UNIT capsule Take 400 Units by mouth every other day.   Past  Month at Unknown time  . [DISCONTINUED] enalapril (VASOTEC) 5 MG tablet Take 1 tablet (5 mg total) by mouth 2 (two) times daily. (Patient not taking: Reported on 03/14/2016) 60 tablet 1 Not Taking at Unknown time    Inpatient Medications:  . artificial tears   Both Eyes Daily  . atenolol  100 mg Oral Daily  . atorvastatin  10 mg Oral q1800  . clopidogrel  75 mg Oral Daily  . enalapril  5 mg Oral Daily  . heparin  5,000 Units Subcutaneous Q8H    Allergies: No Known Allergies  Social History   Social History  . Marital status: Married    Spouse name: N/A  . Number of children: N/A  . Years of education: N/A   Occupational History  . Not on file.   Social History Main Topics  . Smoking status: Former Research scientist (life sciences)  . Smokeless tobacco: Never Used  . Alcohol use 0.6 - 1.2 oz/week    1 - 2 Cans of beer per week     Comment: most days  . Drug use: No  . Sexual activity: Not on file   Other Topics Concern  . Not on file   Social History Narrative   Married, 1 son and 1 daughter.   Education: BS in Pharmacologist.   Occupation: Pharmacologist (retired).   As of 01/2014, living at [facility] with his wife.   No T/A/Ds.       Family History  Problem Relation Age of Onset  . Parkinson's disease Mother   . Heart attack Mother   . Cancer Brother   . Cancer Daughter       Review of Systems: All other systems reviewed and are otherwise negative except as noted above.  Physical Exam: Vitals:   03/16/16 0120 03/16/16 0157 03/16/16 0707 03/16/16 0900  BP: (!) 144/76 (!) 170/94 (!) 154/91 (!) 157/88  Pulse: (!) 104 82 75 80  Resp: 20 16 16 16   Temp: 97.8 F (36.6 C) 98.1 F (36.7 C) 98.2 F (36.8 C) 98.2 F (36.8 C)  TempSrc: Oral Oral Oral Oral  SpO2: 94% 99% 97% 98%  Weight:      Height:        GEN- The patient is well appearing, alert and oriented x 3 today.   Head- normocephalic, atraumatic Eyes-  Sclera clear, conjunctiva pink Ears- hearing intact Oropharynx-  clear Neck- supple Lungs- Clear to ausculation bilaterally, normal work of breathing Heart- Regular rate and rhythm, soft SM, rubs or gallops  GI- soft, NT, ND Extremities- no clubbing, cyanosis, or edema MS- no significant deformity or atrophy Skin- no rash or lesion Psych- euthymic mood, full affect   Labs:   Lab Results  Component Value Date   WBC 7.4 03/16/2016   HGB 13.1 03/16/2016   HCT 39.7 03/16/2016   MCV 95.4 03/16/2016   PLT 153 03/16/2016    Recent Labs Lab 03/15/16 0844  NA 137  K 3.4*  CL 102  CO2 25  BUN 11  CREATININE 1.08  CALCIUM 9.2  PROT 6.7  BILITOT 1.8*  ALKPHOS 52  ALT 14*  AST 28  GLUCOSE 130*   Lab Results  Component Value Date   TROPONINI <0.03 03/14/2016   Lab Results  Component Value Date   CHOL 192 03/15/2016   CHOL 194 10/08/2015   Lab Results  Component Value Date   HDL 82 03/15/2016   HDL 75.10 10/08/2015   Lab Results  Component Value Date   LDLCALC 96 03/15/2016   LDLCALC 96 10/08/2015   Lab Results  Component Value Date   TRIG 71 03/15/2016   TRIG 115.0 10/08/2015   Lab Results  Component Value Date   CHOLHDL 2.3 03/15/2016   CHOLHDL 3 10/08/2015   No results found for: LDLDIRECT  No results found for: DDIMER   Radiology/Studies:  Dg Chest 2 View Result Date: 03/14/2016 CLINICAL DATA:  Right facial weakness and slurred speech. History of hypertension. EXAM: CHEST  2 VIEW COMPARISON:  11/15/2014 FINDINGS: Cardiac silhouette is borderline enlarged. No mediastinal or hilar masses. No evidence adenopathy. There are surgical vascular clips along the left hilum, stable. Mild stable linear scarring is noted in the lung bases. Lungs are otherwise clear. No pleural effusion or pneumothorax. Skeletal structures are diffusely demineralized but grossly intact. IMPRESSION: No acute cardiopulmonary disease. Electronically Signed   By: Lajean Manes M.D.   On: 03/14/2016 14:51   Mr Jodene Nam Neck W Wo Contrast Result Date:  03/14/2016 CLINICAL DATA:  RIGHT facial weakness and slurred speech beginning at breakfast. History of colon cancer, hypertension and chronic kidney disease. EXAM: MRI HEAD WITH AND WITHOUT CONTRAST MRA HEAD WITHOUT CONTRAST MRA NECK WITHOUT AND WITH CONTRAST TECHNIQUE: Multiplanar, multiecho pulse sequences of the brain and surrounding structures were obtained with and without intravenous contrast. Angiographic images of the Circle of Willis were obtained using MRA technique without intravenous contrast. Angiographic images of the neck were obtained using MRA technique without and with intravenous contrast. Carotid stenosis measurements (when applicable) are obtained utilizing NASCET criteria, using the distal internal carotid diameter as the denominator. CONTRAST:  64mL MULTIHANCE GADOBENATE DIMEGLUMINE 529 MG/ML IV SOLN COMPARISON:  CT HEAD March 14, 2016 at 1044 hours FINDINGS: MRI HEAD FINDINGS INTRACRANIAL CONTENTS: 19 x 6 mm area of reduced diffusion LEFT posterior frontal lobe/ precentral cortex to subcortical white matter with low ADC values and bright FLAIR T2 hyperintense signal. No susceptibility artifact to suggest hemorrhage. The ventricles and sulci are normal for patient's age. No suspicious parenchymal signal, masses, mass effect. Confluent supratentorial white matter FLAIR T2 hyperintensities. 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 No abnormal intraparenchymal or extra-axial enhancement. No abnormal extra-axial fluid collections. No extra-axial masses. VASCULAR: Normal major intracranial vascular flow voids present at skull base. SKULL AND UPPER CERVICAL SPINE: No abnormal sellar expansion. No suspicious calvarial bone marrow signal. Craniocervical junction maintained. Pannus posterior to the odontoid process partially effaces the ventral cerebral spinal fluid spaces, compatible with CPPD. Acute probable discogenic endplate changes [id number], partially imaged. SINUSES/ORBITS: The mastoid air-cells and  included paranasal sinuses are well-aerated. Status post bilateral ocular lens implants. The included ocular globes and orbital contents are non-suspicious. OTHER: None. MRA HEAD FINDINGS ANTERIOR CIRCULATION: Arch vessel origins not included in field of view. Normal flow related enhancement of the included cervical, petrous, cavernous and supraclinoid internal carotid arteries. Patent anterior communicating artery. Normal flow related enhancement of the anterior and middle cerebral arteries, including distal segments. Mild dolichoectasia. No  large vessel occlusion, high-grade stenosis, abnormal luminal irregularity, aneurysm. POSTERIOR CIRCULATION: Codominant vertebral artery's. Basilar artery is patent, with normal flow related enhancement of the main branch vessels. Normal flow related enhancement of the posterior cerebral arteries. Small dolichoectasia. Mild spurious stenosis bilateral P2 segments not present on source images compatible with tortuosity. No large vessel occlusion, high-grade stenosis, abnormal luminal irregularity, aneurysm. MRA NECK FINDINGS ANTERIOR CIRCULATION: The common carotid arteries are widely patent bilaterally. The carotid bifurcation is patent bilaterally and there is no hemodynamically significant carotid stenosis by NASCET criteria. Luminal irregularity LEFT lateral mid cervical internal carotid artery with patulous vessel distally. No discrete dissection flap. No evidence for atherosclerosis or flow limiting stenosis of the cervical internal carotid arteries. POSTERIOR CIRCULATION: Bilateral vertebral arteries are patent to the vertebrobasilar junction. No evidence for atherosclerosis or flow limiting stenosis. Source images and MIP image were reviewed. 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 Moderate chronic small vessel ischemic disease. MRA HEAD: No emergent large vessel occlusion or severe stenosis. Dolichoectasia compatible  chronic hypertension. 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 Distal LEFT cervical internal carotid artery dolichoectasia without focal aneurysm. No hemodynamically significant stenosis. Electronically Signed   By: Elon Alas M.D.   On: 03/14/2016 23:42   Ct Head Code Stroke W/o Cm Result Date: 03/14/2016 CLINICAL DATA:  Code stroke. 82 year old hypertensive male with slurred speech and right facial droop. Symptoms are better. Initial encounter. EXAM: CT HEAD WITHOUT CONTRAST TECHNIQUE: Contiguous axial images were obtained from the base of the skull through the vertex without intravenous contrast. COMPARISON:  None. FINDINGS: Brain: No intracranial hemorrhage. Chronic microvascular changes without CT evidence of large acute infarct. Global atrophy without hydrocephalus. No intracranial mass lesion noted on this unenhanced exam. Vascular: Vascular calcifications with ectasia most notable involving the left internal carotid artery supraclinoid segment Skull: No acute abnormality. Sinuses/Orbits: No acute orbital abnormality post lens replacement Partial opacification anterior left ethmoid sinus air cell. Other: Degenerative changes temporal mandibular joint. ASPECTS [facility] Stroke Program Early CT Score) - Ganglionic level infarction (caudate, lentiform nuclei, internal capsule, insula, M1-M3 cortex): 7 - Supraganglionic infarction (M4-M6 cortex): 3 Total score (0-10 with 10 being normal): 10 IMPRESSION: 1. Chronic microvascular changes without CT evidence of large acute infarct. No intracranial hemorrhage. Atrophy. Vascular calcifications with ectasia most notable involving the left internal carotid artery supraclinoid segment 2. ASPECTS is 10 These results were called by telephone at the time of interpretation on 03/14/2016 at 11:01 am to Dr. Leonel Ramsay, who verbally acknowledged these  results. Electronically Signed   By: Genia Del M.D.   On: 03/14/2016 11:05    12-lead ECG SR All prior EKG's in EPIC reviewed with no documented atrial fibrillation  Telemetry SR, frequent APCs  Assessment and Plan:  1. Cryptogenic stroke The patient presents with cryptogenic stroke.    I spoke at length with the patient about monitoring for afib with either a 30 day event monitor or an implantable loop recorder.  Risks, benefits, and alteratives to implantable loop recorder were discussed with the patient today.   At this time, the patient tells me he is somewhat undecided, but would pursue implant if the doctor feels it is needed, will await his discussion with Dr. Rayann Heman and final decision regarding implantable loop recorder.    Renee Dyane Dustman, PA-C 03/16/2016   I have seen, examined the patient, and reviewed the above assessment  and plan.  On exam, RRR.  Changes to above are made where necessary.   I have spoken with the patient at length about monitoring with either a 30 day monitor or with an implantable loop recorder.  The patient is clear that he is not interested in any monitoring at this time.  Given his advanced age and preferences, I think a conservative approach is best.  IF he changes his mind, a 30 day monitor can be arranged as an outpatient. No further EP workup planned.  Electrophysiology team to see as needed while here. Please call with questions.   Co Sign: Thompson Grayer, MD 03/16/2016 2:47 PM

## 2016-03-17 ENCOUNTER — Encounter: Payer: Self-pay | Admitting: Family Medicine

## 2016-03-17 ENCOUNTER — Telehealth: Payer: Self-pay

## 2016-03-17 NOTE — Progress Notes (Signed)
STROKE TEAM PROGRESS NOTE   SUBJECTIVE (INTERVAL HISTORY) No family at bedside. Patient is lying in bed, awake alert, fully orientated, right facial droop improved. TEE declined by cardiology and pt declined cardiac monitoring.    OBJECTIVE Temp:  [97.8 F (36.6 C)-98.4 F (36.9 C)] 98.4 F (36.9 C) (12/06 1900) Pulse Rate:  [71-104] 71 (12/06 1900) Cardiac Rhythm: Other (Comment) (12/06 1227) Resp:  [16-20] 16 (12/06 1900) BP: (139-170)/(76-94) 139/77 (12/06 1900) SpO2:  [94 %-99 %] 98 % (12/06 1900)  CBC:   Recent Labs Lab 03/14/16 1042 03/14/16 1047 03/16/16 0307  WBC 8.2  --  7.4  NEUTROABS 5.4  --   --   HGB 14.9 15.3 13.1  HCT 44.2 45.0 39.7  MCV 95.1  --  95.4  PLT 183  --  0000000    Basic Metabolic Panel:   Recent Labs Lab 03/14/16 1042 03/14/16 1047 03/15/16 0844  NA 139 139 137  K 4.0 3.9 3.4*  CL 105 101 102  CO2 26  --  25  GLUCOSE 125* 124* 130*  BUN 16 22* 11  CREATININE 1.28* 1.30* 1.08  CALCIUM 9.6  --  9.2    Lipid Panel:     Component Value Date/Time   CHOL 192 03/15/2016 0324   TRIG 71 03/15/2016 0324   HDL 82 03/15/2016 0324   CHOLHDL 2.3 03/15/2016 0324   VLDL 14 03/15/2016 0324   LDLCALC 96 03/15/2016 0324   HgbA1c:  Lab Results  Component Value Date   HGBA1C 5.2 03/15/2016   Urine Drug Screen:     Component Value Date/Time   LABOPIA NONE DETECTED 03/14/2016 1327   COCAINSCRNUR NONE DETECTED 03/14/2016 1327   LABBENZ NONE DETECTED 03/14/2016 1327   AMPHETMU NONE DETECTED 03/14/2016 1327   THCU NONE DETECTED 03/14/2016 1327   LABBARB NONE DETECTED 03/14/2016 1327      IMAGING I have personally reviewed the radiological images below and agree with the radiology interpretations.  Dg Chest 2 View 03/14/2016 No acute cardiopulmonary disease.   Ct Head Code Stroke W/o Cm 03/14/2016 1. Chronic microvascular changes without CT evidence of large acute infarct. No intracranial hemorrhage. Atrophy. Vascular calcifications with  ectasia most notable involving the left internal carotid artery supraclinoid segment 2. ASPECTS is 10   MRI HEAD 03/14/2016 Small acute LEFT frontal lobe nonhemorrhagic infarct, MCA territory. Moderate chronic small vessel ischemic disease.   MRA HEAD 03/14/2016 No emergent large vessel occlusion or severe stenosis. Dolichoectasia compatible chronic hypertension.   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 Distal LEFT cervical internal carotid artery dolichoectasia without focal aneurysm. No hemodynamically significant stenosis.   Carotid Doppler   There is 1-39% bilateral ICA stenosis. Vertebral artery flow is antegrade.    2D Echocardiogram  Left ventricle: The cavity size was normal. Systolic function was   normal. Wall motion was normal; there were no regional wall   motion abnormalities. There was an increased relative   contribution of atrial contraction to ventricular filling.   Doppler parameters are consistent with abnormal left ventricular   relaxation (grade 1 diastolic dysfunction). - Aortic valve: There was trivial regurgitation. - Aorta: Aortic root dimension: 45 mm (ED). Ascending aortic   diameter: 38 mm (S). - Aortic root: The aortic root was moderately dilated. - Ascending aorta: The ascending aorta was mildly dilated.   PHYSICAL EXAM  Temp:  [97.8 F (36.6 C)-98.4 F (36.9 C)]  98.4 F (36.9 C) (12/06 1900) Pulse Rate:  [71-104] 71 (12/06 1900) Resp:  [16-20] 16 (12/06 1900) BP: (139-170)/(76-94) 139/77 (12/06 1900) SpO2:  [94 %-99 %] 98 % (12/06 1900)  General - Well nourished, well developed, in no apparent distress.  Ophthalmologic -  Fundi not visualized due to eye movement.  Cardiovascular - Regular rate and rhythm.  Mental Status -  Level of arousal and orientation to time, place, and person were intact. Language including  expression, naming, repetition, comprehension was assessed and found intact. Fund of Knowledge was assessed and was intact.  Cranial Nerves II - XII - II - Visual field intact OU. III, IV, VI - Extraocular movements intact. V - Facial sensation intact bilaterally. VII - mild right facial droop. VIII - Hearing & vestibular intact bilaterally. X - Palate elevates symmetrically. XI - Chin turning & shoulder shrug intact bilaterally. XII - Tongue protrusion intact.  Motor Strength - The patient's strength was normal in all extremities and pronator drift was absent.  Bulk was normal and fasciculations were absent.   Motor Tone - Muscle tone was assessed at the neck and appendages and was normal.  Reflexes - The patient's reflexes were 1+ in all extremities and he had no pathological reflexes.  Sensory - Light touch, temperature/pinprick were assessed and were symmetrical.    Coordination - The patient had normal movements in the hands with no ataxia or dysmetria.  Tremor was absent.  Gait and Station - deferred due to safety concerns   ASSESSMENT/PLAN [name] is a 82 y.o. male with history of Hypertension, CKD 3, remote cancer presenting with R facial weakness. He did not receive IV t-PA due to unclear LKW.   Stroke:  left frontal infarct embolic secondary to unknown source. Concerning for cardioembolic source  Resultant  Mild right facial droop  MRI  left frontal infarct. Small vessel disease  MRA head  no significant stenosis  MRA neck no significant stenosis  Carotid Doppler unremarkable   2D Echo  unremarkable, no SOE  TEE requested, given advanced age, cardiology decline the request.   Pt declined cardiac monitoring at this time as per EP  LDL 96  HgbA1c 5.2  Heparin 5000 units sq tid for VTE prophylaxis Diet - low sodium heart healthy  aspirin 325 mg daily prior to admission, now on Plavix for further stroke prevention.  Patient counseled to be  compliant with his antithrombotic medications  Ongoing aggressive stroke risk factor management  Therapy recommendations:  No OT, HH PT  Disposition:  pending   Hypertensive Urgency  BP  As high as 213/129 in setting of neurologi symptoms   Permissive hypertension (OK if < 220/120) but gradually normalize in 5-7 days  Long-term BP goal normotensive  Hyperlipidemia  Home meds:  lipitor 10, resumed in hospital  LDL 96, goal < 70  Continue statin at discharge  Other Stroke Risk Factors  Advanced age  ETOH use, advised to drink no more than 2 drink(s) a day  Other Active Problems  CKD stage III  nasuea  Hospital day # 1  Neurology will sign off. Please call with questions. Pt will follow up with Dr. Erlinda Hong at [facility] in about 6 weeks. Thanks for the consult.   Rosalin Hawking, MD PhD Stroke Neurology 03/17/2016 12:28 AM   To contact Stroke Continuity provider, please refer to [email or web address] After hours, contact General Neurology

## 2016-03-17 NOTE — Telephone Encounter (Signed)
Noted  

## 2016-03-17 NOTE — Telephone Encounter (Signed)
Transition Care Management Follow-up Telephone Call   Date discharged? 03/16/16   How have you been since you were released from the hospital? "pretty good"   Do you understand why you were in the hospital? yes, "I had a stroke. The MRI said it affected my speech, that's why I'm not talking good"   Do you understand the discharge instructions? yes   Where were you discharged to? Home   Items Reviewed:  Medications reviewed: yes  Allergies reviewed: yes  Dietary changes reviewed: yes, no changes. Swallowing not affected by stroke. Able to chew and swallow without difficulty.  Referrals reviewed: no referrals   Functional Questionnaire:   Activities of Daily Living (ADLs):   He states they are independent in the following: ambulation, bathing and hygiene, feeding, continence, grooming, toileting and dressing States they require assistance with the following: none   Any transportation issues/concerns?: no   Any patient concerns? no   Confirmed importance and date/time of follow-up visits scheduled yes  Provider Appointment booked with PCP, Tuesday 03/22/16 @ 11:30.   Confirmed with patient if condition begins to worsen call PCP or go to the ER.  Patient was given the office number and encouraged to call back with question or concerns.  : yes

## 2016-03-19 DIAGNOSIS — I69392 Facial weakness following cerebral infarction: Secondary | ICD-10-CM | POA: Diagnosis not present

## 2016-03-19 DIAGNOSIS — Z7902 Long term (current) use of antithrombotics/antiplatelets: Secondary | ICD-10-CM | POA: Diagnosis not present

## 2016-03-19 DIAGNOSIS — M48061 Spinal stenosis, lumbar region without neurogenic claudication: Secondary | ICD-10-CM | POA: Diagnosis not present

## 2016-03-19 DIAGNOSIS — Z85038 Personal history of other malignant neoplasm of large intestine: Secondary | ICD-10-CM | POA: Diagnosis not present

## 2016-03-19 DIAGNOSIS — M1990 Unspecified osteoarthritis, unspecified site: Secondary | ICD-10-CM | POA: Diagnosis not present

## 2016-03-19 DIAGNOSIS — N183 Chronic kidney disease, stage 3 (moderate): Secondary | ICD-10-CM | POA: Diagnosis not present

## 2016-03-19 DIAGNOSIS — I69322 Dysarthria following cerebral infarction: Secondary | ICD-10-CM | POA: Diagnosis not present

## 2016-03-19 DIAGNOSIS — I129 Hypertensive chronic kidney disease with stage 1 through stage 4 chronic kidney disease, or unspecified chronic kidney disease: Secondary | ICD-10-CM | POA: Diagnosis not present

## 2016-03-22 ENCOUNTER — Ambulatory Visit (INDEPENDENT_AMBULATORY_CARE_PROVIDER_SITE_OTHER): Payer: Medicare Other | Admitting: Family Medicine

## 2016-03-22 ENCOUNTER — Encounter: Payer: Self-pay | Admitting: Family Medicine

## 2016-03-22 VITALS — BP 132/85 | HR 60 | Temp 97.9°F | Resp 16 | Ht 65.5 in | Wt 161.2 lb

## 2016-03-22 DIAGNOSIS — I69392 Facial weakness following cerebral infarction: Secondary | ICD-10-CM | POA: Diagnosis not present

## 2016-03-22 DIAGNOSIS — N182 Chronic kidney disease, stage 2 (mild): Secondary | ICD-10-CM | POA: Diagnosis not present

## 2016-03-22 DIAGNOSIS — Z9114 Patient's other noncompliance with medication regimen: Secondary | ICD-10-CM

## 2016-03-22 DIAGNOSIS — N179 Acute kidney failure, unspecified: Secondary | ICD-10-CM | POA: Diagnosis not present

## 2016-03-22 DIAGNOSIS — I69328 Other speech and language deficits following cerebral infarction: Secondary | ICD-10-CM | POA: Diagnosis not present

## 2016-03-22 DIAGNOSIS — I10 Essential (primary) hypertension: Secondary | ICD-10-CM

## 2016-03-22 DIAGNOSIS — I639 Cerebral infarction, unspecified: Secondary | ICD-10-CM

## 2016-03-22 DIAGNOSIS — Z91148 Patient's other noncompliance with medication regimen for other reason: Secondary | ICD-10-CM

## 2016-03-22 DIAGNOSIS — N2889 Other specified disorders of kidney and ureter: Secondary | ICD-10-CM

## 2016-03-22 LAB — CBC
HCT: 41 % (ref 39.0–52.0)
HEMOGLOBIN: 13.8 g/dL (ref 13.0–17.0)
MCHC: 33.6 g/dL (ref 30.0–36.0)
MCV: 95 fl (ref 78.0–100.0)
Platelets: 202 10*3/uL (ref 150.0–400.0)
RBC: 4.32 Mil/uL (ref 4.22–5.81)
RDW: 14.3 % (ref 11.5–15.5)
WBC: 8.4 10*3/uL (ref 4.0–10.5)

## 2016-03-22 LAB — COMPREHENSIVE METABOLIC PANEL
ALK PHOS: 53 U/L (ref 39–117)
ALT: 15 U/L (ref 0–53)
AST: 19 U/L (ref 0–37)
Albumin: 4 g/dL (ref 3.5–5.2)
BILIRUBIN TOTAL: 0.7 mg/dL (ref 0.2–1.2)
BUN: 27 mg/dL — AB (ref 6–23)
CO2: 27 meq/L (ref 19–32)
CREATININE: 1.14 mg/dL (ref 0.40–1.50)
Calcium: 9.4 mg/dL (ref 8.4–10.5)
Chloride: 102 mEq/L (ref 96–112)
GFR: 63.49 mL/min (ref >60.00)
GLUCOSE: 96 mg/dL (ref 70–99)
Potassium: 4.7 mEq/L (ref 3.5–5.1)
SODIUM: 137 meq/L (ref 135–145)
TOTAL PROTEIN: 6.4 g/dL (ref 6.0–8.3)

## 2016-03-22 NOTE — Progress Notes (Signed)
Pre visit review using our clinic review tool, if applicable. No additional management support is needed unless otherwise documented below in the visit note. 

## 2016-03-22 NOTE — Progress Notes (Signed)
03/22/2016  CC:  Chief Complaint  Patient presents with  . Hospitalization Follow-up    TCM    Patient is a 82 y.o. Caucasian male who presents accompanied by his son and wife for  hospital follow up, specifically [facility] face-to-face visit. Dates hospitalized: 12/4-12/6, 2017. Days since d/c from hospital: 6 Patient was discharged from hospital to [facility]. Reason for admission to hospital: CVA, hypertensive urgency. Date of interactive (phone) contact with patient and/or caregiver: 03/17/16.  I have reviewed patient's discharge summary plus pertinent specific notes, labs, and imaging from the hospitalization.   He had an acute left MCA territory infarct in the setting of hypertensive urgency.  No significant intracranial stenosis or carotid artery stenosis was found.  Echocardiogram showed some grade I DD but normal systolic function w/out signif valvular abnormality.  He was changed from ASA to plavix prior to d/c. No t-PA was given due to unclear time of onset of symptoms. It appears he was not taking his home meds over at least the last several months.  At admission he had difficulty speaking and R sided facial weakness.     Reports feeling well but has persistent mild R lower facial droop and some slurred speech that he states is improving gradually.  He has been set up with [facility] nursing, PT/OT/Speech.  PT came once and he told the person he didn't need them anymore.  No other therapists have come yet. He denies problems with chewing or swallowing.  Home bp monitoring since d/c home shows normal numbers.  He has had gradually worsening problem with short term memory as well as doing things like driving the wrong way on one way streets.  Son says pt recently forgot his daughter's last name.  Medication reconciliation was done today and patient is  taking meds as recommended by discharging hospitalist/specialist.    PMH:  Past Medical History:  Diagnosis  Date  . Chronic renal insufficiency, stage III (moderate)    CrCl 40s  . Colon cancer ([place]) 1993  . CVA (cerebral vascular accident) ([place])    Left MCA territory.  Carotid dopplers ok, Brain MRA ok, ECHO with grd I DD.  [name] Hypertension   . Lumbar spinal stenosis    Surgery 2012 helped but in 2015 pain has returned  . Osteoarthritis     PSH:  Past Surgical History:  Procedure Laterality Date  . CHOLECYSTECTOMY  1984  . INGUINAL HERNIA REPAIR  1974   [place]at  . LUMBAR SPINE SURGERY  2012   for spinal stenosis (Dr. Jonelle Sports in [place])  . LUNG LOBECTOMY  1993   LLL per pt--worry of possible cancer but turned out to be benign.  [name] PARTIAL COLECTOMY  1993   for colon cancer    MEDS:  Outpatient Medications Prior to Visit  Medication Sig Dispense Refill  . atenolol (TENORMIN) 100 MG tablet Take 1/2 Tablets By Mouth Twice A Day (Patient taking differently: Take 100 mg by mouth daily. ) 90 tablet 3  . atorvastatin (LIPITOR) 10 MG tablet Take 1 tablet (10 mg total) by mouth daily at 6 PM. 30 tablet 2  . cholecalciferol (VITAMIN D) 1000 UNITS tablet Take 1,000 Units by mouth daily.    . clopidogrel (PLAVIX) 75 MG tablet Take 1 tablet (75 mg total) by mouth daily. 30 tablet 1  . enalapril (VASOTEC) 10 MG tablet Take 1 tablet (10 mg total) by mouth 2 (two) times daily. 60 tablet 1  . HYDROcodone-acetaminophen (NORCO) 5-325  MG tablet Take 0.5 tablets by mouth every 6 (six) hours as needed for severe pain. 60 tablet 0  . Hypromellose (ARTIFICIAL TEARS OP) Place 1 drop into both eyes daily.    [name] senna-docusate (SENOKOT-S) 8.6-50 MG tablet Take 1 tablet by mouth at bedtime as needed for moderate constipation. 30 tablet 1  . vitamin C (ASCORBIC ACID) 500 MG tablet Take 500 mg by mouth daily.    . vitamin E 400 UNIT capsule Take 400 Units by mouth every other day.     No facility-administered medications prior to visit.   EXAM: BP 132/85 (BP Location: Left Arm, Patient Position: Sitting, Cuff Size:  Normal)   Pulse 60   Temp 97.9 F (36.6 C) (Oral)   Resp 16   Ht 5' 5.5" (1.664 m)   Wt 161 lb 4 oz (73.1 kg)   SpO2 99%   BMI 26.43 kg/m  Gen: Alert, well appearing.  Patient is oriented to person, place, and situation. [id number]: no injection, icteris, swelling, or exudate.  EOMI, PERRLA. Mouth: lips without lesion/swelling.  Oral mucosa pink and moist. Oropharynx without erythema, exudate, or swelling.   Tongue is midline, uvula elevates normally.  Forehead/eyes symmetric.  Right lower area of face with mild droop/weakness.   Neck: no mass, adenopathy, or TM.  Carotids 1+ bilat w/out bruit. CV: RRR, no m/r/g.   LUNGS: CTA bilat, nonlabored resps, good aeration in all lung fields. ABD: soft, NT/ND EXT: no clubbing, cyanosis, or edema.  Neuro: see above, plus strength 5/5 in proximal and distal upper extremities and lower extremities bilaterally.  No sensory deficits.  No tremor.  No ataxia.   No pronator drift.   Pertinent labs/imaging Lab Results  Component Value Date   WBC 7.4 03/16/2016   HGB 13.1 03/16/2016   HCT 39.7 03/16/2016   MCV 95.4 03/16/2016   PLT 153 03/16/2016     Chemistry      Component Value Date/Time   NA 137 03/15/2016 0844   K 3.4 (L) 03/15/2016 0844   CL 102 03/15/2016 0844   CO2 25 03/15/2016 0844   BUN 11 03/15/2016 0844   CREATININE 1.08 03/15/2016 0844      Component Value Date/Time   CALCIUM 9.2 03/15/2016 0844   ALKPHOS 52 03/15/2016 0844   AST 28 03/15/2016 0844   ALT 14 (L) 03/15/2016 0844   BILITOT 1.8 (H) 03/15/2016 0844      ASSESSMENT/PLAN:  Hospital follow up:   1) Acute nonhemorrhagic CVA, left MCA territory.  He is still getting a bit of function in his speech and in R lower face weakness since time of d/c.  PT/OT/Speech and [place] has been set up per family's report. I emphasized the importance of him being compliant with medication regimen to try to decrease chances of a second CVA.  He is now on plavix 75mg  qd, 2 bp  meds, and a statin. CBC and CMET today.  2) HTN; was uncontrolled due to pt noncompliance.  BP good now that he is back on atenolol 50mg  bid and enalapril 10mg  bid.  3) Acute-on-chronic renal insufficiency: this was improving while in hospital. Will follow with lytes/cr today.  4) Cognitive impairment: he is displaying signs of MCI vs dementia. Will try to eval this further at future visits.  At this time, I told him he should not drive.  Medical decision making of high complexity was utilized today.  An After Visit Summary was printed and given to the patient.  FOLLOW UP:  4 weeks  Signed:  Crissie Sickles, MD           03/22/2016

## 2016-03-31 ENCOUNTER — Telehealth: Payer: Self-pay | Admitting: Family Medicine

## 2016-03-31 ENCOUNTER — Other Ambulatory Visit: Payer: Self-pay | Admitting: *Deleted

## 2016-03-31 ENCOUNTER — Encounter: Payer: Self-pay | Admitting: *Deleted

## 2016-03-31 MED ORDER — AMLODIPINE BESYLATE 5 MG PO TABS: 5.0000 mg | ORAL_TABLET | Freq: Every day | ORAL | 6 refills | Status: DC

## 2016-03-31 NOTE — Telephone Encounter (Signed)
I just sent in eRx for amlodipine 5mg  that he should start taking once daily, beginning today.

## 2016-03-31 NOTE — Progress Notes (Signed)
This encounter was created in error - please disregard.

## 2016-03-31 NOTE — Telephone Encounter (Signed)
Please advise. Thanks.  

## 2016-03-31 NOTE — Addendum Note (Signed)
Addended by: Virgel Manifold on: 03/31/2016 10:10 AM   Modules accepted: Miquel Dunn

## 2016-03-31 NOTE — Telephone Encounter (Signed)
Angie, nurse with [facility] is seeing patient in home this morning.  Blood pressure reading is currently 182/100.  Patient reports his blood pressure has been running high in the mornings but usually drops down to about 140/70 in the afternoon.  Nurse is requesting a call back with instructions on what to do regarding elevated blood pressure.

## 2016-03-31 NOTE — Patient Outreach (Signed)
[facility]) Care Management  03/31/2016  DEMORRIS JANIS [birth year] [id number]  EMMI-Stroke referral with red dashboard for problem refilling medications:  Telephone call to patient who was advised of reason for call. Patient's spouse on call also. HIPPA verification received from patient. Patient voices that he has not had any problem getting prescriptions filled. States he is taking medications as instructed by his doctor. States he has had follow up appointment with his primary care-Dr. Genevie Ann. States no more stroke symptoms since return to home. States speech clearing up.   Spouse voices that patient has [facility] services in place since discharged to home.  States this case manager was asking same questions that they asked. States they did not need any further case management.  Patient not willing to answer any further assessment questions.   Plan: Close out case. Send educational stroke material to patient.   Sherrin Daisy, RN BSN [place] Management Coordinator [facility] Care Management  [phone]

## 2016-03-31 NOTE — Telephone Encounter (Signed)
Angie advised and voiced understanding. Pt advised and voiced understanding.

## 2016-03-31 NOTE — Addendum Note (Signed)
Addended by: Virgel Manifold on: 03/31/2016 10:26 AM   Modules accepted: Miquel Dunn

## 2016-04-01 ENCOUNTER — Encounter (HOSPITAL_BASED_OUTPATIENT_CLINIC_OR_DEPARTMENT_OTHER): Payer: Self-pay

## 2016-04-01 ENCOUNTER — Emergency Department (HOSPITAL_BASED_OUTPATIENT_CLINIC_OR_DEPARTMENT_OTHER): Payer: Medicare Other

## 2016-04-01 ENCOUNTER — Emergency Department (HOSPITAL_BASED_OUTPATIENT_CLINIC_OR_DEPARTMENT_OTHER)
Admission: EM | Admit: 2016-04-01 | Discharge: 2016-04-01 | Disposition: A | Discharge status: Home / Self Care | Payer: Medicare Other | Attending: Emergency Medicine | Admitting: Emergency Medicine

## 2016-04-01 ENCOUNTER — Telehealth: Payer: Self-pay | Admitting: Family Medicine

## 2016-04-01 DIAGNOSIS — I10 Essential (primary) hypertension: Secondary | ICD-10-CM

## 2016-04-01 DIAGNOSIS — I129 Hypertensive chronic kidney disease with stage 1 through stage 4 chronic kidney disease, or unspecified chronic kidney disease: Secondary | ICD-10-CM | POA: Diagnosis not present

## 2016-04-01 DIAGNOSIS — Z87891 Personal history of nicotine dependence: Secondary | ICD-10-CM | POA: Insufficient documentation

## 2016-04-01 DIAGNOSIS — N183 Chronic kidney disease, stage 3 (moderate): Secondary | ICD-10-CM | POA: Diagnosis not present

## 2016-04-01 DIAGNOSIS — Z79899 Other long term (current) drug therapy: Secondary | ICD-10-CM | POA: Insufficient documentation

## 2016-04-01 DIAGNOSIS — Z85038 Personal history of other malignant neoplasm of large intestine: Secondary | ICD-10-CM | POA: Insufficient documentation

## 2016-04-01 DIAGNOSIS — R51 Headache: Secondary | ICD-10-CM | POA: Diagnosis not present

## 2016-04-01 DIAGNOSIS — M542 Cervicalgia: Secondary | ICD-10-CM | POA: Insufficient documentation

## 2016-04-01 LAB — COMPREHENSIVE METABOLIC PANEL
ALBUMIN: 3.6 g/dL (ref 3.5–5.0)
ALT: 12 U/L — ABNORMAL LOW (ref 17–63)
ANION GAP: 9 (ref 5–15)
AST: 22 U/L (ref 15–41)
Alkaline Phosphatase: 57 U/L (ref 38–126)
BILIRUBIN TOTAL: 1 mg/dL (ref 0.3–1.2)
BUN: 16 mg/dL (ref 6–20)
CHLORIDE: 99 mmol/L — AB (ref 101–111)
CO2: 27 mmol/L (ref 22–32)
Calcium: 9.2 mg/dL (ref 8.9–10.3)
Creatinine, Ser: 1.06 mg/dL (ref 0.61–1.24)
GFR calc Af Amer: >60 mL/min (ref >60)
GFR calc non Af Amer: 58 mL/min — ABNORMAL LOW (ref >60)
GLUCOSE: 92 mg/dL (ref 65–99)
POTASSIUM: 4 mmol/L (ref 3.5–5.1)
SODIUM: 135 mmol/L (ref 135–145)
TOTAL PROTEIN: 6.9 g/dL (ref 6.5–8.1)

## 2016-04-01 LAB — CBC WITH DIFFERENTIAL/PLATELET
BASOS ABS: 0.1 10*3/uL (ref 0.0–0.1)
BASOS PCT: 0 %
EOS ABS: 0.3 10*3/uL (ref 0.0–0.7)
EOS PCT: 3 %
HCT: 42.3 % (ref 39.0–52.0)
Hemoglobin: 14.2 g/dL (ref 13.0–17.0)
Lymphocytes Relative: 9 %
Lymphs Abs: 1.1 10*3/uL (ref 0.7–4.0)
MCH: 31.8 pg (ref 26.0–34.0)
MCHC: 33.6 g/dL (ref 30.0–36.0)
MCV: 94.6 fL (ref 78.0–100.0)
MONO ABS: 1.3 10*3/uL — AB (ref 0.1–1.0)
Monocytes Relative: 12 %
Neutro Abs: 8.5 10*3/uL — ABNORMAL HIGH (ref 1.7–7.7)
Neutrophils Relative %: 76 %
PLATELETS: 204 10*3/uL (ref 150–400)
RBC: 4.47 MIL/uL (ref 4.22–5.81)
RDW: 13.4 % (ref 11.5–15.5)
WBC: 11.2 10*3/uL — AB (ref 4.0–10.5)

## 2016-04-01 LAB — TROPONIN I

## 2016-04-01 MED ORDER — AMLODIPINE BESYLATE 10 MG PO TABS: 10.0000 mg | ORAL_TABLET | Freq: Every day | ORAL | 0 refills | Status: DC

## 2016-04-01 MED ORDER — HYDRALAZINE HCL 20 MG/ML IJ SOLN
10.0000 mg | Freq: Once | INTRAMUSCULAR | Status: AC
  Administered 2016-04-01: 10 mg via INTRAVENOUS
  Filled 2016-04-01: qty 1

## 2016-04-01 MED ORDER — LISINOPRIL 10 MG PO TABS
10.0000 mg | ORAL_TABLET | Freq: Once | ORAL | Status: AC
  Administered 2016-04-01: 10 mg via ORAL
  Filled 2016-04-01: qty 1

## 2016-04-01 MED ORDER — IOPAMIDOL (ISOVUE-370) INJECTION 76%
100.0000 mL | Freq: Once | INTRAVENOUS | Status: AC | PRN
  Administered 2016-04-01: 100 mL via INTRAVENOUS

## 2016-04-01 MED ORDER — AMLODIPINE BESYLATE 5 MG PO TABS
5.0000 mg | ORAL_TABLET | Freq: Once | ORAL | Status: AC
  Administered 2016-04-01: 5 mg via ORAL
  Filled 2016-04-01: qty 1

## 2016-04-01 NOTE — Telephone Encounter (Signed)
Per Dr. Anitra Lauth please call pt back and have him speak to [facility].

## 2016-04-01 NOTE — ED Triage Notes (Addendum)
C/o neck pain started last night-denies injury-son and wife with pt-pt had stroke in Nov-son states pt was noncompliant with BP meds x 1 year-NAD-slow steady gait with own cane-BP at home was "204/178"-took additional BP meds then 190/111

## 2016-04-01 NOTE — ED Provider Notes (Signed)
[facility] DEPT [facility] Provider Note   CSN: [id number] Arrival date & time: 04/01/16  1231     History   Chief Complaint Chief Complaint  Patient presents with  . Hypertension    HPI Brendan Gonzalez is a 81 y.o. male hx of CKD, recent L frontal stroke, HTN, Presenting with neck pain, headache. Patient was recently admitted and had an MRI, MRA that showed left frontal and precentral stroke. Patient was started on Plavix, aspirin. Patient went home several days ago. Over the last several days, patient's blood pressure has been elevated and was 180/100 yesterday. Patient's doctor started patient on norvasc 5 mg daily in addition to his atenolol, enalapril. Overnight, he had R posterior neck pain radiate to the head. He had R facial droop and slurred speech since recent stroke but it didn't get worse. Patient denies any weakness or numbness.   The history is provided by the patient.    Past Medical History:  Diagnosis Date  . Chronic renal insufficiency, stage III (moderate)    CrCl 40s  . Colon cancer ([place]) 1993  . CVA (cerebral vascular accident) ([place])    Left MCA territory.  Carotid dopplers ok, Brain MRA ok, ECHO with grd I DD.  [name] Hypertension   . Lumbar spinal stenosis    Surgery 2012 helped but in 2015 pain has returned  . Osteoarthritis     Patient Active Problem List   Diagnosis Date Noted  . Cerebral embolism with cerebral infarction 03/15/2016  . Cryptogenic stroke ([place]) 03/15/2016  . Stroke-like symptoms 03/14/2016  . Slurred speech   . Essential hypertension   . Stage 3 chronic kidney disease   . Acute bronchitis 01/27/2014  . URI (upper respiratory infection) 01/27/2014    Past Surgical History:  Procedure Laterality Date  . CHOLECYSTECTOMY  1984  . INGUINAL HERNIA REPAIR  1974   Bilat  . LUMBAR SPINE SURGERY  2012   for spinal stenosis (Dr[place] Jonelle [place]  . LUNG LOBECTOMY  1993   LLL per pt--worry of possible cancer but turned out to be benign.  [name]  PARTIAL COLECTOMY  1993   for colon cancer       Home Medications    Prior to Admission medications   Medication Sig Start Date End Date Taking? Authorizing Provider  amLODipine (NORVASC) 5 MG tablet Take 1 tablet (5 mg total) by mouth daily. 03/31/16   Tammi Sou, MD  atenolol (TENORMIN) 100 MG tablet Take 1/2 Tablets By Mouth Twice A Day Patient taking differently: Take 100 mg by mouth daily.  06/18/14   Tammi Sou, MD  atorvastatin (LIPITOR) 10 MG tablet Take 1 tablet (10 mg total) by mouth daily at 6 PM. 03/16/16   Reyne Dumas, MD  cholecalciferol (VITAMIN D) 1000 UNITS tablet Take 1,000 Units by mouth daily.    Historical Provider, MD  clopidogrel (PLAVIX) 75 MG tablet Take 1 tablet (75 mg total) by mouth daily. 03/17/16   Reyne Dumas, MD  enalapril (VASOTEC) 10 MG tablet Take 1 tablet (10 mg total) by mouth 2 (two) times daily. 03/16/16   Reyne Dumas, MD  HYDROcodone-acetaminophen (NORCO) 5-325 MG tablet Take 0.5 tablets by mouth every 6 (six) hours as needed for severe pain. 01/21/16   Tammi Sou, MD  Hypromellose (ARTIFICIAL TEARS OP) Place 1 drop into both eyes daily.    Historical Provider, MD  senna-docusate (SENOKOT-S) 8.6-50 MG tablet Take 1 tablet by mouth at bedtime as needed for moderate  constipation. 03/16/16   Reyne Dumas, MD  vitamin C (ASCORBIC ACID) 500 MG tablet Take 500 mg by mouth daily.    Historical Provider, MD  vitamin E 400 UNIT capsule Take 400 Units by mouth every other day.    Historical Provider, MD    Family History Family History  Problem Relation Age of Onset  . Parkinson's disease Mother   . Heart attack Mother   . Cancer Brother   . Cancer Daughter     Social History Social History  Substance Use Topics  . Smoking status: Former Research scientist (life sciences)  . Smokeless tobacco: Never Used  . Alcohol use Yes     Comment: daily     Allergies   Patient has no known allergies.   Review of Systems Review of Systems  Musculoskeletal:  Positive for neck pain.  Neurological: Positive for headaches.  All other systems reviewed and are negative.    Physical Exam Updated Vital Signs BP 174/87   Pulse 86   Temp 98.3 F (36.8 C) (Oral)   Resp 16   Ht 5\' 7"  (1.702 m)   Wt 162 lb (73.5 kg)   SpO2 100%   BMI 25.37 kg/m   Physical Exam  Constitutional: He is oriented to person, place, and time.  Chronically ill   HENT:  Head: Normocephalic.  Mouth/Throat: Oropharynx is clear and moist.  Eyes: EOM are normal. Pupils are equal, round, and reactive to light.  Neck: Normal range of motion. Neck supple.  Cardiovascular: Normal rate, regular rhythm and normal heart sounds.   Pulmonary/Chest: Effort normal and breath sounds normal. No respiratory distress. He has no wheezes.  Abdominal: Soft. Bowel sounds are normal. He exhibits no distension. There is no tenderness. There is no guarding.  Musculoskeletal: Normal range of motion.  Neurological: He is alert and oriented to person, place, and time.  R facial droop (unchanged per patient and family), nl strength throughout, nl sensation throughout. Speech unchanged per family   Skin: Skin is warm.  Psychiatric: He has a normal mood and affect.  Nursing note and vitals reviewed.    ED Treatments / Results  Labs (all labs ordered are listed, but only abnormal results are displayed) Labs Reviewed  CBC WITH DIFFERENTIAL/PLATELET - Abnormal; Notable for the following:       Result Value   WBC 11.2 (*)    Neutro Abs 8.5 (*)    Monocytes Absolute 1.3 (*)    All other components within normal limits  COMPREHENSIVE METABOLIC PANEL - Abnormal; Notable for the following:    Chloride 99 (*)    ALT 12 (*)    GFR calc non Af Amer 58 (*)    All other components within normal limits  TROPONIN I    EKG  EKG Interpretation  Date/Time:  Friday April 01 2016 13:00:34 EST Ventricular Rate:  77 PR Interval:  196 QRS Duration: 74 QT Interval:  400 QTC Calculation: 452 R  Axis:   -19 Text Interpretation:  Normal sinus rhythm with sinus arrhythmia Inferior infarct , age undetermined Anterior infarct , age undetermined Abnormal ECG No significant change since last tracing Confirmed by Falana Clagg  MD, Flemon Kelty ([id number]) on 04/01/2016 1:08:47 PM       Radiology Ct Angio Head W Or Wo Contrast  Result Date: 04/01/2016 CLINICAL DATA:  Headache and neck pain.  Evaluate for dissection. EXAM: CT ANGIOGRAPHY HEAD AND NECK TECHNIQUE: Multidetector CT imaging of the head and neck was performed using the standard protocol during  bolus administration of intravenous contrast. Multiplanar CT image reconstructions and MIPs were obtained to evaluate the vascular anatomy. Carotid stenosis measurements (when applicable) are obtained utilizing NASCET criteria, using the distal internal carotid diameter as the denominator. CONTRAST:  100 cc Isovue 370 intravenous COMPARISON:  MRA head neck 03/14/2016 FINDINGS: CT HEAD FINDINGS Brain: No evidence of acute infarction, hemorrhage, hydrocephalus, extra-axial collection or mass lesion/mass effect. Vascular: Atherosclerotic calcification.  Further description below. Skull: No acute or aggressive finding Sinuses: Negative Orbits: Bilateral cataract resection and senescent calcifications. No acute finding Review of the MIP images confirms the above findings CTA NECK FINDINGS Aortic arch: Atheromatous wall thickening and calcification. No acute finding. 2 vessel branching Right carotid system: Mild atherosclerotic calcified plaque at the carotid and innominate bifurcation. No flow limiting stenosis, dissection, or ulceration. Distal ICA tortuosity with cervical loop. Left carotid system: Mild calcified plaque at the carotid bifurcation. Tortuous distal cervical ICA with loop. Mild luminal undulation is stable from previous MRA and likely related to tortuosity given limitation to the curved areas. Additionally, fibromuscular dysplasia is more extensive. There is no  flow limiting stenosis, dissection, or ulceration. Vertebral arteries: Prominent calcified plaque on the proximal left subclavian artery without flow limiting stenosis. Facet spurring causes mild intermittent distortion of the left vertebral artery. The vertebral arteries are smooth and widely patent. Skeleton: Advanced cervical spine degeneration. C5-6 ACDF. C4-5 advanced facet arthropathy with mild anterolisthesis. Remote T4 compression fracture. TMJ osteoarthritis. Other neck: Small bilateral pulmonary nodules, usually incidental at this size. No incidental adenopathy. Upper chest: No acute finding Review of the MIP images confirms the above findings CTA HEAD FINDINGS Anterior circulation: Mild ectasia of the left supraclinoid ICA. No saccular aneurysm. Atherosclerotic calcification predominately on the carotid siphons. No major branch occlusion or flow limiting stenosis. Posterior circulation: Symmetric vertebral arteries with mild atherosclerotic calcification. Bilaterals P2 segment stenoses, severe on the left and moderate on the right. No major branch occlusion. Negative for aneurysm Venous sinuses: Patent Anatomic variants: None noted Delayed phase: No parenchymal enhancement or mass. Review of the MIP images confirms the above findings IMPRESSION: 1. No acute finding.  Negative for dissection in the neck. 2. Mild atherosclerosis in the neck without stenosis. 3. Bilateral proximal PCA stenosis, severe on the left. Electronically Signed   By: Monte Fantasia M.D.   On: 04/01/2016 15:19   Ct Angio Neck W And/or Wo Contrast  Result Date: 04/01/2016 CLINICAL DATA:  Headache and neck pain.  Evaluate for dissection. EXAM: CT ANGIOGRAPHY HEAD AND NECK TECHNIQUE: Multidetector CT imaging of the head and neck was performed using the standard protocol during bolus administration of intravenous contrast. Multiplanar CT image reconstructions and MIPs were obtained to evaluate the vascular anatomy. Carotid stenosis  measurements (when applicable) are obtained utilizing NASCET criteria, using the distal internal carotid diameter as the denominator. CONTRAST:  100 cc Isovue 370 intravenous COMPARISON:  MRA head neck 03/14/2016 FINDINGS: CT HEAD FINDINGS Brain: No evidence of acute infarction, hemorrhage, hydrocephalus, extra-axial collection or mass lesion/mass effect. Vascular: Atherosclerotic calcification.  Further description below. Skull: No acute or aggressive finding Sinuses: Negative Orbits: Bilateral cataract resection and senescent calcifications. No acute finding Review of the MIP images confirms the above findings CTA NECK FINDINGS Aortic arch: Atheromatous wall thickening and calcification. No acute finding. 2 vessel branching Right carotid system: Mild atherosclerotic calcified plaque at the carotid and innominate bifurcation. No flow limiting stenosis, dissection, or ulceration. Distal ICA tortuosity with cervical loop. Left carotid system: Mild calcified plaque at  the carotid bifurcation. Tortuous distal cervical ICA with loop. Mild luminal undulation is stable from previous MRA and likely related to tortuosity given limitation to the curved areas. Additionally, fibromuscular dysplasia is more extensive. There is no flow limiting stenosis, dissection, or ulceration. Vertebral arteries: Prominent calcified plaque on the proximal left subclavian artery without flow limiting stenosis. Facet spurring causes mild intermittent distortion of the left vertebral artery. The vertebral arteries are smooth and widely patent. Skeleton: Advanced cervical spine degeneration. C5-6 ACDF. C4-5 advanced facet arthropathy with mild anterolisthesis. Remote T4 compression fracture. TMJ osteoarthritis. Other neck: Small bilateral pulmonary nodules, usually incidental at this size. No incidental adenopathy. Upper chest: No acute finding Review of the MIP images confirms the above findings CTA HEAD FINDINGS Anterior circulation: Mild  ectasia of the left supraclinoid ICA. No saccular aneurysm. Atherosclerotic calcification predominately on the carotid siphons. No major branch occlusion or flow limiting stenosis. Posterior circulation: Symmetric vertebral arteries with mild atherosclerotic calcification. Bilaterals P2 segment stenoses, severe on the left and moderate on the right. No major branch occlusion. Negative for aneurysm Venous sinuses: Patent Anatomic variants: None noted Delayed phase: No parenchymal enhancement or mass. Review of the MIP images confirms the above findings IMPRESSION: 1. No acute finding.  Negative for dissection in the neck. 2. Mild atherosclerosis in the neck without stenosis. 3. Bilateral proximal PCA stenosis, severe on the left. Electronically Signed   By: Monte Fantasia M.D.   On: 04/01/2016 15:19    Procedures Procedures (including critical care time)  Medications Ordered in ED Medications  amLODipine (NORVASC) tablet 5 mg (5 mg Oral Given 04/01/16 1339)  lisinopril (PRINIVIL,ZESTRIL) tablet 10 mg (10 mg Oral Given 04/01/16 1339)  hydrALAZINE (APRESOLINE) injection 10 mg (10 mg Intravenous Given 04/01/16 1353)  iopamidol (ISOVUE-370) 76 % injection 100 mL (100 mLs Intravenous Contrast Given 04/01/16 1428)     Initial Impression / Assessment and Plan / ED Course  I have reviewed the triage vital signs and the nursing notes.  Pertinent labs & imaging results that were available during my care of the patient were reviewed by me and considered in my medical decision making (see chart for details).  Clinical Course     Brendan Gonzalez is a 82 y.o. male here with hypertension, neck pain, headaches. Consider symptomatic hypertension vs dissection vs hemorrhagic stroke. I consulted Dr. Leonel Ramsay, who recommend CT angio head/neck and BP management. If CT neg, patient had recent workup for stroke and has no new deficits, so will not need MRI.   3:27 PM Labs at baseline. CT angio showed no  dissection. BP down to 170/87 from 210/100. Headache and neck pain resolved. Will have him continue enalapril, atenolol. Will increase norvasc to 10 mg daily. Recheck BP next week with PCP.   Final Clinical Impressions(s) / ED Diagnoses   Final diagnoses:  None    New Prescriptions New Prescriptions   No medications on file     Drenda Freeze, MD 04/01/16 1528

## 2016-04-01 NOTE — Telephone Encounter (Signed)
SW Diane she stated that pts son refused to have call triaged by [facility]   SW Dr. Anitra Lauth he stated that pt needs to go to ER to be treated since he is symptomatic.   SW pts son and advised him to take pt to ER, he voiced understanding. I asked to him to take their BP machine to the ER to have it checked. Pts son stated that the home health nurse checked it against her reading yesterday and it was reading appropriately. Pts son is going to take pt to [place] ER.

## 2016-04-01 NOTE — Discharge Instructions (Signed)
Continue enalapril and atenolol.   Increase norvasc to 10 mg daily.   See your doctor next week to recheck your blood pressure  Return to ER if you have severe headaches, neck pain, weakness, numbness, trouble speaking, chest pain, any signs of stroke

## 2016-04-01 NOTE — ED Notes (Signed)
Patient transported to CT 

## 2016-04-01 NOTE — Telephone Encounter (Signed)
Patient's bp reading 204/178 this morning & is having extreme neck pain that developed during the night. Please call back.

## 2016-04-01 NOTE — ED Notes (Signed)
Family at bedside. 

## 2016-04-01 NOTE — ED Notes (Signed)
Pt placed on cardiac monitor and BP cycled every 87min.

## 2016-04-08 ENCOUNTER — Telehealth: Payer: Self-pay | Admitting: Family Medicine

## 2016-04-08 NOTE — Telephone Encounter (Signed)
Patient went to ER-they changed patient amLODipine dose from 5 to 10 MG. This is just an FYI per patient as that he will be in for an appointment 04/20/16

## 2016-04-08 NOTE — Telephone Encounter (Signed)
FYI

## 2016-04-08 NOTE — Telephone Encounter (Signed)
OK. Noted 

## 2016-04-12 ENCOUNTER — Other Ambulatory Visit: Payer: Self-pay

## 2016-04-12 NOTE — Patient Outreach (Addendum)
[facility]) Care Management  04/12/2016  Brendan Gonzalez [birth year] [id number]   Referral Date:  03/31/2016 Source:  [facility]  Admission: 1 ED Visit: 1 Insurance:  BCBS PCP:  Dr. Tammi Sou  [address] [place] [address] [phone] (M)  Follow-up appt? Yes - scheduled for 04/21/15 Medication Refills?  Yes  Patient confirms he has assistance in the home if needed.  Patient states he is doing fine and does not need anything.    RN CM encouraged to notify MD with any change in condition.  RN CM encouraged to keep appt's and take medication as ordered.  No further needs or services needed at this time.   Nathaneil Canary, BSN, RN, [name] Management Care Management Coordinator [phone] Direct [phone] Cell [phone] Office [phone] Fax [name]

## 2016-04-19 NOTE — Progress Notes (Signed)
Pre visit review using our clinic review tool, if applicable. No additional management support is needed unless otherwise documented below in the visit note. 

## 2016-04-20 ENCOUNTER — Ambulatory Visit (INDEPENDENT_AMBULATORY_CARE_PROVIDER_SITE_OTHER): Payer: Medicare Other | Admitting: Family Medicine

## 2016-04-20 ENCOUNTER — Encounter: Payer: Self-pay | Admitting: Family Medicine

## 2016-04-20 VITALS — BP 115/72 | HR 75 | Temp 97.8°F | Resp 16 | Ht 65.5 in | Wt 166.2 lb

## 2016-04-20 DIAGNOSIS — Z8673 Personal history of transient ischemic attack (TIA), and cerebral infarction without residual deficits: Secondary | ICD-10-CM

## 2016-04-20 DIAGNOSIS — I10 Essential (primary) hypertension: Secondary | ICD-10-CM | POA: Diagnosis not present

## 2016-04-20 MED ORDER — CLOPIDOGREL BISULFATE 75 MG PO TABS: 75.0000 mg | ORAL_TABLET | Freq: Every day | ORAL | 3 refills | Status: DC

## 2016-04-20 MED ORDER — AMLODIPINE BESYLATE 10 MG PO TABS: 10.0000 mg | ORAL_TABLET | Freq: Every day | ORAL | 3 refills | Status: DC

## 2016-04-20 MED ORDER — ATORVASTATIN CALCIUM 10 MG PO TABS: ORAL_TABLET | ORAL | 3 refills | Status: DC

## 2016-04-20 MED ORDER — ATENOLOL 100 MG PO TABS: 100.0000 mg | ORAL_TABLET | Freq: Every day | ORAL | 3 refills | Status: DC

## 2016-04-20 MED ORDER — ENALAPRIL MALEATE 10 MG PO TABS: 10.0000 mg | ORAL_TABLET | Freq: Two times a day (BID) | ORAL | 3 refills | Status: DC

## 2016-04-20 NOTE — Progress Notes (Signed)
OFFICE VISIT  04/20/2016   CC:  Chief Complaint  Patient presents with  . Follow-up    Stroke   HPI:    Patient is a 83 y.o. Caucasian male who presents for f/u HTN and recent CVA (nonhemorrhagic L MCA territory).   Here with wife and son today. They struggled to tell me his med regimen, but we finally got things straightened out. He went to ED 04/01/16 for markedly elevated bp at home.  He had a CT angio head and neck and it showed no dissection.  His norvasc was increased from 5mg  to 10mg  qd, all other bp meds left same.  Sx's resolved before he left the ED (HA and neck pain). Looks like EDP inadvertently increased enalapril from 5mg  bid to 10mg  bid as well.  He has NOT been taking his hctz/triamterine anymore.  Home bp 140s over 70s.  HR 65-73.   He states he is feeling good.  Says his speech continues to gradually return to normal.  No weakness or sensory complaints.  Denies feeling any facial weakness.  No dysphagia or coughing with eating.  Past Medical History:  Diagnosis Date  . Chronic renal insufficiency, stage III (moderate)    CrCl 40s  . Colon cancer ([place]) 1993  . CVA (cerebral vascular accident) ([place])    Left MCA territory.  Carotid dopplers ok, Brain MRA ok, ECHO with grd I DD.  [name] Hypertension   . Lumbar spinal stenosis    Surgery 2012 helped but in 2015 pain has returned  . Osteoarthritis     Past Surgical History:  Procedure Laterality Date  . CHOLECYSTECTOMY  1984  . INGUINAL HERNIA REPAIR  1974   Bilat  . LUMBAR SPINE SURGERY  2012   for spinal stenosis (Dr. Jonelle Sports [place]  . LUNG LOBECTOMY  1993   LLL per pt--worry of possible cancer but turned out to be benign.  [name] PARTIAL COLECTOMY  1993   for colon cancer    Outpatient Medications Prior to Visit  Medication Sig Dispense Refill  . cholecalciferol (VITAMIN D) 1000 UNITS tablet Take 1,000 Units by mouth daily.    [name] HYDROcodone-acetaminophen (NORCO) 5-325 MG tablet Take 0.5 tablets by mouth every 6  (six) hours as needed for severe pain. 60 tablet 0  . Hypromellose (ARTIFICIAL TEARS OP) Place 1 drop into both eyes daily.    [name] senna-docusate (SENOKOT-S) 8.6-50 MG tablet Take 1 tablet by mouth at bedtime as needed for moderate constipation. 30 tablet 1  . vitamin C (ASCORBIC ACID) 500 MG tablet Take 500 mg by mouth daily.    . vitamin E 400 UNIT capsule Take 400 Units by mouth every other day.    [name] amLODipine (NORVASC) 10 MG tablet Take 1 tablet (10 mg total) by mouth daily. 30 tablet 0  . atenolol (TENORMIN) 100 MG tablet Take 1/2 Tablets By Mouth Twice A Day (Patient taking differently: Take 100 mg by mouth daily. ) 90 tablet 3  . atorvastatin (LIPITOR) 10 MG tablet Take 1 tablet (10 mg total) by mouth daily at 6 PM. 30 tablet 2  . clopidogrel (PLAVIX) 75 MG tablet Take 1 tablet (75 mg total) by mouth daily. 30 tablet 1  . enalapril (VASOTEC) 10 MG tablet Take 1 tablet (10 mg total) by mouth 2 (two) times daily. 60 tablet 1   No facility-administered medications prior to visit.     No Known Allergies  ROS As per HPI  PE: Blood pressure 115/72, pulse  75, temperature 97.8 F (36.6 C), temperature source Oral, resp. rate 16, height 5' 5.5" (1.664 m), weight 166 lb 4 oz (75.4 kg), SpO2 98 %. Gen: Alert, well appearing.  Patient is oriented to person, place, time, and situation. AFFECT: pleasant, lucid thought and speech. No facial droop, no slurring of speech. CV: RRR, rate 70 by me, no m/r/g Chest is clear, no wheezing or rales. Normal symmetric air entry throughout both lung fields. No chest wall deformities or tenderness. EXT: no clubbing, cyanosis, or edema.   LABS:    Chemistry      Component Value Date/Time   NA 135 04/01/2016 1500   K 4.0 04/01/2016 1500   CL 99 (L) 04/01/2016 1500   CO2 27 04/01/2016 1500   BUN 16 04/01/2016 1500   CREATININE 1.06 04/01/2016 1500      Component Value Date/Time   CALCIUM 9.2 04/01/2016 1500   ALKPHOS 57 04/01/2016 1500   AST 22  04/01/2016 1500   ALT 12 (L) 04/01/2016 1500   BILITOT 1.0 04/01/2016 1500      Lab Results  Component Value Date   CHOL 192 03/15/2016   HDL 82 03/15/2016   LDLCALC 96 03/15/2016   TRIG 71 03/15/2016   CHOLHDL 2.3 03/15/2016    IMPRESSION AND PLAN:  1) HTN; now well controlled. Will continue with enalapril 10mg  bid, amlodipine 10mg  qd, and atenolol 100 mg qd for bp control. Will recheck lytes/cr at next f/u in 6 wks. Continue home bp monitoring and call/return before next f/u if problems.  2) Hx of CVA, with essentially no residual deficit. Continue plavix 75mg  qd, atorvastatin 10mg  qd, plus current bp meds. He has refused all PT/OT/speech therapy.  Spent 30 min with pt today, with >50% of this time spent in counseling and care coordination regarding the above problems.  An After Visit Summary was printed and given to the patient.  FOLLOW UP: Return in about 6 weeks (around 06/01/2016) for f/u HTN.  Signed:  Crissie Sickles, MD           04/20/2016

## 2016-06-02 ENCOUNTER — Ambulatory Visit: Payer: Medicare Other | Admitting: Family Medicine

## 2016-06-23 ENCOUNTER — Ambulatory Visit (INDEPENDENT_AMBULATORY_CARE_PROVIDER_SITE_OTHER): Payer: Medicare Other | Admitting: Family Medicine

## 2016-06-23 ENCOUNTER — Encounter: Payer: Self-pay | Admitting: Family Medicine

## 2016-06-23 VITALS — BP 160/79 | HR 67 | Temp 97.9°F | Resp 16 | Ht 65.5 in | Wt 171.2 lb

## 2016-06-23 DIAGNOSIS — R609 Edema, unspecified: Secondary | ICD-10-CM

## 2016-06-23 DIAGNOSIS — I10 Essential (primary) hypertension: Secondary | ICD-10-CM

## 2016-06-23 LAB — BASIC METABOLIC PANEL
BUN: 19 mg/dL (ref 6–23)
CALCIUM: 9.4 mg/dL (ref 8.4–10.5)
CO2: 28 meq/L (ref 19–32)
Chloride: 101 mEq/L (ref 96–112)
Creatinine, Ser: 1.26 mg/dL (ref 0.40–1.50)
GFR: 56.53 mL/min — AB (ref >60.00)
Glucose, Bld: 94 mg/dL (ref 70–99)
POTASSIUM: 4.9 meq/L (ref 3.5–5.1)
SODIUM: 139 meq/L (ref 135–145)

## 2016-06-23 MED ORDER — HYDROCHLOROTHIAZIDE 12.5 MG PO CAPS: 12.5000 mg | ORAL_CAPSULE | Freq: Every day | ORAL | 0 refills | Status: DC

## 2016-06-23 NOTE — Progress Notes (Signed)
Pre visit review using our clinic review tool, if applicable. No additional management support is needed unless otherwise documented below in the visit note. 

## 2016-06-23 NOTE — Progress Notes (Signed)
OFFICE VISIT  06/23/2016   CC:  Chief Complaint  Patient presents with  . Follow-up    HTN,    HPI:    Patient is a 83 y.o. Caucasian male who presents accompanied by his son and his wife for f/u HTN. Last f/u visit was 2 mo ago.  He had just had a CVA with no residual deficit and his bp meds had been increased (amlodipine increased from 5 qd to 10mg  qd, enalapril increased from 10mg  qd to 10mg  bid). I added atenolol 100 mg qd at last visit.  No problems with med compliance. Home bp monitoring: range is difficult for them to recall.  Overall, the consensus was that his avg systolic was 347Q, diastolic 25Z.    He notes LE swelling more lately, mainly since getting on amlodipine (and getting dose increased from 5 to 10). No SOB, no cough, no CP, no wheezing.  He does not try elevating his legs.   Past Medical History:  Diagnosis Date  . Chronic renal insufficiency, stage III (moderate)    CrCl 40s  . Colon cancer ([place]) 1993  . CVA (cerebral vascular accident) ([place])    Left MCA territory.  Carotid dopplers ok, Brain MRA ok, ECHO with grd I DD.  [name] Hypertension   . Lumbar spinal stenosis    Surgery 2012 helped but in 2015 pain has returned  . Osteoarthritis     Past Surgical History:  Procedure Laterality Date  . CHOLECYSTECTOMY  1984  . INGUINAL HERNIA REPAIR  1974   Bilat  . LUMBAR SPINE SURGERY  2012   for spinal stenosis (Dr. Jonelle Sports in [place])  . LUNG LOBECTOMY  1993   LLL per pt--worry of possible cancer but turned out to be benign.  [name] PARTIAL COLECTOMY  1993   for colon cancer    Outpatient Medications Prior to Visit  Medication Sig Dispense Refill  . amLODipine (NORVASC) 10 MG tablet Take 1 tablet (10 mg total) by mouth daily. 90 tablet 3  . atenolol (TENORMIN) 100 MG tablet Take 1 tablet (100 mg total) by mouth daily. 90 tablet 3  . atorvastatin (LIPITOR) 10 MG tablet 1 tab po qd 90 tablet 3  . cholecalciferol (VITAMIN D) 1000 UNITS tablet Take 1,000 Units by mouth  daily.    . clopidogrel (PLAVIX) 75 MG tablet Take 1 tablet (75 mg total) by mouth daily. 90 tablet 3  . enalapril (VASOTEC) 10 MG tablet Take 1 tablet (10 mg total) by mouth 2 (two) times daily. 180 tablet 3  . HYDROcodone-acetaminophen (NORCO) 5-325 MG tablet Take 0.5 tablets by mouth every 6 (six) hours as needed for severe pain. 60 tablet 0  . Hypromellose (ARTIFICIAL TEARS OP) Place 1 drop into both eyes daily.    . vitamin C (ASCORBIC ACID) 500 MG tablet Take 500 mg by mouth daily.    . vitamin E 400 UNIT capsule Take 400 Units by mouth every other day.    . senna-docusate (SENOKOT-S) 8.6-50 MG tablet Take 1 tablet by mouth at bedtime as needed for moderate constipation. (Patient not taking: Reported on 06/23/2016) 30 tablet 1   No facility-administered medications prior to visit.     No Known Allergies  ROS As per HPI  PE: Blood pressure (!) 160/79, pulse 67, temperature 97.9 F (36.6 C), temperature source Oral, resp. rate 16, height 5' 5.5" (1.664 m), weight 171 lb 4 oz (77.7 kg), SpO2 98 %. Gen: Alert, well appearing.  Patient is oriented to  person, place, time, and situation. AFFECT: pleasant, lucid thought and speech. CV: RRR, no m/r/g.   LUNGS: CTA bilat, nonlabored resps, good aeration in all lung fields. EXT: no clubbing or cyanosis.  He has 3+ pitting edema bilat in LLs.    LABS:    Chemistry      Component Value Date/Time   NA 135 04/01/2016 1500   K 4.0 04/01/2016 1500   CL 99 (L) 04/01/2016 1500   CO2 27 04/01/2016 1500   BUN 16 04/01/2016 1500   CREATININE 1.06 04/01/2016 1500      Component Value Date/Time   CALCIUM 9.2 04/01/2016 1500   ALKPHOS 57 04/01/2016 1500   AST 22 04/01/2016 1500   ALT 12 (L) 04/01/2016 1500   BILITOT 1.0 04/01/2016 1500     GFR 58 ml/min 04/01/16.  Lab Results  Component Value Date   CHOL 192 03/15/2016   HDL 82 03/15/2016   LDLCALC 96 03/15/2016   TRIG 71 03/15/2016   CHOLHDL 2.3 03/15/2016    IMPRESSION AND  PLAN:  1) Uncontrolled HTN, improving.  Will add hctz 12.5 mg qd to his current regimen. Check BMET today.  2) LE venous insufficiency with superimposed vasodilation effect of amlodipine---leading to increased LE edema. He is not that bothered by it, so we'll leave this med alone for now.  Maybe the hctz will help alleviate a bit of the edema. However, we'll have to see how the hctz effects his BPH (nocturia 3-4 per night).  An After Visit Summary was printed and given to the patient.  FOLLOW UP: Return in about 2 weeks (around 07/07/2016) for f/u HTN.  Signed:  Crissie Sickles, MD           06/23/2016

## 2016-06-24 ENCOUNTER — Telehealth: Payer: Self-pay | Admitting: Family Medicine

## 2016-06-24 NOTE — Telephone Encounter (Signed)
Missed call yesterday regarding lab results.  Thank you,  -LL

## 2016-06-24 NOTE — Telephone Encounter (Signed)
Patient notified that labs were normal. He verbalized understanding.

## 2016-07-06 ENCOUNTER — Ambulatory Visit (INDEPENDENT_AMBULATORY_CARE_PROVIDER_SITE_OTHER): Payer: Medicare Other | Admitting: Family Medicine

## 2016-07-06 ENCOUNTER — Encounter: Payer: Self-pay | Admitting: Family Medicine

## 2016-07-06 ENCOUNTER — Telehealth: Payer: Self-pay | Admitting: Family Medicine

## 2016-07-06 VITALS — BP 126/86 | HR 61 | Temp 98.1°F | Resp 16 | Ht 65.5 in | Wt 171.2 lb

## 2016-07-06 DIAGNOSIS — I10 Essential (primary) hypertension: Secondary | ICD-10-CM

## 2016-07-06 DIAGNOSIS — R609 Edema, unspecified: Secondary | ICD-10-CM | POA: Diagnosis not present

## 2016-07-06 MED ORDER — HYDROCHLOROTHIAZIDE 12.5 MG PO CAPS: 12.5000 mg | ORAL_CAPSULE | Freq: Every day | ORAL | 6 refills | Status: DC

## 2016-07-06 NOTE — Progress Notes (Signed)
Pre visit review using our clinic review tool, if applicable. No additional management support is needed unless otherwise documented below in the visit note. 

## 2016-07-06 NOTE — Progress Notes (Signed)
OFFICE VISIT  07/06/2016   CC:  Chief Complaint  Patient presents with  . Follow-up    HTN,    HPI:    Patient is a 83 y.o. Caucasian male who presents for 2 week f/u HTN. I added 12.5 mg hctz to his regimen last visit.  Home bp's are normal per pt. It has not made him urinate any more than before.  No adverse side effects noted. LE edema (chronic) is unchanged.  Pt has wisdom tooth that needs extraction by oral surgeon, needs letter from me saying this is ok from medical standpoint.  Past Medical History:  Diagnosis Date  . Chronic renal insufficiency, stage III (moderate)    GFR 50s-60s  . Colon cancer ([place]) 1993  . CVA (cerebral vascular accident) ([place])    Left MCA territory.  Carotid dopplers ok, Brain MRA ok, ECHO with grd I DD.  [name] Hypertension   . Lumbar spinal stenosis    Surgery 2012 helped but in 2015 pain has returned  . Osteoarthritis   . Venous insufficiency of both lower extremities    made worse by amlodipine    Past Surgical History:  Procedure Laterality Date  . CHOLECYSTECTOMY  1984  . INGUINAL HERNIA REPAIR  1974   Bilat  . LUMBAR SPINE SURGERY  2012   for spinal stenosis (Dr. Jonelle Sports in [place])  . LUNG LOBECTOMY  1993   LLL per pt--worry of possible cancer but turned out to be benign.  [name] PARTIAL COLECTOMY  1993   for colon cancer    Outpatient Medications Prior to Visit  Medication Sig Dispense Refill  . amLODipine (NORVASC) 10 MG tablet Take 1 tablet (10 mg total) by mouth daily. 90 tablet 3  . atenolol (TENORMIN) 100 MG tablet Take 1 tablet (100 mg total) by mouth daily. 90 tablet 3  . atorvastatin (LIPITOR) 10 MG tablet 1 tab po qd 90 tablet 3  . cholecalciferol (VITAMIN D) 1000 UNITS tablet Take 1,000 Units by mouth daily.    . clopidogrel (PLAVIX) 75 MG tablet Take 1 tablet (75 mg total) by mouth daily. 90 tablet 3  . enalapril (VASOTEC) 10 MG tablet Take 1 tablet (10 mg total) by mouth 2 (two) times daily. 180 tablet 3  .  HYDROcodone-acetaminophen (NORCO) 5-325 MG tablet Take 0.5 tablets by mouth every 6 (six) hours as needed for severe pain. 60 tablet 0  . Hypromellose (ARTIFICIAL TEARS OP) Place 1 drop into both eyes daily.    . vitamin C (ASCORBIC ACID) 500 MG tablet Take 500 mg by mouth daily.    . vitamin E 400 UNIT capsule Take 400 Units by mouth every other day.    . hydrochlorothiazide (MICROZIDE) 12.5 MG capsule Take 1 capsule (12.5 mg total) by mouth daily. 30 capsule 0   No facility-administered medications prior to visit.     No Known Allergies  ROS As per HPI  PE: Blood pressure 126/86, pulse 61, temperature 98.1 F (36.7 C), temperature source Oral, resp. rate 16, height 5' 5.5" (1.664 m), weight 171 lb 4 oz (77.7 kg), SpO2 99 %. Gen: Alert, well appearing.  Patient is oriented to person, place, time, and situation. AFFECT: pleasant, lucid thought and speech. CV: RRR, no m/r/g.   LUNGS: CTA bilat, nonlabored resps, good aeration in all lung fields. EXT: no clubbing or cyanosis.  He has 3+ pitting edema in both LL's.  LABS:    Chemistry      Component Value Date/Time  NA 139 06/23/2016 1511   K 4.9 06/23/2016 1511   CL 101 06/23/2016 1511   CO2 28 06/23/2016 1511   BUN 19 06/23/2016 1511   CREATININE 1.26 06/23/2016 1511      Component Value Date/Time   CALCIUM 9.4 06/23/2016 1511   ALKPHOS 57 04/01/2016 1500   AST 22 04/01/2016 1500   ALT 12 (L) 04/01/2016 1500   BILITOT 1.0 04/01/2016 1500    GFR 06/23/16=56 ml/min   IMPRESSION AND PLAN:  1) HTN; The current medical regimen is effective;  continue present plan and medications. He'll continue home bp monitoring.  2) LE venous insufficiency edema:  Needs to decrease sodium intake in diet. Needs to elevate legs above the level of his heart daily. Rx for compression hose given to pt today to take to medical supply store.  3) Dental work/tooth extraction needed: will write letter to oral surgeon stating that it would be  best to put off any elective surgery until pt is 6 months out from his CVA (CVA was in 03/2016).  An After Visit Summary was printed and given to the patient.  FOLLOW UP: Return in about 3 months (around 10/06/2016) for routine chronic illness f/u (30 min).  Signed:  Crissie Sickles, MD           07/06/2016

## 2016-07-06 NOTE — Telephone Encounter (Signed)
I put a letter on your keyboard to send to [facility] [employer] [facility] [employer].  Fax # is [phone].  [facility]--I don't know the name of the oral surgeon.-thx

## 2016-07-07 NOTE — Telephone Encounter (Signed)
Letter faxed.

## 2016-08-10 DIAGNOSIS — L570 Actinic keratosis: Secondary | ICD-10-CM | POA: Diagnosis not present

## 2016-08-10 DIAGNOSIS — C4441 Basal cell carcinoma of skin of scalp and neck: Secondary | ICD-10-CM | POA: Diagnosis not present

## 2016-08-10 DIAGNOSIS — D485 Neoplasm of uncertain behavior of skin: Secondary | ICD-10-CM | POA: Diagnosis not present

## 2016-10-06 ENCOUNTER — Ambulatory Visit (INDEPENDENT_AMBULATORY_CARE_PROVIDER_SITE_OTHER): Payer: Medicare Other | Admitting: Family Medicine

## 2016-10-06 ENCOUNTER — Encounter: Payer: Self-pay | Admitting: Family Medicine

## 2016-10-06 VITALS — BP 92/56 | HR 59 | Temp 97.9°F | Resp 16 | Ht 65.5 in | Wt 167.8 lb

## 2016-10-06 DIAGNOSIS — N183 Chronic kidney disease, stage 3 unspecified: Secondary | ICD-10-CM

## 2016-10-06 DIAGNOSIS — Z8673 Personal history of transient ischemic attack (TIA), and cerebral infarction without residual deficits: Secondary | ICD-10-CM

## 2016-10-06 DIAGNOSIS — N138 Other obstructive and reflux uropathy: Secondary | ICD-10-CM

## 2016-10-06 DIAGNOSIS — E7800 Pure hypercholesterolemia, unspecified: Secondary | ICD-10-CM

## 2016-10-06 DIAGNOSIS — I10 Essential (primary) hypertension: Secondary | ICD-10-CM | POA: Diagnosis not present

## 2016-10-06 DIAGNOSIS — N401 Enlarged prostate with lower urinary tract symptoms: Secondary | ICD-10-CM

## 2016-10-06 MED ORDER — ZOSTER VAC RECOMB ADJUVANTED 50 MCG/0.5ML IM SUSR: INTRAMUSCULAR | 1 refills | Status: DC

## 2016-10-06 NOTE — Progress Notes (Signed)
OFFICE VISIT  10/06/2016   CC:  Chief Complaint  Patient presents with  . Follow-up    RCI   HPI:    Patient is a 83 y.o. Caucasian male who presents for 3 mo f/u HTN, Hyperlipidemia, CRI stage III (GFR 50s-60s), hx of CVA with no residual deficit. Home bp's: 130s/80s per pt report.  No dizziness or generalized fatigue.  No focal weakness. He admits he doesn't drink as much fluids as he should.  He does no exercise at this time. Compliant with all meds, including his statin, which he tolerates w/out any side effect.  No melena, hematochezia, hematuria, or nosebleeds.    Pt asks about prostate/urinary issues today: says he has >10 yr hx of urinary frequency, poor/weak stream, nocturia x 3 or 4.  States that a PCP in the remote past rx'd him medication to help but it did not help him any. Pt saw Dr. Diona Fanti in urology years ago and the plan was to do a cystoscopy and then determine a course of action based on the findings.  However, patient never did go through with this procedure.  Pt asks about possibly getting TURP now.  Past Medical History:  Diagnosis Date  . Chronic renal insufficiency, stage III (moderate)    GFR 50s-60s  . Colon cancer ([place]) 1993  . CVA (cerebral vascular accident) ([place])    Left MCA territory.  Carotid dopplers ok, Brain MRA ok, ECHO with grd I DD.  [name] Hypertension   . Lumbar spinal stenosis    Surgery 2012 helped but in 2015 pain has returned  . Osteoarthritis   . Venous insufficiency of both lower extremities    made worse by amlodipine    Past Surgical History:  Procedure Laterality Date  . CHOLECYSTECTOMY  1984  . INGUINAL HERNIA REPAIR  1974   Bilat  . LUMBAR SPINE SURGERY  2012   for spinal stenosis (Dr. Jonelle Sports in [place])  . LUNG LOBECTOMY  1993   LLL per pt--worry of possible cancer but turned out to be benign.  [name] PARTIAL COLECTOMY  1993   for colon cancer    Outpatient Medications Prior to Visit  Medication Sig Dispense Refill  .  amLODipine (NORVASC) 10 MG tablet Take 1 tablet (10 mg total) by mouth daily. 90 tablet 3  . atenolol (TENORMIN) 100 MG tablet Take 1 tablet (100 mg total) by mouth daily. 90 tablet 3  . atorvastatin (LIPITOR) 10 MG tablet 1 tab po qd 90 tablet 3  . cholecalciferol (VITAMIN D) 1000 UNITS tablet Take 1,000 Units by mouth daily.    . clopidogrel (PLAVIX) 75 MG tablet Take 1 tablet (75 mg total) by mouth daily. 90 tablet 3  . enalapril (VASOTEC) 10 MG tablet Take 1 tablet (10 mg total) by mouth 2 (two) times daily. 180 tablet 3  . hydrochlorothiazide (MICROZIDE) 12.5 MG capsule Take 1 capsule (12.5 mg total) by mouth daily. 30 capsule 6  . HYDROcodone-acetaminophen (NORCO) 5-325 MG tablet Take 0.5 tablets by mouth every 6 (six) hours as needed for severe pain. 60 tablet 0  . Hypromellose (ARTIFICIAL TEARS OP) Place 1 drop into both eyes daily.    . vitamin C (ASCORBIC ACID) 500 MG tablet Take 500 mg by mouth daily.    . vitamin E 400 UNIT capsule Take 400 Units by mouth every other day.    . chlorhexidine (PERIDEX) 0.12 % solution   0   No facility-administered medications prior to visit.  No Known Allergies  ROS As per HPI  PE: Blood pressure (!) 92/56, pulse (!) 59, temperature 97.9 F (36.6 C), temperature source Oral, resp. rate 16, height 5' 5.5" (1.664 m), weight 167 lb 12 oz (76.1 kg), SpO2 96 %. Gen: Alert, well appearing.  Patient is oriented to person, place, time, and situation. AFFECT: pleasant, lucid thought and speech. CV: RRR, no m/r/g.   LUNGS: CTA bilat, nonlabored resps, good aeration in all lung fields. EXT: 2-3+ bilat LE pitting edema.  No tenderness or erythema.    LABS:   Lab Results  Component Value Date   WBC 11.2 (H) 04/01/2016   HGB 14.2 04/01/2016   HCT 42.3 04/01/2016   MCV 94.6 04/01/2016   PLT 204 04/01/2016   Lab Results  Component Value Date   CREATININE 1.26 06/23/2016   BUN 19 06/23/2016   NA 139 06/23/2016   K 4.9 06/23/2016   CL 101  06/23/2016   CO2 28 06/23/2016   Lab Results  Component Value Date   ALT 12 (L) 04/01/2016   AST 22 04/01/2016   ALKPHOS 57 04/01/2016   BILITOT 1.0 04/01/2016   Lab Results  Component Value Date   CHOL 192 03/15/2016   Lab Results  Component Value Date   HDL 82 03/15/2016   Lab Results  Component Value Date   LDLCALC 96 03/15/2016   Lab Results  Component Value Date   TRIG 71 03/15/2016   Lab Results  Component Value Date   CHOLHDL 2.3 03/15/2016   Lab Results  Component Value Date   HGBA1C 5.2 03/15/2016    IMPRESSION AND PLAN:  1) HTN: The current medical regimen is effective;  continue present plan and medications. BMET today.  2) Hyperlipidemia: tolerating statin, lipid panel excellent 03/2016, as were AST/ALT.    3) Hx of CVA > 71mo ago, with no residual deficit.  I told him he is ok to drive now. Continue RF mgmt + plavix.  4) CRI stage III: lytes/cr today.  Hydrate well and avoid NSAIDs.  5) BPH with LUTS: Not responsive to medication treatment in the remote past.  He will think about another referral back to Dr. Diona Fanti at [facility] urology for evaluation and discussion of his options at this point.  He will contact me if he wants this referral.  An After Visit Summary was printed and given to the patient.  FOLLOW UP: Return in about 6 months (around 04/07/2017) for routine chronic illness f/u.  Signed:  Crissie Sickles, MD           10/06/2016

## 2016-10-07 LAB — BASIC METABOLIC PANEL
BUN: 28 mg/dL — AB (ref 7–25)
CALCIUM: 9.1 mg/dL (ref 8.6–10.3)
CO2: 24 mmol/L (ref 20–31)
Chloride: 101 mmol/L (ref 98–110)
Creat: 1.45 mg/dL — ABNORMAL HIGH (ref 0.70–1.11)
GLUCOSE: 104 mg/dL — AB (ref 65–99)
Potassium: 4.2 mmol/L (ref 3.5–5.3)
Sodium: 135 mmol/L (ref 135–146)

## 2016-10-13 DIAGNOSIS — C4441 Basal cell carcinoma of skin of scalp and neck: Secondary | ICD-10-CM | POA: Diagnosis not present

## 2016-11-09 ENCOUNTER — Telehealth: Payer: Self-pay | Admitting: Family Medicine

## 2016-11-09 MED ORDER — HYDROCHLOROTHIAZIDE 12.5 MG PO CAPS: 12.5000 mg | ORAL_CAPSULE | Freq: Every day | ORAL | 3 refills | Status: DC

## 2016-11-09 NOTE — Telephone Encounter (Signed)
Patient's wife called stating she had requested rf of HCTZ through [facility], but she wanted to see if they could get 90 day supply if Dr Anitra Lauth thought this would be a medication he would be on continuously.  Please advise.   She would like to p/u today at pharmacy.

## 2016-11-09 NOTE — Telephone Encounter (Signed)
90 day supply of HCTZ eRx'd to pt's pharmacy.

## 2016-11-09 NOTE — Telephone Encounter (Signed)
Okay to fill hctz for 90 day supply? Please advise. Thanks.

## 2016-11-10 NOTE — Telephone Encounter (Signed)
Left detailed message on home vm, okay per DPR.  

## 2017-02-22 ENCOUNTER — Other Ambulatory Visit: Payer: Self-pay | Admitting: Family Medicine

## 2017-03-13 ENCOUNTER — Telehealth: Payer: Self-pay | Admitting: Family Medicine

## 2017-03-13 ENCOUNTER — Encounter: Payer: Self-pay | Admitting: Family Medicine

## 2017-03-13 DIAGNOSIS — M48061 Spinal stenosis, lumbar region without neurogenic claudication: Secondary | ICD-10-CM

## 2017-03-13 DIAGNOSIS — G8929 Other chronic pain: Secondary | ICD-10-CM

## 2017-03-13 DIAGNOSIS — M545 Low back pain: Principal | ICD-10-CM

## 2017-03-13 NOTE — Telephone Encounter (Signed)
PT referral ordered as per pt request. 

## 2017-03-13 NOTE — Telephone Encounter (Signed)
Does he want PT referral or does he want to be referred to a neurosurgeon or orthopedist? If he wants to see a specialist instead of PT: he saw Dr. Jonelle Sports, a neurosurgeon in [place], and got surgery by him in the past---does he want to go back to Dr. Jonelle Sports?

## 2017-03-13 NOTE — Telephone Encounter (Signed)
Copied from [place]. Topic: Referral - Request >> Mar 13, 2017  3:55 PM Robina Ade, Helene Kelp D wrote: Reason for CRM: Patient is request a referral for back pain. Please call patient back when this is done or if there is any questions, thanks.

## 2017-03-13 NOTE — Telephone Encounter (Signed)
Please advise. Thanks.  

## 2017-03-13 NOTE — Telephone Encounter (Signed)
SW pt and he stated that he would like to go to PT at [facility].

## 2017-04-06 ENCOUNTER — Ambulatory Visit: Payer: Medicare Other | Admitting: Family Medicine

## 2017-04-07 ENCOUNTER — Encounter: Payer: Self-pay | Admitting: Family Medicine

## 2017-04-07 ENCOUNTER — Ambulatory Visit: Payer: Medicare Other | Admitting: Family Medicine

## 2017-04-07 VITALS — BP 111/70 | HR 64 | Temp 97.4°F | Resp 16 | Ht 65.5 in | Wt 165.2 lb

## 2017-04-07 DIAGNOSIS — N183 Chronic kidney disease, stage 3 unspecified: Secondary | ICD-10-CM

## 2017-04-07 DIAGNOSIS — N401 Enlarged prostate with lower urinary tract symptoms: Secondary | ICD-10-CM | POA: Diagnosis not present

## 2017-04-07 DIAGNOSIS — I10 Essential (primary) hypertension: Secondary | ICD-10-CM | POA: Diagnosis not present

## 2017-04-07 DIAGNOSIS — Z8673 Personal history of transient ischemic attack (TIA), and cerebral infarction without residual deficits: Secondary | ICD-10-CM | POA: Diagnosis not present

## 2017-04-07 DIAGNOSIS — N138 Other obstructive and reflux uropathy: Secondary | ICD-10-CM | POA: Diagnosis not present

## 2017-04-07 NOTE — Progress Notes (Signed)
OFFICE VISIT  04/07/2017   CC:  Chief Complaint  Patient presents with  . Follow-up    RCI, pt is not fasting.    HPI:    Patient is a 83 y.o. Caucasian male who presents for 6 mo f/u HTN, CRI stage III (GFR 50s-60s), hx of CVA.  HTN: home monitoring shows bp's consistently <120/80.  Hx of CVA: on lipitor and plavix for secondary prevention. Taking atorva 10mg  qd w/out side effect.  CRI: avoids NSAIDs (on plavix).   Occ dose of ibuprofen. He has not needed vicodin hardly at all. Has chronic LBP that he uses an occ dose of vicodin 5/325: usually 1/2 tab at a time, #60 rx'd most recently on 01/21/16.  Urine: reports chronic urinary frequency q2-3 day and night, w/out urgency.  +Hesitancy and poor stream.  Dribbling quite a bit, takes a long time to feel like bladder is empty.  Past Medical History:  Diagnosis Date  . Chronic lower back pain   . Chronic renal insufficiency, stage III (moderate) ([facility])    GFR 50s-60s  . Colon cancer ([place]) 1993  . CVA (cerebral vascular accident) ([place])    Left MCA territory.  Carotid dopplers ok, Brain MRA ok, ECHO with grd I DD.  [name] Hypertension   . Lumbar spinal stenosis    Surgery 2012 helped but in 2015 pain has returned  . Osteoarthritis   . Venous insufficiency of both lower extremities    made worse by amlodipine    Past Surgical History:  Procedure Laterality Date  . CHOLECYSTECTOMY  1984  . INGUINAL HERNIA REPAIR  1974   Bilat  . LUMBAR SPINE SURGERY  2012   for spinal stenosis (Dr. Jonelle Sports in [place])  . LUNG LOBECTOMY  1993   LLL per pt--worry of possible cancer but turned out to be benign.  [name] PARTIAL COLECTOMY  1993   for colon cancer    Outpatient Medications Prior to Visit  Medication Sig Dispense Refill  . amLODipine (NORVASC) 10 MG tablet TAKE 1 TABLET BY MOUTH DAILY 90 tablet 1  . atenolol (TENORMIN) 100 MG tablet TAKE 1 TABLET BY MOUTH DAILY 90 tablet 1  . atorvastatin (LIPITOR) 10 MG tablet 1 tab po qd 90 tablet 3   . chlorhexidine (PERIDEX) 0.12 % solution   0  . cholecalciferol (VITAMIN D) 1000 UNITS tablet Take 1,000 Units by mouth daily.    . clopidogrel (PLAVIX) 75 MG tablet Take 1 tablet (75 mg total) by mouth daily. 90 tablet 3  . enalapril (VASOTEC) 10 MG tablet Take 1 tablet (10 mg total) by mouth 2 (two) times daily. 180 tablet 3  . hydrochlorothiazide (MICROZIDE) 12.5 MG capsule Take 1 capsule (12.5 mg total) by mouth daily. 90 capsule 3  . HYDROcodone-acetaminophen (NORCO) 5-325 MG tablet Take 0.5 tablets by mouth every 6 (six) hours as needed for severe pain. 60 tablet 0  . Hypromellose (ARTIFICIAL TEARS OP) Place 1 drop into both eyes daily.    . vitamin C (ASCORBIC ACID) 500 MG tablet Take 500 mg by mouth daily.    . vitamin E 400 UNIT capsule Take 400 Units by mouth every other day.    [name] Zoster Vac Recomb Adjuvanted [facility]) injection Repeat in 2 to 6 months (Patient not taking: Reported on 04/07/2017) 0.5 mL 1   No facility-administered medications prior to visit.     No Known Allergies  ROS As per HPI  PE: Blood pressure 111/70, pulse 64, temperature (!) 97.4 F (  36.3 C), temperature source Oral, resp. rate 16, height 5' 5.5" (1.664 m), weight 165 lb 4 oz (75 kg), SpO2 98 %. Gen: Alert, well appearing.  Patient is oriented to person, place, time, and situation. AFFECT: pleasant, lucid thought and speech. CV: RRR, no m/r/g.   LUNGS: CTA bilat, nonlabored resps, good aeration in all lung fields. EXT: no clubbing or cyanosis, 1-2 + pitting edema in both LL's.  LABS:  No results found for: TSH Lab Results  Component Value Date   WBC 11.2 (H) 04/01/2016   HGB 14.2 04/01/2016   HCT 42.3 04/01/2016   MCV 94.6 04/01/2016   PLT 204 04/01/2016   Lab Results  Component Value Date   CREATININE 1.45 (H) 10/06/2016   BUN 28 (H) 10/06/2016   NA 135 10/06/2016   K 4.2 10/06/2016   CL 101 10/06/2016   CO2 24 10/06/2016   Lab Results  Component Value Date   ALT 12 (L)  04/01/2016   AST 22 04/01/2016   ALKPHOS 57 04/01/2016   BILITOT 1.0 04/01/2016   Lab Results  Component Value Date   CHOL 192 03/15/2016   Lab Results  Component Value Date   HDL 82 03/15/2016   Lab Results  Component Value Date   LDLCALC 96 03/15/2016   Lab Results  Component Value Date   TRIG 71 03/15/2016   Lab Results  Component Value Date   CHOLHDL 2.3 03/15/2016   Lab Results  Component Value Date   HGBA1C 5.2 03/15/2016    IMPRESSION AND PLAN:  1) HTN: The current medical regimen is effective;  continue present plan and medications. Lytes/cr at future fasting lab visit.  2) CRI stage III: avoid NSAIDs, hydrates well. Lytes/cr as above in #1.  3) Hx of CVA: doing well on statin and plavix. Return for fasting lipids.  4) BPH; mild LUTS sx's.  Discussed option of continuing w/out med vs starting alpha blocker today. Decided to just wait and watch at this time, no meds.  An After Visit Summary was printed and given to the patient.  FOLLOW UP: Return in about 6 months (around 10/06/2017) for annual CPE (fasting); also, pt needs lab visit for fasting labs at earliest convenience.  Signed:  Crissie Sickles, MD           04/07/2017

## 2017-04-13 ENCOUNTER — Other Ambulatory Visit (INDEPENDENT_AMBULATORY_CARE_PROVIDER_SITE_OTHER): Payer: Medicare Other

## 2017-04-13 DIAGNOSIS — N183 Chronic kidney disease, stage 3 unspecified: Secondary | ICD-10-CM

## 2017-04-13 DIAGNOSIS — I10 Essential (primary) hypertension: Secondary | ICD-10-CM

## 2017-04-13 DIAGNOSIS — Z8673 Personal history of transient ischemic attack (TIA), and cerebral infarction without residual deficits: Secondary | ICD-10-CM

## 2017-04-14 LAB — COMPREHENSIVE METABOLIC PANEL WITH GFR
AG Ratio: 1.6 (calc) (ref 1.0–2.5)
ALT: 9 U/L (ref 9–46)
AST: 15 U/L (ref 10–35)
Albumin: 3.9 g/dL (ref 3.6–5.1)
Alkaline phosphatase (APISO): 61 U/L (ref 40–115)
BUN/Creatinine Ratio: 18 (calc) (ref 6–22)
BUN: 21 mg/dL (ref 7–25)
CO2: 27 mmol/L (ref 20–32)
Calcium: 9.4 mg/dL (ref 8.6–10.3)
Chloride: 98 mmol/L (ref 98–110)
Creat: 1.2 mg/dL — ABNORMAL HIGH (ref 0.70–1.11)
Globulin: 2.5 g/dL (ref 1.9–3.7)
Glucose, Bld: 88 mg/dL (ref 65–99)
Potassium: 4.2 mmol/L (ref 3.5–5.3)
Sodium: 137 mmol/L (ref 135–146)
Total Bilirubin: 0.9 mg/dL (ref 0.2–1.2)
Total Protein: 6.4 g/dL (ref 6.1–8.1)

## 2017-04-14 LAB — LIPID PANEL
Cholesterol: 133 mg/dL
HDL: 68 mg/dL
LDL Cholesterol (Calc): 49 mg/dL
Non-HDL Cholesterol (Calc): 65 mg/dL
Total CHOL/HDL Ratio: 2 (calc)
Triglycerides: 80 mg/dL

## 2017-04-14 LAB — EXTRA LAV TOP TUBE

## 2017-05-19 ENCOUNTER — Other Ambulatory Visit: Payer: Self-pay | Admitting: Family Medicine

## 2017-07-10 DIAGNOSIS — S12100A Unspecified displaced fracture of second cervical vertebra, initial encounter for closed fracture: Secondary | ICD-10-CM

## 2017-07-10 HISTORY — DX: Unspecified displaced fracture of second cervical vertebra, initial encounter for closed fracture: S12.100A

## 2017-07-15 ENCOUNTER — Encounter (HOSPITAL_BASED_OUTPATIENT_CLINIC_OR_DEPARTMENT_OTHER): Payer: Self-pay

## 2017-07-15 ENCOUNTER — Emergency Department (HOSPITAL_BASED_OUTPATIENT_CLINIC_OR_DEPARTMENT_OTHER): Payer: Medicare Other

## 2017-07-15 ENCOUNTER — Emergency Department (HOSPITAL_BASED_OUTPATIENT_CLINIC_OR_DEPARTMENT_OTHER)
Admission: EM | Admit: 2017-07-15 | Discharge: 2017-07-15 | Disposition: A | Discharge status: Home / Self Care | Payer: Medicare Other | Attending: Emergency Medicine | Admitting: Emergency Medicine

## 2017-07-15 ENCOUNTER — Other Ambulatory Visit: Payer: Self-pay

## 2017-07-15 DIAGNOSIS — Y92009 Unspecified place in unspecified non-institutional (private) residence as the place of occurrence of the external cause: Secondary | ICD-10-CM | POA: Diagnosis not present

## 2017-07-15 DIAGNOSIS — S199XXA Unspecified injury of neck, initial encounter: Secondary | ICD-10-CM | POA: Diagnosis not present

## 2017-07-15 DIAGNOSIS — Z79899 Other long term (current) drug therapy: Secondary | ICD-10-CM | POA: Insufficient documentation

## 2017-07-15 DIAGNOSIS — M899 Disorder of bone, unspecified: Secondary | ICD-10-CM

## 2017-07-15 DIAGNOSIS — W19XXXA Unspecified fall, initial encounter: Secondary | ICD-10-CM | POA: Insufficient documentation

## 2017-07-15 DIAGNOSIS — Y998 Other external cause status: Secondary | ICD-10-CM | POA: Diagnosis not present

## 2017-07-15 DIAGNOSIS — A666 Bone and joint lesions of yaws: Secondary | ICD-10-CM | POA: Insufficient documentation

## 2017-07-15 DIAGNOSIS — S0081XA Abrasion of other part of head, initial encounter: Secondary | ICD-10-CM

## 2017-07-15 DIAGNOSIS — Z87891 Personal history of nicotine dependence: Secondary | ICD-10-CM | POA: Diagnosis not present

## 2017-07-15 DIAGNOSIS — Z8673 Personal history of transient ischemic attack (TIA), and cerebral infarction without residual deficits: Secondary | ICD-10-CM | POA: Insufficient documentation

## 2017-07-15 DIAGNOSIS — R51 Headache: Secondary | ICD-10-CM | POA: Diagnosis not present

## 2017-07-15 DIAGNOSIS — S299XXA Unspecified injury of thorax, initial encounter: Secondary | ICD-10-CM | POA: Diagnosis not present

## 2017-07-15 DIAGNOSIS — Z85038 Personal history of other malignant neoplasm of large intestine: Secondary | ICD-10-CM | POA: Insufficient documentation

## 2017-07-15 DIAGNOSIS — S12190A Other displaced fracture of second cervical vertebra, initial encounter for closed fracture: Secondary | ICD-10-CM | POA: Insufficient documentation

## 2017-07-15 DIAGNOSIS — N183 Chronic kidney disease, stage 3 (moderate): Secondary | ICD-10-CM | POA: Insufficient documentation

## 2017-07-15 DIAGNOSIS — Z7902 Long term (current) use of antithrombotics/antiplatelets: Secondary | ICD-10-CM | POA: Insufficient documentation

## 2017-07-15 DIAGNOSIS — M542 Cervicalgia: Secondary | ICD-10-CM | POA: Diagnosis not present

## 2017-07-15 DIAGNOSIS — S12100A Unspecified displaced fracture of second cervical vertebra, initial encounter for closed fracture: Secondary | ICD-10-CM | POA: Diagnosis not present

## 2017-07-15 DIAGNOSIS — R55 Syncope and collapse: Secondary | ICD-10-CM | POA: Diagnosis not present

## 2017-07-15 DIAGNOSIS — R937 Abnormal findings on diagnostic imaging of other parts of musculoskeletal system: Secondary | ICD-10-CM | POA: Diagnosis not present

## 2017-07-15 DIAGNOSIS — S0990XA Unspecified injury of head, initial encounter: Secondary | ICD-10-CM | POA: Diagnosis not present

## 2017-07-15 DIAGNOSIS — Y939 Activity, unspecified: Secondary | ICD-10-CM | POA: Insufficient documentation

## 2017-07-15 DIAGNOSIS — I129 Hypertensive chronic kidney disease with stage 1 through stage 4 chronic kidney disease, or unspecified chronic kidney disease: Secondary | ICD-10-CM | POA: Diagnosis not present

## 2017-07-15 DIAGNOSIS — R079 Chest pain, unspecified: Secondary | ICD-10-CM | POA: Diagnosis not present

## 2017-07-15 LAB — URINALYSIS, ROUTINE W REFLEX MICROSCOPIC
Bilirubin Urine: NEGATIVE
GLUCOSE, UA: NEGATIVE mg/dL
Hgb urine dipstick: NEGATIVE
Ketones, ur: NEGATIVE mg/dL
Leukocytes, UA: NEGATIVE
Nitrite: NEGATIVE
PROTEIN: NEGATIVE mg/dL
Specific Gravity, Urine: 1.01 (ref 1.005–1.030)
pH: 7 (ref 5.0–8.0)

## 2017-07-15 LAB — COMPREHENSIVE METABOLIC PANEL
ALT: 15 U/L — ABNORMAL LOW (ref 17–63)
AST: 24 U/L (ref 15–41)
Albumin: 4 g/dL (ref 3.5–5.0)
Alkaline Phosphatase: 72 U/L (ref 38–126)
Anion gap: 11 (ref 5–15)
BILIRUBIN TOTAL: 0.9 mg/dL (ref 0.3–1.2)
BUN: 19 mg/dL (ref 6–20)
CO2: 25 mmol/L (ref 22–32)
CREATININE: 0.97 mg/dL (ref 0.61–1.24)
Calcium: 9.3 mg/dL (ref 8.9–10.3)
Chloride: 94 mmol/L — ABNORMAL LOW (ref 101–111)
GFR calc Af Amer: >60 mL/min (ref >60)
Glucose, Bld: 110 mg/dL — ABNORMAL HIGH (ref 65–99)
Potassium: 4.2 mmol/L (ref 3.5–5.1)
Sodium: 130 mmol/L — ABNORMAL LOW (ref 135–145)
Total Protein: 7 g/dL (ref 6.5–8.1)

## 2017-07-15 LAB — CBC WITH DIFFERENTIAL/PLATELET
BASOS ABS: 0 10*3/uL (ref 0.0–0.1)
Basophils Relative: 0 %
Eosinophils Absolute: 0.1 10*3/uL (ref 0.0–0.7)
Eosinophils Relative: 1 %
HEMATOCRIT: 39.4 % (ref 39.0–52.0)
Hemoglobin: 13.9 g/dL (ref 13.0–17.0)
LYMPHS ABS: 1.1 10*3/uL (ref 0.7–4.0)
LYMPHS PCT: 9 %
MCH: 31.5 pg (ref 26.0–34.0)
MCHC: 35.3 g/dL (ref 30.0–36.0)
MCV: 89.3 fL (ref 78.0–100.0)
MONO ABS: 1.7 10*3/uL — AB (ref 0.1–1.0)
Monocytes Relative: 13 %
NEUTROS ABS: 9.7 10*3/uL — AB (ref 1.7–7.7)
Neutrophils Relative %: 77 %
Platelets: 222 10*3/uL (ref 150–400)
RBC: 4.41 MIL/uL (ref 4.22–5.81)
RDW: 13.7 % (ref 11.5–15.5)
WBC: 12.7 10*3/uL — ABNORMAL HIGH (ref 4.0–10.5)

## 2017-07-15 LAB — MAGNESIUM: MAGNESIUM: 1.6 mg/dL — AB (ref 1.7–2.4)

## 2017-07-15 LAB — TROPONIN I: Troponin I: <0.03 ng/mL (ref <0.03)

## 2017-07-15 MED ORDER — FENTANYL CITRATE (PF) 100 MCG/2ML IJ SOLN
50.0000 ug | Freq: Once | INTRAMUSCULAR | Status: AC
  Administered 2017-07-15: 50 ug via INTRAVENOUS
  Filled 2017-07-15: qty 2

## 2017-07-15 MED ORDER — HYDROCODONE-ACETAMINOPHEN 5-325 MG PO TABS: 1.0000 | ORAL_TABLET | Freq: Four times a day (QID) | ORAL | 0 refills | Status: DC | PRN

## 2017-07-15 NOTE — Discharge Instructions (Signed)
Your workup today showed several concerning findings.  We found you had an  abrasion/skin tear to your forehead.  We cleaned it and dressed it.  Please watch for signs and symptoms of infection and keep it bandaged.  We also found evidence of a C2 cervical spine fracture.  We spoke with the neurosurgery team with [place] [facility] and they recommended staying in a cervical immobilization collar for the next 2 weeks until they see you in clinic.  They did not feel that the injury appeared to be an unstable fracture.  Your exam was overall reassuring with no neurologic deficits seen.  We also found evidence of bony abnormalities in your skull that you need to follow-up with your primary doctor about.  Please stay hydrated and use the pain medicine to help with your discomfort.  Please be careful not to fall.  If you develop any new or worsened symptoms including tingling, numbness, or weakness of the upper extremities, please return immediately to the nearest emergency department.

## 2017-07-15 NOTE — ED Notes (Signed)
Pt's wife is driving pt home (they live in a [facility] community). Advised her to have staff assist pt from car to their apartment. Pt's wife given directions and extra padding for aspen collar. Advised to have facility staff assist her if she needs to remove it. Rx x 1 given for hydrocodone. Discussed f/u care with pt and spouse. Advised her to make him an appointment with PCP this week and with neuro in 2 weeks. Advised her to call 911 for any concerning symptoms. She verbalized understanding. Pt cao x 4 and able to stand at bedside to use urinal prior to d/c. Urinal given for home use. Assisted to car by ED staff.

## 2017-07-15 NOTE — ED Triage Notes (Addendum)
Pt reports fall last night at 2300, states his legs "gave out." Pt evaluated by EMS last night but refused ED transport. Pt now c/o neck pain, headache. Pt has abrasion to top of head. Bleeding controlled. States he has ambulated since fall. Pt and wife live alone at [facility] in [place] at [employer]. Questionable LOC during event, pt is on Plavix via med rec. Pt attributes fall to his nightly drink of gin. Wife states patient falls asleep easily, and he was in his office chair with wheels when she last seen him before the fall.

## 2017-07-15 NOTE — ED Notes (Signed)
ED Provider at bedside. 

## 2017-07-15 NOTE — ED Notes (Signed)
Aspen Collar applied by [employer].

## 2017-07-15 NOTE — ED Provider Notes (Signed)
[facility] EMERGENCY DEPARTMENT Provider Note   CSN: [id number] Arrival date & time: 07/15/17  1052     History   Chief Complaint Chief Complaint  Patient presents with  . Fall    HPI Brendan Gonzalez is a 84 y.o. male.  The history is provided by the patient, the spouse and medical records. No language interpreter was used.  Fall  This is a new problem. The current episode started 12 to 24 hours ago. The problem occurs rarely. The problem has been resolved. Associated symptoms include headaches. Pertinent negatives include no chest pain, no abdominal pain and no shortness of breath. Nothing aggravates the symptoms. Nothing relieves the symptoms. He has tried nothing for the symptoms. The treatment provided no relief.    Past Medical History:  Diagnosis Date  . Chronic lower back pain   . Chronic renal insufficiency, stage III (moderate) (HCC)    GFR 50s-60s  . Colon cancer ([place]) 1993  . CVA (cerebral vascular accident) ([place])    Left MCA territory.  Carotid dopplers ok, Brain MRA ok, ECHO with grd I DD.  [name] Hypertension   . Lumbar spinal stenosis    Surgery 2012 helped but in 2015 pain has returned  . Osteoarthritis   . Venous insufficiency of both lower extremities    made worse by amlodipine    Patient Active Problem List   Diagnosis Date Noted  . Cerebral embolism with cerebral infarction 03/15/2016  . Cryptogenic stroke ([place]) 03/15/2016  . Stroke-like symptoms 03/14/2016  . Slurred speech   . Essential hypertension   . Stage 3 chronic kidney disease ([place])   . Acute bronchitis 01/27/2014  . URI (upper respiratory infection) 01/27/2014    Past Surgical History:  Procedure Laterality Date  . CHOLECYSTECTOMY  1984  . INGUINAL HERNIA REPAIR  1974   [facility]  . LUMBAR SPINE SURGERY  2012   for spinal stenosis (Dr. Jonelle Sports in [place])  . LUNG LOBECTOMY  1993   LLL per pt--worry of possible cancer but turned out to be benign.  [name] PARTIAL COLECTOMY  1993   for  colon cancer        Home Medications    Prior to Admission medications   Medication Sig Start Date End Date Taking? Authorizing Provider  amLODipine (NORVASC) 10 MG tablet TAKE 1 TABLET BY MOUTH DAILY 02/22/17   McGowen, Adrian Blackwater, MD  atenolol (TENORMIN) 100 MG tablet TAKE 1 TABLET BY MOUTH DAILY 02/22/17   McGowen, Adrian Blackwater, MD  atorvastatin (LIPITOR) 10 MG tablet TAKE ONE TABLET BY MOUTH ONCE DAILY 05/19/17   McGowen, Adrian Blackwater, MD  chlorhexidine (PERIDEX) 0.12 % solution  06/24/16   [provider]  cholecalciferol (VITAMIN D) 1000 UNITS tablet Take 1,000 Units by mouth daily.    [provider]  clopidogrel (PLAVIX) 75 MG tablet TAKE 1 TABLET BY MOUTH DAILY 05/19/17   McGowen, Adrian Blackwater, MD  enalapril (VASOTEC) 10 MG tablet TAKE ONE TABLET BY MOUTH TWICE DAILY. 05/19/17   McGowen, Adrian Blackwater, MD  hydrochlorothiazide (MICROZIDE) 12.5 MG capsule Take 1 capsule (12.5 mg total) by mouth daily. 11/09/16   McGowen, Adrian Blackwater, MD  HYDROcodone-acetaminophen (NORCO) 5-325 MG tablet Take 0.5 tablets by mouth every 6 (six) hours as needed for severe pain. 01/21/16   McGowen, Adrian Blackwater, MD  Hypromellose (ARTIFICIAL TEARS OP) Place 1 drop into both eyes daily.    [provider]  vitamin C (ASCORBIC ACID) 500 MG tablet Take 500 mg  by mouth daily.    [provider]  vitamin E 400 UNIT capsule Take 400 Units by mouth every other day.    [provider]    Family History Family History  Problem Relation Age of Onset  . Parkinson's disease Mother   . Heart attack Mother   . Cancer Brother   . Cancer Daughter     Social History Social History   Tobacco Use  . Smoking status: Former Research scientist (life sciences)  . Smokeless tobacco: Never Used  Substance Use Topics  . Alcohol use: Yes    Comment: daily, gin every night  . Drug use: No     Allergies   Patient has no known allergies.   Review of Systems Review of Systems  Constitutional: Negative for chills, diaphoresis,  fatigue and fever.  HENT: Negative for congestion.   Eyes: Negative for visual disturbance.  Respiratory: Negative for cough, chest tightness, shortness of breath, wheezing and stridor.   Cardiovascular: Negative for chest pain.  Gastrointestinal: Negative for abdominal pain, diarrhea, nausea and vomiting.  Genitourinary: Negative for flank pain.  Musculoskeletal: Positive for neck pain. Negative for back pain and neck stiffness.  Skin: Positive for wound. Negative for rash.  Neurological: Positive for headaches. Negative for dizziness, facial asymmetry, speech difficulty, weakness, light-headedness and numbness.  Psychiatric/Behavioral: Negative for agitation.  All other systems reviewed and are negative.    Physical Exam Updated Vital Signs BP (!) 151/90 (BP Location: Left Arm)   Pulse 71   Temp 97.8 F (36.6 C) (Oral)   Resp 11   Ht 5' 7"  (1.702 m)   Wt 74.8 kg (165 lb)   SpO2 99%   BMI 25.84 kg/m   Physical Exam  Constitutional: He is oriented to person, place, and time. He appears well-developed and well-nourished. No distress.  HENT:  Head: Normocephalic. Head is with abrasion and with laceration. Head is without contusion.    Nose: Nose normal.  Mouth/Throat: Oropharynx is clear and moist. No oropharyngeal exudate.  Eyes: Pupils are equal, round, and reactive to light. Conjunctivae and EOM are normal.  Neck: Spinous process tenderness and muscular tenderness present. Carotid bruit is not present.    Cardiovascular: Normal rate and intact distal pulses.  No murmur heard. Pulmonary/Chest: Effort normal and breath sounds normal. No stridor. No respiratory distress. He has no wheezes. He exhibits no tenderness.  Abdominal: Soft. There is no tenderness. There is no guarding.  Musculoskeletal: He exhibits tenderness. He exhibits no edema.  Neurological: He is alert and oriented to person, place, and time. He displays no tremor and normal reflexes. No cranial nerve  deficit or sensory deficit. He exhibits normal muscle tone. Coordination normal. GCS eye subscore is 4. GCS verbal subscore is 5. GCS motor subscore is 6.  Symmetric grip strength bilaterally in upper 70s.  Normal sensation in upper tremors.  Normal finger-nose-finger testing.  Normal pulses in upper extremities.  Normal sensation and strength in lower extremities.  No facial droop.  Pupils are reactive bilaterally.  Patient is alert and oriented.  Skin: Capillary refill takes less than 2 seconds. He is not diaphoretic. No erythema. No pallor.  Psychiatric: He has a normal mood and affect.  Nursing note and vitals reviewed.    ED Treatments / Results  Labs (all labs ordered are listed, but only abnormal results are displayed) Labs Reviewed  CBC WITH DIFFERENTIAL/PLATELET - Abnormal; Notable for the following components:      Result Value   WBC 12.7 (*)  Neutro Abs 9.7 (*)    Monocytes Absolute 1.7 (*)    All other components within normal limits  COMPREHENSIVE METABOLIC PANEL - Abnormal; Notable for the following components:   Sodium 130 (*)    Chloride 94 (*)    Glucose, Bld 110 (*)    ALT 15 (*)    All other components within normal limits  MAGNESIUM - Abnormal; Notable for the following components:   Magnesium 1.6 (*)    All other components within normal limits  URINE CULTURE  URINALYSIS, ROUTINE W REFLEX MICROSCOPIC  TROPONIN I    EKG EKG Interpretation  Date/Time:  Saturday July 15 2017 11:18:34 EDT Ventricular Rate:  80 PR Interval:    QRS Duration: 81 QT Interval:  381 QTC Calculation: 440 R Axis:   -25 Text Interpretation:  Sinus rhythm Ventricular premature complex Inferior infarct, old Baseline wander in lead(s) V4 When comapred to prior,  new PVC seen.  No STEMI Confirmed by Antony Blackbird [phone]) on 07/15/2017 11:36:52 AM   Radiology Dg Chest 2 View  Result Date: 07/15/2017 CLINICAL DATA:  Fall last night.  Pain. EXAM: CHEST - 2 VIEW COMPARISON:  March 14, 2016 FINDINGS: Prominence to the right-sided mediastinum is stable and of no acute significance. There is a torturous thoracic aorta. The heart hila are stable. No pneumothorax. The lungs are clear other than minimal scarring in the lateral left lung base. There is anterior wedging of a lower thoracic vertebral body with approximately 30% loss of anterior height, not seen in 2017. Wedging of an upper thoracic vertebral body is stable. IMPRESSION: 1. Anterior wedging of a lower thoracic vertebral body, new since 2017, is otherwise age indeterminate. Given the history of a fall, an acute compression fracture is not excluded. Recommend clinical correlation. Electronically Signed   By: Dorise Bullion III M.D   On: 07/15/2017 12:50   Ct Head Wo Contrast  Result Date: 07/15/2017 CLINICAL DATA:  Fall last night. Neck pain and headache. Abrasion to head. Initial encounter. EXAM: CT HEAD WITHOUT CONTRAST CT CERVICAL SPINE WITHOUT CONTRAST TECHNIQUE: Multidetector CT imaging of the head and cervical spine was performed following the standard protocol without intravenous contrast. Multiplanar CT image reconstructions of the cervical spine were also generated. COMPARISON:  CT of the head on 04/01/2016 FINDINGS: CT HEAD FINDINGS Brain: No evidence of acute infarction, hemorrhage, hydrocephalus, extra-axial collection or mass lesion/mass effect. Vascular: No hyperdense vessel or unexpected calcification. Skull: There are at least 3 new focal lytic lesions of the upper skull. At the vertex, a 10 mm right posterior parietal lytic lesion is present. A high left frontal lytic lesion measures 10 mm. Slightly below this level an additional left frontal lytic lesion measures approximately 13 mm. These are not seen on the prior head CT. Differential considerations include multiple myeloma or other metastatic disease. Sinuses/Orbits: No acute finding. Other: None. CT CERVICAL SPINE FINDINGS There is an acute fracture of the C2  vertebral body at the base of the dens with posterior angulation. The dens is displaced posteriorly approximately 4 mm. This does not cause significant canal stenosis. Diffuse degenerative disease is present throughout the cervical spine. Associated 3 mm anterolisthesis of C4 on C5. The C5 and C6 vertebral bodies are fused. The C6-7 disc space is severely narrowed. There is a compression deformity of the T4 vertebral body with approximately 40-50% loss of height. This may be old based on appearance. No bony lesions identified. Upper chest: Negative. IMPRESSION: 1. Three new small  lytic lesions in the upper skull are present not previously seen by CT in 2017. Differential considerations include multiple myeloma versus other metastatic disease. 2. Acute fracture of C2 at the base of the dens with mild posterior angulation and posterior displacement of approximately 4 mm. 3. Compression deformity of the T4 vertebral body with approximately 40-50% loss of height. This may be old based on appearance. 4. These results were called by telephone at the time of interpretation on 07/15/2017 at 1:08 pm to Dr. Marda Stalker , who verbally acknowledged these results. Electronically Signed   By: Aletta Edouard M.D.   On: 07/15/2017 13:09   Ct Cervical Spine Wo Contrast  Result Date: 07/15/2017 CLINICAL DATA:  Fall last night. Neck pain and headache. Abrasion to head. Initial encounter. EXAM: CT HEAD WITHOUT CONTRAST CT CERVICAL SPINE WITHOUT CONTRAST TECHNIQUE: Multidetector CT imaging of the head and cervical spine was performed following the standard protocol without intravenous contrast. Multiplanar CT image reconstructions of the cervical spine were also generated. COMPARISON:  CT of the head on 04/01/2016 FINDINGS: CT HEAD FINDINGS Brain: No evidence of acute infarction, hemorrhage, hydrocephalus, extra-axial collection or mass lesion/mass effect. Vascular: No hyperdense vessel or unexpected calcification. Skull:  There are at least 3 new focal lytic lesions of the upper skull. At the vertex, a 10 mm right posterior parietal lytic lesion is present. A high left frontal lytic lesion measures 10 mm. Slightly below this level an additional left frontal lytic lesion measures approximately 13 mm. These are not seen on the prior head CT. Differential considerations include multiple myeloma or other metastatic disease. Sinuses/Orbits: No acute finding. Other: None. CT CERVICAL SPINE FINDINGS There is an acute fracture of the C2 vertebral body at the base of the dens with posterior angulation. The dens is displaced posteriorly approximately 4 mm. This does not cause significant canal stenosis. Diffuse degenerative disease is present throughout the cervical spine. Associated 3 mm anterolisthesis of C4 on C5. The C5 and C6 vertebral bodies are fused. The C6-7 disc space is severely narrowed. There is a compression deformity of the T4 vertebral body with approximately 40-50% loss of height. This may be old based on appearance. No bony lesions identified. Upper chest: Negative. IMPRESSION: 1. Three new small lytic lesions in the upper skull are present not previously seen by CT in 2017. Differential considerations include multiple myeloma versus other metastatic disease. 2. Acute fracture of C2 at the base of the dens with mild posterior angulation and posterior displacement of approximately 4 mm. 3. Compression deformity of the T4 vertebral body with approximately 40-50% loss of height. This may be old based on appearance. 4. These results were called by telephone at the time of interpretation on 07/15/2017 at 1:08 pm to Dr. Marda Stalker , who verbally acknowledged these results. Electronically Signed   By: Aletta Edouard M.D.   On: 07/15/2017 13:09    Procedures Procedures (including critical care time)  Medications Ordered in ED Medications  fentaNYL (SUBLIMAZE) injection 50 mcg (50 mcg Intravenous Given 07/15/17 1237)    fentaNYL (SUBLIMAZE) injection 50 mcg (50 mcg Intravenous Given 07/15/17 1534)     Initial Impression / Assessment and Plan / ED Course  I have reviewed the triage vital signs and the nursing notes.  Pertinent labs & imaging results that were available during my care of the patient were reviewed by me and considered in my medical decision making (see chart for details).     ELEK HOLDERNESS is a  84 y.o. male with a past medical history significant for hypertension, chronic kidney disease, and prior stroke on Plavix who presents with a fall.  Patient reports that he stood up after sitting at a desk last night and got lightheaded.  He denied any palpitations shortness of breath or chest pain but ended up falling to the ground.  He denied loss of consciousness but hit his forehead on the ground.  He reports onset of headache and neck pain.  He was evaluated by EMS but did not want to be transported to the emergency department.  He reports that today his pain in the neck has persisted and he is continued to have some headaches.  He denies any nausea, vomiting, or vision changes.  He denies any numbness, weakness, or tingling of his extremities.  He denies any coordination problems.  He denies any other symptoms including no recent chest pain, abdominal pain, urinary symptoms or GI symptoms.  He denies fevers or chills otherwise.  He denies any other complaints.    On exam, patient had tenderness in the cervical spine.  Patient has an abrasion/skin tear to the forehead that is hemostatic.  It does not appear to be amenable to sutures.  Will  washout and dressed the wound.  No focal neurologic deficits seen.  Lungs clear and chest nontender.  Abdomen nontender.  No other injuries are seen.  Has patient had a fall on Plavix, patient or infection.  CT of the head showed new lytic lesions in the skull.  No evidence of intracranial injury.  Will have CT of the head, neck, and workup to look for occult injury  cervical spine showed a C2 fracture with some angulation and displacement.  Chest x-ray shows no pneumonia but does show evidence of an older thoracic compression fracture.     Patient's laboratory testing showed negative troponin.  Magnesium was just barely low.  Patient will eat at home.  EKG showed no STEMI.  Electrolytes showed mild hyponatremia but potassium was normal.  Kidney function was unremarkable.  BC shows mild leukocytosis, possible demargination from his traumatic injury with pain.  Neurosurgery was called for the C2 fracture and the lytic skull lesion.  They recommended patient be placed in a cervical mobilization collar which she already was placed into.  Patient will keep this in place and follow-up with him in 2 weeks.  Patient was informed of the lytic lesion discovery and he is to follow-up with his PCP for this.  Considerations were metastasis versus multiple myeloma.  Patient's kidney function was elevated and his calcium was normal so it is unclear what the etiology of these lesions will be.  Do not feel patient needs admission at this time given his well appearance and improved pain with medications.    Patient reports he takes Norco at home and will be given several more to take at home.  Patient will be careful not to fall again.  Patient understands return precautions as well as follow-up instructions with his PCP and his neurosurgeon.  Patient understands return precautions for any new or worsening numbness, tingling, or weakness of his extremities.  Patient and family had no other questions or concerns and patient was discharged in good condition.    Final Clinical Impressions(s) / ED Diagnoses   Final diagnoses:  Fall, initial encounter  Near syncope  Abrasion of forehead, initial encounter  Other closed displaced fracture of second cervical vertebra, initial encounter ([place])  Lytic bone lesions on xray  ED Discharge Orders        Ordered     HYDROcodone-acetaminophen (NORCO/VICODIN) 5-325 MG tablet  Every 6 hours PRN     07/15/17 1457      Clinical Impression: 1. Fall, initial encounter   2. Near syncope   3. Abrasion of forehead, initial encounter   4. Other closed displaced fracture of second cervical vertebra, initial encounter ([place])   5. Lytic bone lesions on xray     Disposition: Discharge  Condition: Good  I have discussed the results, Dx and Tx plan with the pt(& family if present). He/she/they expressed understanding and agree(s) with the plan. Discharge instructions discussed at great length. Strict return precautions discussed and pt &/or family have verbalized understanding of the instructions. No further questions at time of discharge.    New Prescriptions   HYDROCODONE-ACETAMINOPHEN (NORCO/VICODIN) 5-325 MG TABLET    Take 1 tablet by mouth every 6 (six) hours as needed.    Follow Up: Eustace Moore, MD [address] [place] [address] [phone]  Schedule an appointment as soon as possible for a visit in 2 weeks      Mashawn Brazil, Gwenyth Allegra, MD 07/15/17 [phone]

## 2017-07-15 NOTE — ED Notes (Signed)
Pt on cardiac monitor and auto VS 

## 2017-07-15 NOTE — ED Notes (Signed)
Paged Neurosurgery per Dr. Sherry Ruffing @ 2:00pm

## 2017-07-16 LAB — URINE CULTURE: Culture: <10000 — AB

## 2017-07-20 ENCOUNTER — Ambulatory Visit: Payer: Medicare Other | Admitting: Family Medicine

## 2017-07-20 ENCOUNTER — Encounter: Payer: Self-pay | Admitting: Family Medicine

## 2017-07-20 VITALS — BP 104/62 | HR 58 | Temp 97.3°F | Resp 16 | Ht 65.5 in | Wt 160.2 lb

## 2017-07-20 DIAGNOSIS — M899 Disorder of bone, unspecified: Secondary | ICD-10-CM | POA: Diagnosis not present

## 2017-07-20 DIAGNOSIS — R55 Syncope and collapse: Secondary | ICD-10-CM | POA: Diagnosis not present

## 2017-07-20 DIAGNOSIS — R42 Dizziness and giddiness: Secondary | ICD-10-CM

## 2017-07-20 DIAGNOSIS — W19XXXD Unspecified fall, subsequent encounter: Secondary | ICD-10-CM

## 2017-07-20 DIAGNOSIS — S12100A Unspecified displaced fracture of second cervical vertebra, initial encounter for closed fracture: Secondary | ICD-10-CM

## 2017-07-20 DIAGNOSIS — N183 Chronic kidney disease, stage 3 unspecified: Secondary | ICD-10-CM

## 2017-07-20 DIAGNOSIS — S0181XA Laceration without foreign body of other part of head, initial encounter: Secondary | ICD-10-CM | POA: Diagnosis not present

## 2017-07-20 DIAGNOSIS — M542 Cervicalgia: Secondary | ICD-10-CM | POA: Diagnosis not present

## 2017-07-20 MED ORDER — HYDROCODONE-ACETAMINOPHEN 5-325 MG PO TABS: 1.0000 | ORAL_TABLET | Freq: Four times a day (QID) | ORAL | 0 refills | Status: DC | PRN

## 2017-07-20 NOTE — Addendum Note (Signed)
Addended by: Onalee Hua on: 07/20/2017 02:04 PM   Modules accepted: Orders

## 2017-07-20 NOTE — Progress Notes (Signed)
OFFICE VISIT  07/20/2017   CC:  Chief Complaint  Patient presents with  . Follow-up    ER visit   HPI:    Patient is a 84 y.o. Caucasian male who presents for ER follow up.  Reviewed all ED notes/records today. Went to ED 07/15/17 (5 days ago) for a recent fall in which he sustained a forehead abrasion/laceration and had posterior midline neck pain. Had near syncopal event that resulted in the fall--was sitting down working on his computer.  He stood up, legs felt weak and gave way, felt lightheaded and "dizzy and fell.  Denies LOC.  No bp checked.  They called EMS but they made no comment on his bp.  He refused transport to the hospital b/c he was not hurting at that time.  Pain then came on in about 8-12 hours. Pt found to have 3 new lytic skull lesions and C2 fracture on imaging in the ED. Neurosurgery recommended cervical immobilization collar and f/u with them in 2 wks.  He was instructed to f/u with me regarding his lytic skull lesions. Forehead laceration did not require suturing.  He has no further episodes of presyncope.  He is in pain in neck still, extends into back of head.   Wife says he doesn't drink much fluids.  No home bp checks since ED visit. He denies any neck or skull pain preceding this recent fall.   DG chest 2 view, 07/15/17 IMPRESSION: 1. Anterior wedging of a lower thoracic vertebral body, new since 2017, is otherwise age indeterminate. Given the history of a fall, an acute compression fracture is not excluded. Recommend clinical correlation.   CT head and C-spine w/out contrast 07/15/17:  IMPRESSION: 1. Three new small lytic lesions in the upper skull are present not previously seen by CT in 2017. Differential considerations include multiple myeloma versus other metastatic disease. 2. Acute fracture of C2 at the base of the dens with mild posterior angulation and posterior displacement of approximately 4 mm. 3. Compression deformity of the T4 vertebral body with  approximately 40-50% loss of height. This may be old based on appearance.  ROS: mild chronic throat clearing type of cough.  No hemoptysis.  No blood in urine.  No CP or SOB with his normal activities.  No abd pain.  No melena or hematochezia.  No abnormal-appearing skin lesions. Has chronic nocturia and hesitancy.  Denies any bone pain.  Past Medical History:  Diagnosis Date  . Chronic lower back pain   . Chronic renal insufficiency, stage III (moderate) (HCC)    GFR 50s-60s  . Colon cancer ([place]) 1993  . CVA (cerebral vascular accident) ([place])    Left MCA territory.  Carotid dopplers ok, Brain MRA ok, ECHO with grd I DD.  [name] Hypertension   . Lumbar spinal stenosis    Surgery 2012 helped but in 2015 pain has returned  . Osteoarthritis   . Venous insufficiency of both lower extremities    made worse by amlodipine    Past Surgical History:  Procedure Laterality Date  . CHOLECYSTECTOMY  1984  . INGUINAL HERNIA REPAIR  1974   Bilat  . LUMBAR SPINE SURGERY  2012   for spinal stenosis (Dr. Jonelle [place]  . LUNG LOBECTOMY  1993   LLL per pt--worry of possible cancer but turned out to be benign.  [name] PARTIAL COLECTOMY  1993   for colon cancer    Outpatient Medications Prior to Visit  Medication Sig Dispense Refill  .  amLODipine (NORVASC) 10 MG tablet TAKE 1 TABLET BY MOUTH DAILY 90 tablet 1  . atenolol (TENORMIN) 100 MG tablet TAKE 1 TABLET BY MOUTH DAILY 90 tablet 1  . atorvastatin (LIPITOR) 10 MG tablet TAKE ONE TABLET BY MOUTH ONCE DAILY 90 tablet 1  . cholecalciferol (VITAMIN D) 1000 UNITS tablet Take 1,000 Units by mouth daily.    . clopidogrel (PLAVIX) 75 MG tablet TAKE 1 TABLET BY MOUTH DAILY 90 tablet 1  . enalapril (VASOTEC) 10 MG tablet TAKE ONE TABLET BY MOUTH TWICE DAILY. 180 tablet 1  . hydrochlorothiazide (MICROZIDE) 12.5 MG capsule Take 1 capsule (12.5 mg total) by mouth daily. 90 capsule 3  . HYDROcodone-acetaminophen (NORCO) 5-325 MG tablet Take 0.5 tablets by mouth  every 6 (six) hours as needed for severe pain. 60 tablet 0  . vitamin C (ASCORBIC ACID) 500 MG tablet Take 500 mg by mouth daily.    . vitamin E 400 UNIT capsule Take 400 Units by mouth every other day.    . chlorhexidine (PERIDEX) 0.12 % solution   0  . HYDROcodone-acetaminophen (NORCO/VICODIN) 5-325 MG tablet Take 1 tablet by mouth every 6 (six) hours as needed. (Patient not taking: Reported on 07/20/2017) 10 tablet 0  . Hypromellose (ARTIFICIAL TEARS OP) Place 1 drop into both eyes daily.     No facility-administered medications prior to visit.     No Known Allergies  ROS As per HPI  PE: Initial bp today 87/61.  Repeat manual bp at end of o/v was 104/62. Blood pressure (!) 87/61, pulse (!) 58, temperature (!) 97.3 F (36.3 C), temperature source Oral, resp. rate 16, height 5' 5.5" (1.664 m), weight 160 lb 4 oz (72.7 kg), SpO2 90 %.Repeat 02 sat 93% with deep breaths. Gen: alert, NAD, moves slowly with cane. Lucid thought and speech.   Denies feeling dizzy while sitting or upon standing. IPJ:ASNK: no injection, icteris, swelling, or exudate.  EOMI, PERRLA. Mouth: lips without lesion/swelling.  Oral mucosa pink and moist. Oropharynx without erythema, exudate, or swelling.  NECK: has C collar on. CV: RRR, no m/r/g.  Radial pulses 1+ bilat. Chest is clear, no wheezing or rales. Normal symmetric air entry throughout both lung fields. No chest wall deformities or tenderness. ABD: soft, NT/ND. EXT: no clubbing, cyanosis, or edema.  Neuro: CN 2-12 intact bilaterally, strength 5/5 in proximal and distal upper extremities and lower extremities bilaterally.  No tremor.   LABS:  No results found for: TSH Lab Results  Component Value Date   WBC 12.7 (H) 07/15/2017   HGB 13.9 07/15/2017   HCT 39.4 07/15/2017   MCV 89.3 07/15/2017   PLT 222 07/15/2017   Lab Results  Component Value Date   CREATININE 0.97 07/15/2017   BUN 19 07/15/2017   NA 130 (L) 07/15/2017   K 4.2 07/15/2017   CL  94 (L) 07/15/2017   CO2 25 07/15/2017   Lab Results  Component Value Date   ALT 15 (L) 07/15/2017   AST 24 07/15/2017   ALKPHOS 72 07/15/2017   BILITOT 0.9 07/15/2017   Lab Results  Component Value Date   TROPONINI <0.03 07/15/2017    IMPRESSION AND PLAN:  1) Hypotension; suspect overmedicated from bp meds in the setting of dehydration.  Asymptomatic. Stop all bp meds until further notice. Monitor bp and hr at home.  2) Lytic skull lesions: worry of multiple myeloma vs metastatic cancer. SPEP, UPEP, BMET, PSA, hemoccults x3?, CT C/A/P if all these labs normal (esp with  mild hypoxia here today), TSH, UA. Will wait until f/u in 5-6 days to discuss/arrange bone scan. Considering CT C/A/P if all these labs normal (esp with mild hypoxia here today), vs just get him to hem/onc and let them guide things regarding next imaging step.  3) C2 fracture--with mild posterior angulation and posterior displacement: C collar as per neurosurg rec's, has neurosurg appt scheduled.   Vicodin 5/325, 1 tab q6h prn, #60.  He still has to fill the rx from ED for #10 tabs of this b/c he's been using some leftover vicodin from his last rx from me at home.  Pt says he'll run out of his at home and the #10 from ED prior to follow up with me.  4) Presyncope + fall.  No further episodes of presyncope. Neuro exam normal today.  He is weaker than normal --generalized, but is doing ok ambulating with cane here today and uses walker in his home.  An After Visit Summary was printed and given to the patient.  FOLLOW UP: Return for f/u 5-6 days (30 min appt): neck pain, bone lesions, labs review. + discuss arranging bone scan +/- any other imaging/referral.  Signed:  Crissie Sickles, MD           07/20/2017

## 2017-07-20 NOTE — Addendum Note (Signed)
Addended by: Onalee Hua on: 07/20/2017 02:19 PM   Modules accepted: Orders

## 2017-07-20 NOTE — Addendum Note (Signed)
Addended by: Onalee Hua on: 07/20/2017 01:04 PM   Modules accepted: Orders

## 2017-07-20 NOTE — Patient Instructions (Addendum)
Stop all blood pressure medications: amlodipine, atenolol, enalapril, and hydrochlorothiazide.  Drink 5-6 large glasses of water per day.  Check blood pressure and heart rate 1-2 times per day until I see you back in the office in 5-6 days.

## 2017-07-21 LAB — CBC WITH DIFFERENTIAL/PLATELET
BASOS PCT: 1.1 %
Basophils Absolute: 100 cells/uL (ref 0–200)
Eosinophils Absolute: 273 cells/uL (ref 15–500)
Eosinophils Relative: 3 %
HCT: 38.3 % — ABNORMAL LOW (ref 38.5–50.0)
HEMOGLOBIN: 13.1 g/dL — AB (ref 13.2–17.1)
LYMPHS ABS: 1138 {cells}/uL (ref 850–3900)
MCH: 30.9 pg (ref 27.0–33.0)
MCHC: 34.2 g/dL (ref 32.0–36.0)
MCV: 90.3 fL (ref 80.0–100.0)
MPV: 10 fL (ref 7.5–12.5)
Monocytes Relative: 13.6 %
NEUTROS ABS: 6352 {cells}/uL (ref 1500–7800)
Neutrophils Relative %: 69.8 %
PLATELETS: 262 10*3/uL (ref 140–400)
RBC: 4.24 10*6/uL (ref 4.20–5.80)
RDW: 12.7 % (ref 11.0–15.0)
Total Lymphocyte: 12.5 %
WBC: 9.1 10*3/uL (ref 3.8–10.8)
WBCMIX: 1238 {cells}/uL — AB (ref 200–950)

## 2017-07-21 LAB — COMPREHENSIVE METABOLIC PANEL
AG Ratio: 1.5 (calc) (ref 1.0–2.5)
ALBUMIN MSPROF: 3.7 g/dL (ref 3.6–5.1)
ALT: 39 U/L (ref 9–46)
AST: 30 U/L (ref 10–35)
Alkaline phosphatase (APISO): 123 U/L — ABNORMAL HIGH (ref 40–115)
BILIRUBIN TOTAL: 0.7 mg/dL (ref 0.2–1.2)
BUN/Creatinine Ratio: 23 (calc) — ABNORMAL HIGH (ref 6–22)
BUN: 37 mg/dL — AB (ref 7–25)
CO2: 25 mmol/L (ref 20–32)
CREATININE: 1.6 mg/dL — AB (ref 0.70–1.11)
Calcium: 9.4 mg/dL (ref 8.6–10.3)
Chloride: 95 mmol/L — ABNORMAL LOW (ref 98–110)
Globulin: 2.5 g/dL (calc) (ref 1.9–3.7)
Glucose, Bld: 100 mg/dL — ABNORMAL HIGH (ref 65–99)
Potassium: 4.4 mmol/L (ref 3.5–5.3)
SODIUM: 133 mmol/L — AB (ref 135–146)
TOTAL PROTEIN: 6.2 g/dL (ref 6.1–8.1)

## 2017-07-21 LAB — URINALYSIS, ROUTINE W REFLEX MICROSCOPIC
Bilirubin Urine: NEGATIVE
GLUCOSE, UA: NEGATIVE
Hgb urine dipstick: NEGATIVE
Ketones, ur: NEGATIVE
LEUKOCYTES UA: NEGATIVE
Nitrite: NEGATIVE
PH: 6 (ref 5.0–8.0)
PROTEIN: NEGATIVE
SPECIFIC GRAVITY, URINE: 1.016 (ref 1.001–1.03)

## 2017-07-21 LAB — SEDIMENTATION RATE: Sed Rate: 25 mm/h — ABNORMAL HIGH (ref 0–20)

## 2017-07-21 LAB — PSA: PSA: 2.5 ng/mL (ref <=4.0)

## 2017-07-21 LAB — TSH: TSH: 1.33 m[IU]/L (ref 0.40–4.50)

## 2017-07-21 LAB — LIPASE: Lipase: 28 U/L (ref 7–60)

## 2017-07-24 DIAGNOSIS — N183 Chronic kidney disease, stage 3 (moderate): Secondary | ICD-10-CM | POA: Diagnosis not present

## 2017-07-24 DIAGNOSIS — M899 Disorder of bone, unspecified: Secondary | ICD-10-CM | POA: Diagnosis not present

## 2017-07-24 LAB — IMMUNOFIXATION, SERUM
IgA/Immunoglobulin A, Serum: 339 mg/dL (ref 61–437)
IgG (Immunoglobin G), Serum: 847 mg/dL (ref 700–1600)
IgM (Immunoglobulin M), Srm: 32 mg/dL (ref 15–143)

## 2017-07-24 LAB — PROTEIN ELECTROPHORESIS, SERUM
ALBUMIN ELP: 3.7 g/dL — AB (ref 3.8–4.8)
ALPHA 1: 0.4 g/dL — AB (ref 0.2–0.3)
Alpha 2: 0.8 g/dL (ref 0.5–0.9)
Beta 2: 0.4 g/dL (ref 0.2–0.5)
Beta Globulin: 0.4 g/dL (ref 0.4–0.6)
Gamma Globulin: 0.8 g/dL (ref 0.8–1.7)
Total Protein: 6.5 g/dL (ref 6.1–8.1)

## 2017-07-25 LAB — UPEP/UIFE/LIGHT CHAINS/TP, 24-HR UR
% BETA, URINE: 0 %
ALBUMIN, U: 100 %
ALPHA 1 URINE: 0 %
ALPHA-2-GLOBULIN, U: 0 %
FREE KAPPA LT CHAINS, UR: 37.9 mg/L — AB (ref 1.35–24.19)
Free Lambda Lt Chains,Ur: 2.54 mg/L (ref 0.24–6.66)
GAMMA GLOBULIN URINE: 0 %
KAPPA/LAMBDA RATIO, U: 14.92 — AB (ref 2.04–10.37)
Protein, Ur: <4 mg/dL

## 2017-07-26 ENCOUNTER — Telehealth: Payer: Self-pay | Admitting: Family Medicine

## 2017-07-26 ENCOUNTER — Ambulatory Visit: Payer: Medicare Other | Admitting: Family Medicine

## 2017-07-26 ENCOUNTER — Encounter: Payer: Self-pay | Admitting: Family Medicine

## 2017-07-26 VITALS — BP 141/108 | HR 99 | Temp 98.3°F | Resp 16 | Ht 65.5 in | Wt 162.0 lb

## 2017-07-26 DIAGNOSIS — M899 Disorder of bone, unspecified: Secondary | ICD-10-CM | POA: Diagnosis not present

## 2017-07-26 DIAGNOSIS — M8950 Osteolysis, unspecified site: Secondary | ICD-10-CM

## 2017-07-26 DIAGNOSIS — I10 Essential (primary) hypertension: Secondary | ICD-10-CM | POA: Diagnosis not present

## 2017-07-26 NOTE — Telephone Encounter (Signed)
Patient notified that the medication was Atenolol which was on the VIS given to patient. Patient verbalized understanding.

## 2017-07-26 NOTE — Progress Notes (Signed)
OFFICE VISIT  07/26/2017   CC:  Chief Complaint  Patient presents with  . Follow-up    Neck pain   HPI:    Patient is a 84 y.o. Caucasian male who presents accompanied by his wife for f/u C2 fracture, lytic skull lesions. Pain in neck/back of head is much improved.  He is starting to take his vicodin just prn now.  No home bp checks--we stopped his bp meds last visit b/c of low bp. His bp is back up here today.  We reviewed his recent blood and urine testing that were done as part of further w/u for lytic lesions in skull found incidentally on recent plain film done after a fall.  Past Medical History:  Diagnosis Date  . Chronic lower back pain   . Chronic renal insufficiency, stage III (moderate) (HCC)    GFR 50s-60s  . Colon cancer ([place]) 1993  . CVA (cerebral vascular accident) ([place])    Left MCA territory.  Carotid dopplers ok, Brain MRA ok, ECHO with grd I DD.  [name] Hypertension   . Lumbar spinal stenosis    Surgery 2012 helped but in 2015 pain has returned  . Osteoarthritis   . Venous insufficiency of both lower extremities    made worse by amlodipine    Past Surgical History:  Procedure Laterality Date  . CHOLECYSTECTOMY  1984  . INGUINAL HERNIA REPAIR  1974   Bilat  . LUMBAR SPINE SURGERY  2012   for spinal stenosis (Dr. Jonelle Sports in [place]  . LUNG LOBECTOMY  1993   LLL per pt--worry of possible cancer but turned out to be benign.  [name] PARTIAL COLECTOMY  1993   for colon cancer    Outpatient Medications Prior to Visit  Medication Sig Dispense Refill  . atorvastatin (LIPITOR) 10 MG tablet TAKE ONE TABLET BY MOUTH ONCE DAILY 90 tablet 1  . cholecalciferol (VITAMIN D) 1000 UNITS tablet Take 1,000 Units by mouth daily.    . clopidogrel (PLAVIX) 75 MG tablet TAKE 1 TABLET BY MOUTH DAILY 90 tablet 1  . hydrochlorothiazide (MICROZIDE) 12.5 MG capsule Take 1 capsule (12.5 mg total) by mouth daily. 90 capsule 3  . HYDROcodone-acetaminophen (NORCO) 5-325 MG tablet Take 1  tablet by mouth every 6 (six) hours as needed for severe pain. 60 tablet 0  . vitamin C (ASCORBIC ACID) 500 MG tablet Take 500 mg by mouth daily.    . vitamin E 400 UNIT capsule Take 400 Units by mouth every other day.    [name] amLODipine (NORVASC) 10 MG tablet TAKE 1 TABLET BY MOUTH DAILY (Patient not taking: Reported on 07/26/2017) 90 tablet 1  . atenolol (TENORMIN) 100 MG tablet TAKE 1 TABLET BY MOUTH DAILY (Patient not taking: Reported on 07/26/2017) 90 tablet 1  . enalapril (VASOTEC) 10 MG tablet TAKE ONE TABLET BY MOUTH TWICE DAILY. (Patient not taking: Reported on 07/26/2017) 180 tablet 1   No facility-administered medications prior to visit.     No Known Allergies  ROS As per HPI  PE: Blood pressure (!) 141/108, pulse 99, temperature 98.3 F (36.8 C), temperature source Oral, resp. rate 16, height 5' 5.5" (1.664 m), weight 162 lb (73.5 kg), SpO2 98 %. Gen: Alert, well appearing.  Patient is oriented to person, place, time, and situation. AFFECT: pleasant, lucid thought and speech. Pt in C-collar: not removed today. No further exam today.  LABS:  Lab Results  Component Value Date   TSH 1.33 07/20/2017   Lab Results  Component Value Date   WBC 9.1 07/20/2017   HGB 13.1 (L) 07/20/2017   HCT 38.3 (L) 07/20/2017   MCV 90.3 07/20/2017   PLT 262 07/20/2017   Lab Results  Component Value Date   CREATININE 1.60 (H) 07/20/2017   BUN 37 (H) 07/20/2017   NA 133 (L) 07/20/2017   K 4.4 07/20/2017   CL 95 (L) 07/20/2017   CO2 25 07/20/2017   Lab Results  Component Value Date   ALT 39 07/20/2017   AST 30 07/20/2017   ALKPHOS 72 07/15/2017   BILITOT 0.7 07/20/2017   Lab Results  Component Value Date   CHOL 133 04/13/2017   Lab Results  Component Value Date   HDL 68 04/13/2017   Lab Results  Component Value Date   LDLCALC 49 04/13/2017   Lab Results  Component Value Date   TRIG 80 04/13/2017   Lab Results  Component Value Date   CHOLHDL 2.0 04/13/2017   Lab  Results  Component Value Date   PSA 2.5 07/20/2017   Lab Results  Component Value Date   LIPASE 28 07/20/2017   Lab Results  Component Value Date   ESRSEDRATE 25 (H) 07/20/2017    IMPRESSION AND PLAN:  1) Lytic skull lesions: recent lab w/u revealed everything normal except his baseline CRI and mildly elevated urine free kappa light chains.  Will continue w/u with bone scan. He does need to see hem/onc MD for their expertise, regardless of result of bone scan.  2) HTN: recent hypotension in the setting of pain and dehydration: he can restart bp med one at a time now: restart atenolol 100 mg once a day first.  F/u 1 week.  3) C2 fracture: pain improving.  Vicodin 5/325 qid prn.  He is careful with this med. He has appropriate neurosurg f/u arranged.  An After Visit Summary was printed and given to the patient.  FOLLOW UP: Return in about 1 week (around 08/02/2017) for f/u bp and pain/skull lesions.  Signed:  Crissie Sickles, MD           07/26/2017

## 2017-07-26 NOTE — Telephone Encounter (Signed)
Copied from [place] [id number]. Topic: Quick Communication - See Telephone Encounter >> Jul 26, 2017  3:48 PM Ivar Drape wrote: CRM for notification. See Telephone encounter for: 07/26/17. Patient would like to know which medications are to be added back to his medication list that controls his high blood pressure, because the list they have still has ALL of the medications the provider took the patient off of.

## 2017-07-26 NOTE — Patient Instructions (Signed)
Restart atenolol 100 mg once a day (blood pressure medication).

## 2017-07-31 DIAGNOSIS — S12111A Posterior displaced Type II dens fracture, initial encounter for closed fracture: Secondary | ICD-10-CM | POA: Diagnosis not present

## 2017-08-01 ENCOUNTER — Encounter (HOSPITAL_COMMUNITY)
Admission: RE | Admit: 2017-08-01 | Discharge: 2017-08-01 | Disposition: A | Discharge status: Home / Self Care | Payer: Medicare Other | Source: Ambulatory Visit | Attending: Family Medicine | Admitting: Family Medicine

## 2017-08-01 DIAGNOSIS — M8950 Osteolysis, unspecified site: Secondary | ICD-10-CM | POA: Diagnosis present

## 2017-08-01 DIAGNOSIS — S0990XA Unspecified injury of head, initial encounter: Secondary | ICD-10-CM | POA: Diagnosis not present

## 2017-08-01 MED ORDER — TECHNETIUM TC 99M MEDRONATE IV KIT
20.0000 | PACK | Freq: Once | INTRAVENOUS | Status: AC | PRN
  Administered 2017-08-01: 20 via INTRAVENOUS

## 2017-08-03 ENCOUNTER — Encounter: Payer: Self-pay | Admitting: Family Medicine

## 2017-08-03 ENCOUNTER — Telehealth: Payer: Self-pay | Admitting: Family Medicine

## 2017-08-03 ENCOUNTER — Ambulatory Visit: Payer: Medicare Other | Admitting: Family Medicine

## 2017-08-03 VITALS — BP 147/99 | HR 65 | Temp 98.2°F | Resp 16 | Ht 65.5 in | Wt 164.5 lb

## 2017-08-03 DIAGNOSIS — N183 Chronic kidney disease, stage 3 unspecified: Secondary | ICD-10-CM

## 2017-08-03 DIAGNOSIS — C7951 Secondary malignant neoplasm of bone: Secondary | ICD-10-CM

## 2017-08-03 DIAGNOSIS — I10 Essential (primary) hypertension: Secondary | ICD-10-CM | POA: Diagnosis not present

## 2017-08-03 NOTE — Telephone Encounter (Signed)
Copied from [place] [id number]. Topic: Quick Communication - See Telephone Encounter >> Aug 03, 2017  3:47 PM Rutherford Nail, NT wrote: CRM for notification. See Telephone encounter for: 08/03/17. Patient's wife calling back to let Dr. Anitra Lauth know that the patient does have a follow up appointment with the neuro surgeon for 08/28/17. FYI

## 2017-08-03 NOTE — Patient Instructions (Addendum)
Restart amlodipine 10mg  once daily.

## 2017-08-03 NOTE — Progress Notes (Addendum)
OFFICE VISIT  08/03/2017   CC:  Chief Complaint  Patient presents with  . Follow-up    BP/Pain/Skull Lesion     HPI:    Patient is a 84 y.o. Caucasian male who presents accompanied by his wife for 1 week f/u C2 fracture and lytic skull lesions. Lab w/u had shown all normal except mildly elevated urine free kappa light chains. Also, pt was having some hypotension in the recent setting of pain and dehydration, so we had d/c'd his bp meds. Last visit we restarted his atenolol 100 mg qd. His neck pain was improving and he has now seen the neurosurgeon and so far the instructions is to continue wearing C collar. (Dr. [name]).  Has f/u appt with him set for 08/28/17.  Today we discussed his recent bone scan results: NM bone scan whole body 08/02/17: FINDINGS: Sites of abnormal osseous tracer accumulation are identified in the calvaria, LEFT ribs, BILATERAL femora, questionably proximal LEFT humerus, thoracic spine, and questionably at inferior LEFT SI joint.  Distribution of lesions favors metastatic disease.  Additional likely degenerative type uptake at lumbar spine, wrists, hips, and RIGHT elbow.  Nonspecific abnormal increased tracer localization at the RIGHT second costal cartilage anteriorly.  Expected urinary tract and soft tissue distribution of tracer.  IMPRESSION: Multiple foci of abnormal osseous tracer accumulation within the calvaria, LEFT ribs, spine, BILATERAL distal femora, proximal LEFT humerus and questionably LEFT SI joint in a distribution favoring osseous metastatic disease.  PAIN LEVEL: improving, says taking 1 vicodin pill at a time and it does cause drowsiness.  No falls.   BP: 140s/80s-90s.   Past Medical History:  Diagnosis Date  . Chronic lower back pain   . Chronic renal insufficiency, stage III (moderate) (HCC)    GFR 50s-60s  . Colon cancer ([place]) 1993  . CVA (cerebral vascular accident) ([place])    Left MCA territory.  Carotid dopplers  ok, Brain MRA ok, ECHO with grd I DD.  [name] Hypertension   . Lumbar spinal stenosis    Surgery 2012 helped but in 2015 pain has returned  . Osteoarthritis   . Venous insufficiency of both lower extremities    made worse by amlodipine    Past Surgical History:  Procedure Laterality Date  . CHOLECYSTECTOMY  1984  . INGUINAL HERNIA REPAIR  1974   Bilat  . LUMBAR SPINE SURGERY  2012   for spinal stenosis (Dr. Jonelle Sports in [place]  . LUNG LOBECTOMY  1993   LLL per pt--worry of possible cancer but turned out to be benign.  [name] PARTIAL COLECTOMY  1993   for colon cancer    Outpatient Medications Prior to Visit  Medication Sig Dispense Refill  . amLODipine (NORVASC) 10 MG tablet TAKE 1 TABLET BY MOUTH DAILY 90 tablet 1  . atenolol (TENORMIN) 100 MG tablet TAKE 1 TABLET BY MOUTH DAILY 90 tablet 1  . atorvastatin (LIPITOR) 10 MG tablet TAKE ONE TABLET BY MOUTH ONCE DAILY 90 tablet 1  . cholecalciferol (VITAMIN D) 1000 UNITS tablet Take 1,000 Units by mouth daily.    . clopidogrel (PLAVIX) 75 MG tablet TAKE 1 TABLET BY MOUTH DAILY 90 tablet 1  . enalapril (VASOTEC) 10 MG tablet TAKE ONE TABLET BY MOUTH TWICE DAILY. 180 tablet 1  . hydrochlorothiazide (MICROZIDE) 12.5 MG capsule Take 1 capsule (12.5 mg total) by mouth daily. 90 capsule 3  . HYDROcodone-acetaminophen (NORCO) 5-325 MG tablet Take 1 tablet by mouth every 6 (six) hours as needed for severe  pain. 60 tablet 0  . vitamin C (ASCORBIC ACID) 500 MG tablet Take 500 mg by mouth daily.    . vitamin E 400 UNIT capsule Take 400 Units by mouth every other day.     No facility-administered medications prior to visit.     No Known Allergies  ROS As per HPI  PE: Blood pressure (!) 147/99, pulse 65, temperature 98.2 F (36.8 C), temperature source Oral, resp. rate 16, height 5' 5.5" (1.664 m), weight 164 lb 8 oz (74.6 kg), SpO2 97 %. Gen: Alert, well appearing.  Patient is oriented to person, place, time, and situation. AFFECT: pleasant, lucid  thought and speech. No further exam today.  LABS:    Chemistry      Component Value Date/Time   NA 133 (L) 07/20/2017 1416   K 4.4 07/20/2017 1416   CL 95 (L) 07/20/2017 1416   CO2 25 07/20/2017 1416   BUN 37 (H) 07/20/2017 1416   CREATININE 1.60 (H) 07/20/2017 1416      Component Value Date/Time   CALCIUM 9.4 07/20/2017 1416   ALKPHOS 72 07/15/2017 1213   AST 30 07/20/2017 1416   ALT 39 07/20/2017 1416   BILITOT 0.7 07/20/2017 1416     GFR 06/23/16 = 56.53 Lab Results  Component Value Date   WBC 9.1 07/20/2017   HGB 13.1 (L) 07/20/2017   HCT 38.3 (L) 07/20/2017   MCV 90.3 07/20/2017   PLT 262 07/20/2017   Lab Results  Component Value Date   TSH 1.33 07/20/2017   Lab Results  Component Value Date   PSA 2.5 07/20/2017   Lab Results  Component Value Date   ESRSEDRATE 25 (H) 07/20/2017    IMPRESSION AND PLAN:  1) Metastatic cancer (bone mets); unknown primary. Onc referral ordered 1 week ago, we are in the midst of getting this arranged still. Will hold off on any further imaging w/u to try to locate the primary unless we know that the onc initial appt is > 1wk out. Check BMET w/GFR today---most likely will only be able to get a CT C/A/P if radiology will agree to doing low dose contrast.  2) HTN: bp coming back up. Add amlodipine 10mg  back on for bp control.  Continue atenolol 100mg  qd.  3) C2 fracture: continue C - collar indefinitely per neurosurge. I'll ask for Dr. Ronnald Ramp' office note. Cut vicodin down to 1/2 tab q6h prn.  Spent 25 min with pt today, with >50% of this time spent in counseling and care coordination regarding the above problems.  An After Visit Summary was printed and given to the patient.  FOLLOW UP: Return in about 1 week (around 08/10/2017) for f/u HTN.  Signed:  Crissie Sickles, MD           08/03/2017

## 2017-08-03 NOTE — Addendum Note (Signed)
Addended by: Onalee Hua on: 08/03/2017 03:17 PM   Modules accepted: Orders

## 2017-08-03 NOTE — Telephone Encounter (Signed)
Noted  

## 2017-08-04 LAB — BASIC METABOLIC PANEL
BUN / CREAT RATIO: 17 (calc) (ref 6–22)
BUN: 21 mg/dL (ref 7–25)
CHLORIDE: 103 mmol/L (ref 98–110)
CO2: 28 mmol/L (ref 20–32)
CREATININE: 1.22 mg/dL — AB (ref 0.70–1.11)
Calcium: 9.1 mg/dL (ref 8.6–10.3)
Glucose, Bld: 91 mg/dL (ref 65–99)
Potassium: 4.4 mmol/L (ref 3.5–5.3)
SODIUM: 138 mmol/L (ref 135–146)

## 2017-08-07 ENCOUNTER — Telehealth: Payer: Self-pay | Admitting: Family Medicine

## 2017-08-07 NOTE — Telephone Encounter (Signed)
Left message for pt to call back.   Per Dr. Anitra Lauth since pt has an apt with oncology on Thursday it would be best to hold off on any scans and let the oncologist determine what the next step should be. No scan needed at this time pt needs to see oncology and follow their recommendations.    Okay to transfer call to office ([facility]).

## 2017-08-07 NOTE — Telephone Encounter (Signed)
Patient's wife was transferred to office from [facility] staff.  Advised her of message left in previous note by [facility].  Patient's wife voiced understanding of holding off on any additional scans until patient sees Dr. Marin Olp this week on 08/09/17.

## 2017-08-07 NOTE — Telephone Encounter (Signed)
Copied from [place] [id number]. Topic: Quick Communication - See Telephone Encounter >> Aug 07, 2017 11:38 AM Percell Belt A wrote: CRM for notification. See Telephone encounter for: 08/07/17. Pt wife called in and stated that pt was suppose to get some type of scan done before his app on Thursday?  She is unsure what type of scan this was suppose to be? She said it was suppose to be setup and someone was going to call with and appt??    Best number [phone]

## 2017-08-07 NOTE — Telephone Encounter (Signed)
Spoke to patient's wife via phone call to patient regarding test results. Patient's wife questioning appointment and information was reiterated. Understanding voiced by wife.

## 2017-08-09 ENCOUNTER — Inpatient Hospital Stay: Payer: Medicare Other

## 2017-08-09 ENCOUNTER — Other Ambulatory Visit: Payer: Self-pay

## 2017-08-09 ENCOUNTER — Inpatient Hospital Stay: Payer: Medicare Other | Attending: Hematology & Oncology | Admitting: Hematology & Oncology

## 2017-08-09 ENCOUNTER — Encounter: Payer: Self-pay | Admitting: Hematology & Oncology

## 2017-08-09 VITALS — BP 135/78 | HR 60 | Temp 97.9°F | Resp 20 | Wt 163.0 lb

## 2017-08-09 DIAGNOSIS — L409 Psoriasis, unspecified: Secondary | ICD-10-CM | POA: Diagnosis not present

## 2017-08-09 DIAGNOSIS — N183 Chronic kidney disease, stage 3 unspecified: Secondary | ICD-10-CM

## 2017-08-09 DIAGNOSIS — Z85038 Personal history of other malignant neoplasm of large intestine: Secondary | ICD-10-CM | POA: Insufficient documentation

## 2017-08-09 DIAGNOSIS — I129 Hypertensive chronic kidney disease with stage 1 through stage 4 chronic kidney disease, or unspecified chronic kidney disease: Secondary | ICD-10-CM | POA: Insufficient documentation

## 2017-08-09 DIAGNOSIS — M48061 Spinal stenosis, lumbar region without neurogenic claudication: Secondary | ICD-10-CM | POA: Diagnosis not present

## 2017-08-09 DIAGNOSIS — Z79899 Other long term (current) drug therapy: Secondary | ICD-10-CM | POA: Diagnosis not present

## 2017-08-09 DIAGNOSIS — C7951 Secondary malignant neoplasm of bone: Secondary | ICD-10-CM

## 2017-08-09 DIAGNOSIS — Z9181 History of falling: Secondary | ICD-10-CM | POA: Diagnosis not present

## 2017-08-09 DIAGNOSIS — M1990 Unspecified osteoarthritis, unspecified site: Secondary | ICD-10-CM | POA: Diagnosis not present

## 2017-08-09 DIAGNOSIS — M898X9 Other specified disorders of bone, unspecified site: Secondary | ICD-10-CM

## 2017-08-09 DIAGNOSIS — R937 Abnormal findings on diagnostic imaging of other parts of musculoskeletal system: Secondary | ICD-10-CM | POA: Insufficient documentation

## 2017-08-09 DIAGNOSIS — C801 Malignant (primary) neoplasm, unspecified: Secondary | ICD-10-CM

## 2017-08-09 DIAGNOSIS — Z87891 Personal history of nicotine dependence: Secondary | ICD-10-CM | POA: Insufficient documentation

## 2017-08-09 DIAGNOSIS — M899 Disorder of bone, unspecified: Secondary | ICD-10-CM

## 2017-08-09 HISTORY — DX: Disorder of bone, unspecified: M89.9

## 2017-08-09 HISTORY — DX: Other specified disorders of bone, unspecified site: M89.8X9

## 2017-08-09 LAB — CBC WITH DIFFERENTIAL (CANCER CENTER ONLY)
BASOS ABS: 0.1 10*3/uL (ref 0.0–0.1)
Basophils Relative: 1 %
Eosinophils Absolute: 0.4 10*3/uL (ref 0.0–0.5)
Eosinophils Relative: 4 %
HEMATOCRIT: 38.1 % — AB (ref 38.7–49.9)
Hemoglobin: 13 g/dL (ref 13.0–17.1)
LYMPHS ABS: 1.4 10*3/uL (ref 0.9–3.3)
LYMPHS PCT: 14 %
MCH: 31.3 pg (ref 28.0–33.4)
MCHC: 34.1 g/dL (ref 32.0–35.9)
MCV: 91.8 fL (ref 82.0–98.0)
Monocytes Absolute: 1.4 10*3/uL — ABNORMAL HIGH (ref 0.1–0.9)
Monocytes Relative: 14 %
NEUTROS ABS: 6.6 10*3/uL — AB (ref 1.5–6.5)
Neutrophils Relative %: 67 %
Platelet Count: 233 10*3/uL (ref 145–400)
RBC: 4.15 MIL/uL — AB (ref 4.20–5.70)
RDW: 13.5 % (ref 11.1–15.7)
WBC Count: 9.8 10*3/uL (ref 4.0–10.0)

## 2017-08-09 LAB — CMP (CANCER CENTER ONLY)
ALT: 20 U/L (ref 10–47)
ANION GAP: 8 (ref 5–15)
AST: 20 U/L (ref 11–38)
Albumin: 3.6 g/dL (ref 3.5–5.0)
Alkaline Phosphatase: 82 U/L (ref 26–84)
BILIRUBIN TOTAL: 0.8 mg/dL (ref 0.2–1.6)
BUN: 22 mg/dL (ref 7–22)
CALCIUM: 9.4 mg/dL (ref 8.0–10.3)
CO2: 30 mmol/L (ref 18–33)
Chloride: 99 mmol/L (ref 98–108)
Creatinine: 1.3 mg/dL — ABNORMAL HIGH (ref 0.60–1.20)
Glucose, Bld: 92 mg/dL (ref 73–118)
Potassium: 3.8 mmol/L (ref 3.3–4.7)
Sodium: 137 mmol/L (ref 128–145)
TOTAL PROTEIN: 6.7 g/dL (ref 6.4–8.1)

## 2017-08-09 NOTE — Progress Notes (Signed)
Referral MD  Reason for Referral: Abnormal bone scan; remote history of colon cancer  Chief Complaint  Patient presents with  . New Patient (Initial Visit)  : I fell and was told I have cancer my bones.  HPI: [name] is an incredibly fascinating 84 year old white male.  He is originally from [place].  [place]  He went to [employer].  He was then taken into the [employer].  He was actually stationed in [place]  during [employer].  He is incredibly fascinating to talk to.  He comes in with his wife.  His memory is incredibly sharp.  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  He apparently had colon cancer back in 1993.  He said he had some dark stools.  He was operated on by the esteemed Dr. Marylene Gonzalez.  He said that he did not need any type of treatment.  He said that he will also had a lung surgery.  Thankfully this was not lung cancer.  He smoked but stopped when he was 84 years old.  He and his wife live in [place].  He apparently was getting up from his computer and fell.  He initially did not do anything about this.  However, the next day, he was having some problems.  He went to the emergency room at [facility].  He had a CT scan of the cervical spine and head.  Surprisingly, this showed 3 small lytic lesions in the upper skull.  He had an acute fracture at C2.  He had a compression deformity at T4.  He comes in wearing a neck brace.  I am not sure if he has seen an orthopedic surgeon or a neurosurgeon.  He then had a bone scan done on April 23 surprisingly, this showed abnormal uptake in the skull, left ribs, spine, distal femurs, left humerus and possibly left SI joint.  He had a PSA which was normal at 2.5.  He then had a bone scan done on he had a protein electrophoresis which did not show a monoclonal spike.  He had a normal TSH.  His sed rate was minimally elevated at 25.  He had a 24-hour urine done.   This did not show any monoclonal light chains.  He was sent to the [facility] for an evaluation and recommendations.  He does have a little bit of psoriasis.  There is no weight loss.  He has had no abnormal skin lesions removed.  There is no history of melanoma.  He has had no change in bowel or bladder habits.  He has had no leg swelling.  Overall, I would have to say that his performance status is ECOG 2.    Past Medical History:  Diagnosis Date  . Chronic lower back pain   . Chronic renal insufficiency, stage III (moderate) (HCC)    GFR 50s-60s  . Colon cancer ([place]) 1993  . CVA (cerebral vascular accident) ([place])    Left MCA territory.  Carotid dopplers ok, Brain MRA ok, ECHO with grd I DD.  [name] Hypertension   . Lumbar spinal stenosis    Surgery 2012 helped but in 2015 pain has returned  . Osteoarthritis   . Venous insufficiency of both lower extremities    made worse by amlodipine  :  Past Surgical History:  Procedure Laterality Date  . CHOLECYSTECTOMY  1984  . INGUINAL HERNIA REPAIR  1974   Bilat  . LUMBAR SPINE SURGERY  2012   for spinal stenosis (Dr. Jonelle [place] in [place]  . LUNG LOBECTOMY  1993   LLL per pt--worry of possible cancer but turned out to be benign.  [name] PARTIAL COLECTOMY  1993   for colon cancer  :   Current Outpatient Medications:  .  amLODipine (NORVASC) 10 MG tablet, TAKE 1 TABLET BY MOUTH DAILY, Disp: 90 tablet, Rfl: 1 .  atenolol (TENORMIN) 100 MG tablet, TAKE 1 TABLET BY MOUTH DAILY, Disp: 90 tablet, Rfl: 1 .  atorvastatin (LIPITOR) 10 MG tablet, TAKE ONE TABLET BY MOUTH ONCE DAILY, Disp: 90 tablet, Rfl: 1 .  cholecalciferol (VITAMIN D) 1000 UNITS tablet, Take 1,000 Units by mouth daily., Disp: , Rfl:  .  clopidogrel (PLAVIX) 75 MG tablet, TAKE 1 TABLET BY MOUTH DAILY, Disp: 90 tablet, Rfl: 1 .  hydrochlorothiazide (MICROZIDE) 12.5 MG capsule, Take 1 capsule (12.5 mg total) by mouth daily., Disp: 90 capsule, Rfl: 3 .   HYDROcodone-acetaminophen (NORCO) 5-325 MG tablet, Take 1 tablet by mouth every 6 (six) hours as needed for severe pain., Disp: 60 tablet, Rfl: 0 .  vitamin C (ASCORBIC ACID) 500 MG tablet, Take 500 mg by mouth daily., Disp: , Rfl:  .  vitamin E 400 UNIT capsule, Take 400 Units by mouth every other day., Disp: , Rfl:  .  enalapril (VASOTEC) 10 MG tablet, TAKE ONE TABLET BY MOUTH TWICE DAILY., Disp: 180 tablet, Rfl: 1:  :  No Known Allergies:  Family History  Problem Relation Age of Onset  . Parkinson's disease Mother   . Heart attack Mother   . Cancer Brother   . Cancer Daughter   :  Social History   Socioeconomic History  . Marital status: Married    Spouse name: Not on file  . Number of children: Not on file  . Years of education: Not on file  . Highest education level: Not on file  Occupational History  . Not on file  Social Needs  . Financial resource strain: Not on file  . Food insecurity:    Worry: Not on file    Inability: Not on file  . Transportation needs:    Medical: Not on file    Non-medical: Not on file  Tobacco Use  . Smoking status: Former Research scientist (life sciences)  . Smokeless tobacco: Never Used  Substance and Sexual Activity  . Alcohol use: Yes    Comment: daily, gin every night  . Drug use: No  . Sexual activity: Not on file  Lifestyle  . Physical activity:    Days per week: Not on file    Minutes per session: Not on file  . Stress: Not on file  Relationships  . Social connections:    Talks on phone: Not on file    Gets together: Not on file    Attends religious service: Not on file    Active member of club or organization: Not on file    Attends meetings of clubs or organizations: Not on file    Relationship status: Not on file  . Intimate partner violence:    Fear of current or ex partner: Not on file    Emotionally abused: Not on file    Physically abused: Not on file    Forced sexual activity: Not on file  Other Topics Concern  . Not on file  Social  History Narrative   Married, 1 son and 1 daughter.   Education: BS  in Marketing.   Occupation: Pharmacologist (retired).   As of 01/2014, living at [facility] with his wife.   No T/A/Ds.    :  Review of Systems  Constitutional: Negative.   HENT: Negative.   Eyes: Negative.   Respiratory: Negative.   Cardiovascular: Negative.   Gastrointestinal: Negative.   Genitourinary: Negative.   Musculoskeletal: Positive for falls and joint pain.  Skin: Negative.   Neurological: Negative.   Endo/Heme/Allergies: Negative.   Psychiatric/Behavioral: Negative.      Exam: Elderly but well-nourished white male in no obvious distress.  Vital signs show temperature of 97.9.  Pulse 60.  Blood pressure 135/78.  Weight is 163 pounds.  Head neck exam shows no ocular or oral lesions.  He does have the cervical collar on.  There is no obvious adenopathy in the neck.  His thyroid is not palpable.  Lungs are clear.  Cardiac exam regular rate and rhythm with no murmurs, rubs or bruits.  Abdomen is soft.  He has good bowel sounds.  There is no fluid wave.  There is no palpable liver or spleen tip.  Back exam shows no tenderness over the spine, ribs or hips.  Extremities shows no clubbing, cyanosis or edema.  He has decent range of motion of his joints.  He has age-related osteoarthritic changes in his joints.  Skin exam does show a couple plaques of psoriasis.  Neurological exam shows no obvious neurological deficits. @IPVITALS @   Recent Labs    08/09/17 1533  WBC 9.8  HGB 13.0  HCT 38.1*  PLT 233   Recent Labs    08/09/17 1533  NA 137  K 3.8  CL 99  CO2 30  GLUCOSE 92  BUN 22  CREATININE 1.30*  CALCIUM 9.4    Blood smear review: None  Pathology: None    Assessment and Plan: [name] is a really nice 84 year old white male.  He has a past history of colon cancer.  It sounds like he had early stage colon cancer since he had no treatment after his surgery.  I am not convinced that he has  any bone cancer.  I cannot imagine what this would be in a 84 year old outside of prostate cancer.  Again, his PSA was normal.  I would not think myeloma would be a possibility.  Typically myeloma lesions do not show up on a bone scan.  He has not smoked for 60 years.  I would not think this would be lung cancer.  Melanoma is always a possibility.  Again he does not state any melanoma that was taken off.  He has psoriasis.  I will know of these lesions on the bone scan might represent some type of psoriatic lesions.  At 84 years old, I really would not imagine that we would consider any treatment for him if he has cancer.  I would think that if he has myeloma, then we might be able to treat.  I would definitely consider a bone hardener for this if we discover malignancy.  He might need radiation.  I am still has sure whether this fracture at C2 was anything pathologic.  We will set him up with a CT scan of his chest abdomen and pelvis.  I do not know if he needs an MRI of his cervical spine.  I spent over an hour with he and his wife.  Over half the time was spent face-to-face.  It was incredibly fascinating talking to him about his experiences and  being in [place].  We will try to make things easy for [name].  He depends on his wife for transportation.

## 2017-08-10 ENCOUNTER — Telehealth: Payer: Self-pay | Admitting: *Deleted

## 2017-08-10 ENCOUNTER — Ambulatory Visit: Payer: Medicare Other | Admitting: Family Medicine

## 2017-08-10 LAB — IGG, IGA, IGM
IGM (IMMUNOGLOBULIN M), SRM: 35 mg/dL (ref 15–143)
IgA: 321 mg/dL (ref 61–437)
IgG (Immunoglobin G), Serum: 875 mg/dL (ref 700–1600)

## 2017-08-10 LAB — CEA (IN HOUSE-CHCC): CEA (CHCC-In House): <1 ng/mL (ref 0.00–5.00)

## 2017-08-10 LAB — KAPPA/LAMBDA LIGHT CHAINS
KAPPA FREE LGHT CHN: 30.7 mg/L — AB (ref 3.3–19.4)
Kappa, lambda light chain ratio: 1.81 — ABNORMAL HIGH (ref 0.26–1.65)
LAMDA FREE LIGHT CHAINS: 17 mg/L (ref 5.7–26.3)

## 2017-08-10 LAB — LACTATE DEHYDROGENASE: LDH: 158 U/L (ref 125–245)

## 2017-08-10 LAB — PROSTATE-SPECIFIC AG, SERUM (LABCORP): PROSTATE SPECIFIC AG, SERUM: 2.8 ng/mL (ref 0.0–4.0)

## 2017-08-10 NOTE — Telephone Encounter (Signed)
Pt advised and voiced understanding.   

## 2017-08-10 NOTE — Telephone Encounter (Signed)
Copied from [place] [id number]. Topic: Quick Communication - Appointment Cancellation >> Aug 10, 2017 10:08 AM Yvette Rack wrote: Patient called to cancel appointment scheduled for 08-10-17. Patient has not rescheduled their appointment.  Route to department's PEC pool.

## 2017-08-10 NOTE — Telephone Encounter (Signed)
Noted: bp is normal. Continue atenolol 100 mg qd and amlodipine 10mg  qd.-thx

## 2017-08-10 NOTE — Progress Notes (Deleted)
OFFICE VISIT  08/10/2017   CC: No chief complaint on file.    HPI:    Patient is a 84 y.o. Caucasian male who presents for 1 wk f/u HTN.  Past Medical History:  Diagnosis Date  . Chronic lower back pain   . Chronic renal insufficiency, stage III (moderate) (HCC)    GFR 50s-60s  . Colon cancer ([place]) 1993  . CVA (cerebral vascular accident) ([place])    Left MCA territory.  Carotid dopplers ok, Brain MRA ok, ECHO with grd I DD.  [name] Hypertension   . Lumbar spinal stenosis    Surgery 2012 helped but in 2015 pain has returned  . Osteoarthritis   . Venous insufficiency of both lower extremities    made worse by amlodipine    Past Surgical History:  Procedure Laterality Date  . CHOLECYSTECTOMY  1984  . INGUINAL HERNIA REPAIR  1974   Bilat  . LUMBAR SPINE SURGERY  2012   for spinal stenosis (Dr. Jonelle Sports in [place])  . LUNG LOBECTOMY  1993   LLL per pt--worry of possible cancer but turned out to be benign.  [name] PARTIAL COLECTOMY  1993   for colon cancer    Outpatient Medications Prior to Visit  Medication Sig Dispense Refill  . amLODipine (NORVASC) 10 MG tablet TAKE 1 TABLET BY MOUTH DAILY 90 tablet 1  . atenolol (TENORMIN) 100 MG tablet TAKE 1 TABLET BY MOUTH DAILY 90 tablet 1  . atorvastatin (LIPITOR) 10 MG tablet TAKE ONE TABLET BY MOUTH ONCE DAILY 90 tablet 1  . cholecalciferol (VITAMIN D) 1000 UNITS tablet Take 1,000 Units by mouth daily.    . clopidogrel (PLAVIX) 75 MG tablet TAKE 1 TABLET BY MOUTH DAILY 90 tablet 1  . enalapril (VASOTEC) 10 MG tablet TAKE ONE TABLET BY MOUTH TWICE DAILY. 180 tablet 1  . hydrochlorothiazide (MICROZIDE) 12.5 MG capsule Take 1 capsule (12.5 mg total) by mouth daily. 90 capsule 3  . HYDROcodone-acetaminophen (NORCO) 5-325 MG tablet Take 1 tablet by mouth every 6 (six) hours as needed for severe pain. 60 tablet 0  . vitamin C (ASCORBIC ACID) 500 MG tablet Take 500 mg by mouth daily.    . vitamin E 400 UNIT capsule Take 400 Units by mouth every other  day.     No facility-administered medications prior to visit.     No Known Allergies  ROS As per HPI  PE: There were no vitals taken for this visit. ***  LABS:  ***  IMPRESSION AND PLAN:  No problem-specific Assessment & Plan notes found for this encounter.   FOLLOW UP: No follow-ups on file.

## 2017-08-10 NOTE — Telephone Encounter (Signed)
SW Dr. Anitra Lauth, please call pt and find out what his last 3 BP readings were.   SW pt, he stated that his BP has been "very good".  124/81 68 123/72 60 133/87 63

## 2017-08-11 LAB — PROTEIN ELECTROPHORESIS, SERUM, WITH REFLEX
A/G RATIO SPE: 1.3 (ref 0.7–1.7)
ALBUMIN ELP: 3.4 g/dL (ref 2.9–4.4)
Alpha-1-Globulin: 0.2 g/dL (ref 0.0–0.4)
Alpha-2-Globulin: 0.7 g/dL (ref 0.4–1.0)
BETA GLOBULIN: 0.8 g/dL (ref 0.7–1.3)
GAMMA GLOBULIN: 0.8 g/dL (ref 0.4–1.8)
Globulin, Total: 2.6 g/dL (ref 2.2–3.9)
TOTAL PROTEIN ELP: 6 g/dL (ref 6.0–8.5)

## 2017-08-16 ENCOUNTER — Ambulatory Visit (HOSPITAL_BASED_OUTPATIENT_CLINIC_OR_DEPARTMENT_OTHER): Payer: Medicare Other

## 2017-08-21 LAB — THYROGLOBULIN LEVEL: THYROGLOBULIN: 3.1 ng/mL

## 2017-08-28 DIAGNOSIS — Z6825 Body mass index (BMI) 25.0-25.9, adult: Secondary | ICD-10-CM | POA: Diagnosis not present

## 2017-08-28 DIAGNOSIS — I10 Essential (primary) hypertension: Secondary | ICD-10-CM | POA: Diagnosis not present

## 2017-08-28 DIAGNOSIS — S12111A Posterior displaced Type II dens fracture, initial encounter for closed fracture: Secondary | ICD-10-CM | POA: Diagnosis not present

## 2017-08-29 ENCOUNTER — Encounter: Payer: Self-pay | Admitting: Family Medicine

## 2017-09-18 ENCOUNTER — Telehealth: Payer: Self-pay | Admitting: Family Medicine

## 2017-09-18 MED ORDER — ATENOLOL 100 MG PO TABS: 100.0000 mg | ORAL_TABLET | Freq: Every day | ORAL | 0 refills | Status: DC

## 2017-09-18 NOTE — Telephone Encounter (Signed)
Copied from [place] [id number]. Topic: Quick Communication - Rx Refill/Question >> Sep 18, 2017 10:43 AM Keene Breath wrote: Medication: atenolol (TENORMIN) 100 MG tablet [[id number]]   Patient's wife called to request medication be sent to alternate pharmacy.  CB# for spouse is [phone]  Preferred Pharmacy (with phone number or street name): [place], [place] - [address] [phone] (Phone) [phone] (Fax)      Agent: Please be advised that RX refills may take up to 3 business days. We ask that you follow-up with your pharmacy.

## 2017-09-19 DIAGNOSIS — L570 Actinic keratosis: Secondary | ICD-10-CM | POA: Diagnosis not present

## 2017-09-19 DIAGNOSIS — L400 Psoriasis vulgaris: Secondary | ICD-10-CM | POA: Diagnosis not present

## 2017-09-20 DIAGNOSIS — Z961 Presence of intraocular lens: Secondary | ICD-10-CM | POA: Diagnosis not present

## 2017-09-20 DIAGNOSIS — H04123 Dry eye syndrome of bilateral lacrimal glands: Secondary | ICD-10-CM | POA: Diagnosis not present

## 2017-09-20 DIAGNOSIS — H35373 Puckering of macula, bilateral: Secondary | ICD-10-CM | POA: Diagnosis not present

## 2017-09-20 DIAGNOSIS — H43813 Vitreous degeneration, bilateral: Secondary | ICD-10-CM | POA: Diagnosis not present

## 2017-09-25 DIAGNOSIS — I10 Essential (primary) hypertension: Secondary | ICD-10-CM | POA: Diagnosis not present

## 2017-09-25 DIAGNOSIS — Z6826 Body mass index (BMI) 26.0-26.9, adult: Secondary | ICD-10-CM | POA: Diagnosis not present

## 2017-09-25 DIAGNOSIS — S12111A Posterior displaced Type II dens fracture, initial encounter for closed fracture: Secondary | ICD-10-CM | POA: Diagnosis not present

## 2017-09-26 ENCOUNTER — Other Ambulatory Visit: Payer: Self-pay | Admitting: Neurological Surgery

## 2017-09-26 DIAGNOSIS — S12111A Posterior displaced Type II dens fracture, initial encounter for closed fracture: Secondary | ICD-10-CM

## 2017-09-28 ENCOUNTER — Other Ambulatory Visit: Payer: Self-pay | Admitting: Family Medicine

## 2017-09-28 MED ORDER — AMLODIPINE BESYLATE 10 MG PO TABS: 10.0000 mg | ORAL_TABLET | Freq: Every day | ORAL | 0 refills | Status: DC

## 2017-09-28 MED ORDER — ATENOLOL 100 MG PO TABS: 100.0000 mg | ORAL_TABLET | Freq: Every day | ORAL | 0 refills | Status: DC

## 2017-09-28 MED ORDER — ATORVASTATIN CALCIUM 10 MG PO TABS: ORAL_TABLET | ORAL | 0 refills | Status: DC

## 2017-09-28 NOTE — Telephone Encounter (Signed)
Wants 90 day supply of each medication

## 2017-09-28 NOTE — Telephone Encounter (Signed)
Copied from [place] [id number]. Topic: Quick Communication - Rx Refill/Question >> Sep 28, 2017 10:16 AM Margot Ables wrote: Medication: amlodipine, atenolol, and atorvastatin- pt is changing to [facility] and needing new RX's sent in per the pharmacy - pt has 3 days left of each Has the patient contacted their pharmacy? Yes - new RX requested Preferred Pharmacy (with phone number or street name): [place], [place] - [address] [phone] (Phone) [phone] (Fax)

## 2017-10-05 ENCOUNTER — Ambulatory Visit (INDEPENDENT_AMBULATORY_CARE_PROVIDER_SITE_OTHER): Payer: Medicare Other | Admitting: Family Medicine

## 2017-10-05 ENCOUNTER — Encounter: Payer: Self-pay | Admitting: Family Medicine

## 2017-10-05 VITALS — BP 112/68 | HR 65 | Temp 98.0°F | Resp 16 | Ht 64.5 in | Wt 166.0 lb

## 2017-10-05 DIAGNOSIS — Z0000 Encounter for general adult medical examination without abnormal findings: Secondary | ICD-10-CM | POA: Diagnosis not present

## 2017-10-05 DIAGNOSIS — N183 Chronic kidney disease, stage 3 unspecified: Secondary | ICD-10-CM

## 2017-10-05 DIAGNOSIS — I10 Essential (primary) hypertension: Secondary | ICD-10-CM | POA: Diagnosis not present

## 2017-10-05 LAB — BASIC METABOLIC PANEL
BUN: 23 mg/dL (ref 6–23)
CO2: 31 mEq/L (ref 19–32)
Calcium: 9.3 mg/dL (ref 8.4–10.5)
Chloride: 101 mEq/L (ref 96–112)
Creatinine, Ser: 1.26 mg/dL (ref 0.40–1.50)
GFR: 56.38 mL/min — AB (ref >60.00)
GLUCOSE: 99 mg/dL (ref 70–99)
POTASSIUM: 4.3 meq/L (ref 3.5–5.1)
SODIUM: 138 meq/L (ref 135–145)

## 2017-10-05 NOTE — Progress Notes (Signed)
Office Note 10/05/2017  CC:  Chief Complaint  Patient presents with  . Annual Exam    Pt is not fasting.     HPI:  Brendan Gonzalez is a 84 y.o. White male who is here for annual health maintenance exam. Says he feels well.   Home bp's : 115-120s/60s-70s consistently.  Has been taking hctz, atenolol, and amlodipine but NOT enalapril anymore.  No exercise at all. He is sedentary. Diet: eats whatever I want to. Dental preventatives UTD. Eyes: frequent f/u for macular degeneration.  Past Medical History:  Diagnosis Date  . C2 cervical fracture ([place]) 07/2017   Type II morphology with posterior displacement--C collar (potentially indefinitely).  . Chronic lower back pain   . Chronic renal insufficiency, stage III (moderate) (HCC)    GFR 50s-60s  . Colon cancer ([place]) 1993  . CVA (cerebral vascular accident) ([place])    Left MCA territory.  Carotid dopplers ok, Brain MRA ok, ECHO with grd I DD.  [name] Hypertension   . Lumbar spinal stenosis    Surgery 2012 helped but in 2015 pain has returned  . Osteoarthritis   . Venous insufficiency of both lower extremities    made worse by amlodipine    Past Surgical History:  Procedure Laterality Date  . CHOLECYSTECTOMY  1984  . INGUINAL HERNIA REPAIR  1974   Bilat  . LUMBAR SPINE SURGERY  2012   for spinal stenosis (Dr. Jonelle Sports in [place])  . LUNG LOBECTOMY  1993   LLL per pt--worry of possible cancer but turned out to be benign.  [name] PARTIAL COLECTOMY  1993   for colon cancer    Family History  Problem Relation Age of Onset  . Parkinson's disease Mother   . Heart attack Mother   . Cancer Brother   . Cancer Daughter     Social History   Socioeconomic History  . Marital status: Married    Spouse name: Not on file  . Number of children: Not on file  . Years of education: Not on file  . Highest education level: Not on file  Occupational History  . Not on file  Social Needs  . Financial resource strain: Not on file  . Food  insecurity:    Worry: Not on file    Inability: Not on file  . Transportation needs:    Medical: Not on file    Non-medical: Not on file  Tobacco Use  . Smoking status: Former Research scientist (life sciences)  . Smokeless tobacco: Never Used  Substance and Sexual Activity  . Alcohol use: Yes    Comment: daily, gin every night  . Drug use: No  . Sexual activity: Not on file  Lifestyle  . Physical activity:    Days per week: Not on file    Minutes per session: Not on file  . Stress: Not on file  Relationships  . Social connections:    Talks on phone: Not on file    Gets together: Not on file    Attends religious service: Not on file    Active member of club or organization: Not on file    Attends meetings of clubs or organizations: Not on file    Relationship status: Not on file  . Intimate partner violence:    Fear of current or ex partner: Not on file    Emotionally abused: Not on file    Physically abused: Not on file    Forced sexual activity: Not on file  Other Topics  Concern  . Not on file  Social History Narrative   Married, 1 son and 1 daughter.   Education: BS in Pharmacologist.   Occupation: Pharmacologist (retired).   As of 01/2014, living at [facility] with his wife.   No T/A/Ds.      Outpatient Medications Prior to Visit  Medication Sig Dispense Refill  . amLODipine (NORVASC) 10 MG tablet Take 1 tablet (10 mg total) by mouth daily. 90 tablet 0  . atenolol (TENORMIN) 100 MG tablet Take 1 tablet (100 mg total) by mouth daily. 90 tablet 0  . atorvastatin (LIPITOR) 10 MG tablet TAKE ONE TABLET BY MOUTH ONCE DAILY 90 tablet 0  . cholecalciferol (VITAMIN D) 1000 UNITS tablet Take 1,000 Units by mouth daily.    . clopidogrel (PLAVIX) 75 MG tablet TAKE 1 TABLET BY MOUTH DAILY 90 tablet 1  . hydrochlorothiazide (MICROZIDE) 12.5 MG capsule Take 1 capsule (12.5 mg total) by mouth daily. 90 capsule 3  . HYDROcodone-acetaminophen (NORCO) 5-325 MG tablet Take 1 tablet by mouth every 6 (six) hours  as needed for severe pain. 60 tablet 0  . vitamin C (ASCORBIC ACID) 500 MG tablet Take 500 mg by mouth daily.    . vitamin E 400 UNIT capsule Take 400 Units by mouth every other day.    . enalapril (VASOTEC) 10 MG tablet TAKE ONE TABLET BY MOUTH TWICE DAILY. (Patient not taking: Reported on 10/05/2017) 180 tablet 1   No facility-administered medications prior to visit.     No Known Allergies  ROS Review of Systems  Constitutional: Negative for appetite change, chills, fatigue and fever.  HENT: Negative for congestion, dental problem, ear pain and sore throat.   Eyes: Negative for discharge, redness and visual disturbance.  Respiratory: Negative for cough, chest tightness, shortness of breath and wheezing.   Cardiovascular: Negative for chest pain, palpitations and leg swelling.  Gastrointestinal: Negative for abdominal pain, blood in stool, diarrhea, nausea and vomiting.  Genitourinary: Negative for difficulty urinating, dysuria, flank pain, frequency, hematuria and urgency.  Musculoskeletal: Negative for arthralgias, back pain, joint swelling, myalgias and neck stiffness.  Skin: Positive for rash (left wrist--psoriasis--better with vick's vaporub). Negative for pallor.  Neurological: Negative for dizziness, speech difficulty, weakness and headaches.  Hematological: Negative for adenopathy. Does not bruise/bleed easily.  Psychiatric/Behavioral: Negative for confusion and sleep disturbance. The patient is not nervous/anxious.     PE; Blood pressure 112/68, pulse 65, temperature 98 F (36.7 C), temperature source Oral, resp. rate 16, height 5' 4.5" (1.638 m), weight 166 lb (75.3 kg), SpO2 98 %. Body mass index is 28.05 kg/m.  Gen: Alert, well appearing.  Patient is oriented to person, place, time, and situation. AFFECT: pleasant, lucid thought and speech. ENT: Ears: EACs clear, normal epithelium.  TMs with good light reflex and landmarks bilaterally.  Eyes: no injection, icteris,  swelling, or exudate.  EOMI, PERRLA. Nose: no drainage or turbinate edema/swelling.  No injection or focal lesion.  Mouth: lips without lesion/swelling.  Oral mucosa pink and moist.  Dentition intact and without obvious caries or gingival swelling.  Oropharynx without erythema, exudate, or swelling.  Note examined due to pt having cervical collar on. CV: RRR, no m/r/g.   LUNGS: CTA bilat, nonlabored resps, good aeration in all lung fields. ABD: soft, NT, ND, BS normal.  No hepatospenomegaly or mass.  No bruits. EXT: no clubbing, cyanosis, or edema.  Musculoskeletal: no joint swelling, erythema, warmth, or tenderness.  ROM of all joints intact. Skin - no  sores or suspicious lesions or rashes or color changes   Pertinent labs:  Lab Results  Component Value Date   TSH 1.33 07/20/2017   Lab Results  Component Value Date   WBC 9.8 08/09/2017   HGB 13.0 08/09/2017   HCT 38.1 (L) 08/09/2017   MCV 91.8 08/09/2017   PLT 233 08/09/2017   Lab Results  Component Value Date   CREATININE 1.30 (H) 08/09/2017   BUN 22 08/09/2017   NA 137 08/09/2017   K 3.8 08/09/2017   CL 99 08/09/2017   CO2 30 08/09/2017   Lab Results  Component Value Date   ALT 20 08/09/2017   AST 20 08/09/2017   ALKPHOS 82 08/09/2017   BILITOT 0.8 08/09/2017   Lab Results  Component Value Date   CHOL 133 04/13/2017   Lab Results  Component Value Date   HDL 68 04/13/2017   Lab Results  Component Value Date   LDLCALC 49 04/13/2017   Lab Results  Component Value Date   TRIG 80 04/13/2017   Lab Results  Component Value Date   CHOLHDL 2.0 04/13/2017   Lab Results  Component Value Date   PSA 2.5 07/20/2017   Lab Results  Component Value Date   HGBA1C 5.2 03/15/2016    ASSESSMENT AND PLAN:   Health maintenance exam: Reviewed age and gender appropriate health maintenance issues (prudent diet, regular exercise, health risks of tobacco and excessive alcohol, use of seatbelts, fire alarms in home, use  of sunscreen).  Also reviewed age and gender appropriate health screening as well as vaccine recommendations. Vaccines: UTD.  Shingrix discussed--rx sent to pt's pharmacy. Labs: BMET today (HTN and CRI). Prostate ca screening: declines due to age. Colon ca screening: declines due to age.  HTN: his bp is great on current regimen-->don't restart enalapril.  An After Visit Summary was printed and given to the patient.  FOLLOW UP:  Return in about 6 months (around 04/06/2018) for routine chronic illness f/u.  Signed:  Crissie Sickles, MD           10/05/2017

## 2017-10-05 NOTE — Patient Instructions (Signed)

## 2017-10-09 ENCOUNTER — Ambulatory Visit
Admission: RE | Admit: 2017-10-09 | Discharge: 2017-10-09 | Disposition: A | Discharge status: Home / Self Care | Payer: Medicare Other | Source: Ambulatory Visit | Attending: Neurological Surgery | Admitting: Neurological Surgery

## 2017-10-09 DIAGNOSIS — S12110A Anterior displaced Type II dens fracture, initial encounter for closed fracture: Secondary | ICD-10-CM | POA: Diagnosis not present

## 2017-10-09 DIAGNOSIS — S12111A Posterior displaced Type II dens fracture, initial encounter for closed fracture: Secondary | ICD-10-CM

## 2017-10-10 ENCOUNTER — Other Ambulatory Visit: Payer: Self-pay | Admitting: Neurological Surgery

## 2017-10-10 DIAGNOSIS — I10 Essential (primary) hypertension: Secondary | ICD-10-CM | POA: Diagnosis not present

## 2017-10-10 DIAGNOSIS — S12111A Posterior displaced Type II dens fracture, initial encounter for closed fracture: Secondary | ICD-10-CM | POA: Diagnosis not present

## 2017-11-16 ENCOUNTER — Telehealth (INDEPENDENT_AMBULATORY_CARE_PROVIDER_SITE_OTHER): Payer: Self-pay | Admitting: *Deleted

## 2017-11-17 ENCOUNTER — Emergency Department (HOSPITAL_BASED_OUTPATIENT_CLINIC_OR_DEPARTMENT_OTHER)
Admission: EM | Admit: 2017-11-17 | Discharge: 2017-11-17 | Disposition: A | Discharge status: Home / Self Care | Payer: Medicare Other | Attending: Emergency Medicine | Admitting: Emergency Medicine

## 2017-11-17 ENCOUNTER — Encounter (HOSPITAL_BASED_OUTPATIENT_CLINIC_OR_DEPARTMENT_OTHER): Payer: Self-pay | Admitting: *Deleted

## 2017-11-17 ENCOUNTER — Emergency Department (HOSPITAL_BASED_OUTPATIENT_CLINIC_OR_DEPARTMENT_OTHER): Payer: Medicare Other

## 2017-11-17 ENCOUNTER — Other Ambulatory Visit: Payer: Self-pay

## 2017-11-17 DIAGNOSIS — Z8673 Personal history of transient ischemic attack (TIA), and cerebral infarction without residual deficits: Secondary | ICD-10-CM | POA: Diagnosis not present

## 2017-11-17 DIAGNOSIS — M25552 Pain in left hip: Secondary | ICD-10-CM

## 2017-11-17 DIAGNOSIS — Y92000 Kitchen of unspecified non-institutional (private) residence as  the place of occurrence of the external cause: Secondary | ICD-10-CM | POA: Insufficient documentation

## 2017-11-17 DIAGNOSIS — M899 Disorder of bone, unspecified: Secondary | ICD-10-CM | POA: Diagnosis not present

## 2017-11-17 DIAGNOSIS — N183 Chronic kidney disease, stage 3 (moderate): Secondary | ICD-10-CM | POA: Insufficient documentation

## 2017-11-17 DIAGNOSIS — Z9049 Acquired absence of other specified parts of digestive tract: Secondary | ICD-10-CM | POA: Insufficient documentation

## 2017-11-17 DIAGNOSIS — Z87891 Personal history of nicotine dependence: Secondary | ICD-10-CM | POA: Insufficient documentation

## 2017-11-17 DIAGNOSIS — Z7902 Long term (current) use of antithrombotics/antiplatelets: Secondary | ICD-10-CM | POA: Insufficient documentation

## 2017-11-17 DIAGNOSIS — M898X5 Other specified disorders of bone, thigh: Secondary | ICD-10-CM

## 2017-11-17 DIAGNOSIS — W01198A Fall on same level from slipping, tripping and stumbling with subsequent striking against other object, initial encounter: Secondary | ICD-10-CM | POA: Diagnosis not present

## 2017-11-17 DIAGNOSIS — I129 Hypertensive chronic kidney disease with stage 1 through stage 4 chronic kidney disease, or unspecified chronic kidney disease: Secondary | ICD-10-CM | POA: Insufficient documentation

## 2017-11-17 DIAGNOSIS — Z79899 Other long term (current) drug therapy: Secondary | ICD-10-CM | POA: Diagnosis not present

## 2017-11-17 DIAGNOSIS — Y9389 Activity, other specified: Secondary | ICD-10-CM | POA: Diagnosis not present

## 2017-11-17 DIAGNOSIS — Y998 Other external cause status: Secondary | ICD-10-CM | POA: Insufficient documentation

## 2017-11-17 DIAGNOSIS — M545 Low back pain: Secondary | ICD-10-CM | POA: Diagnosis not present

## 2017-11-17 DIAGNOSIS — S0990XA Unspecified injury of head, initial encounter: Secondary | ICD-10-CM | POA: Insufficient documentation

## 2017-11-17 DIAGNOSIS — S79912A Unspecified injury of left hip, initial encounter: Secondary | ICD-10-CM | POA: Diagnosis not present

## 2017-11-17 DIAGNOSIS — Z85038 Personal history of other malignant neoplasm of large intestine: Secondary | ICD-10-CM | POA: Diagnosis not present

## 2017-11-17 DIAGNOSIS — S199XXA Unspecified injury of neck, initial encounter: Secondary | ICD-10-CM | POA: Diagnosis not present

## 2017-11-17 MED ORDER — DICLOFENAC SODIUM 1 % TD GEL: 4.0000 g | Freq: Four times a day (QID) | TRANSDERMAL | 0 refills | Status: DC

## 2017-11-17 NOTE — ED Notes (Signed)
Family at bedside. 

## 2017-11-17 NOTE — ED Triage Notes (Signed)
Back pain with radiation down his left leg. He feels sure it is a ball and socket problem per pt.

## 2017-11-17 NOTE — ED Provider Notes (Signed)
[facility] EMERGENCY DEPARTMENT Provider Note   CSN: [id number] Arrival date & time: 11/17/17  1336     History   Chief Complaint Chief Complaint  Patient presents with  . Back Pain    HPI Brendan Gonzalez is a 84 y.o. male.  HPI  Patient is a 84 year old male with a history of CVA, colon cancer, chronic renal insufficiency, spinal stenosis, C2 cervical fracture and April 2019, and lytic lesions of bone unknown primary presenting for left hip pain.  Patient reports that he believes it is associated with falling 2 days ago.  Patient reports that at that time, he was in the kitchen and fell onto his left side.  His wife notes that she did note he had a bump on his left frontal scalp after this incident, and she feels that he hit his head in this incident.  He is unsure.  Patient denies any loss of consciousness in this incident.  At present, patient reports that he feels that he has a "ball-and-socket problem" in his left hip.  He reports that it hurts to bear weight on it.  Patient reports that he has been ambulating over the last 2 days but with pain.  Patient denies any loss of sensation distal to the injury, saddle anesthesia, loss of bowel or bladder control, or discoloration of his left lower extremity.  Patient has been taking hydrocodone-acetaminophen prescribed by his primary care provider for the pain.  Past Medical History:  Diagnosis Date  . C2 cervical fracture ([place]) 07/2017   Type II morphology with posterior displacement--C collar (potentially indefinitely).  . Chronic lower back pain   . Chronic renal insufficiency, stage III (moderate) (HCC)    GFR 50s-60s  . Colon cancer ([place]) 1993  . CVA (cerebral vascular accident) ([place])    Left MCA territory.  Carotid dopplers ok, Brain MRA ok, ECHO with grd I DD.  [name] Hypertension   . Lumbar spinal stenosis    Surgery 2012 helped but in 2015 pain has returned  . Osteoarthritis   . Venous insufficiency of both lower  extremities    made worse by amlodipine    Patient Active Problem List   Diagnosis Date Noted  . Cerebral embolism with cerebral infarction 03/15/2016  . Cryptogenic stroke ([place]) 03/15/2016  . Stroke-like symptoms 03/14/2016  . Slurred speech   . Essential hypertension   . Stage 3 chronic kidney disease ([place])   . Acute bronchitis 01/27/2014  . URI (upper respiratory infection) 01/27/2014    Past Surgical History:  Procedure Laterality Date  . CHOLECYSTECTOMY  1984  . INGUINAL HERNIA REPAIR  1974   [facility]  . LUMBAR SPINE SURGERY  2012   for spinal stenosis (Dr. Jonelle Sports in [place])  . LUNG LOBECTOMY  1993   LLL per pt--worry of possible cancer but turned out to be benign.  [name] PARTIAL COLECTOMY  1993   for colon cancer        Home Medications    Prior to Admission medications   Medication Sig Start Date End Date Taking? Authorizing Provider  amLODipine (NORVASC) 10 MG tablet Take 1 tablet (10 mg total) by mouth daily. 09/28/17   McGowen, Adrian Blackwater, MD  atenolol (TENORMIN) 100 MG tablet Take 1 tablet (100 mg total) by mouth daily. 09/28/17   McGowen, Adrian Blackwater, MD  atorvastatin (LIPITOR) 10 MG tablet TAKE ONE TABLET BY MOUTH ONCE DAILY 09/28/17   McGowen, Adrian Blackwater, MD  cholecalciferol (VITAMIN D) 1000 UNITS tablet  Take 1,000 Units by mouth daily.    [provider]  clopidogrel (PLAVIX) 75 MG tablet TAKE 1 TABLET BY MOUTH DAILY 05/19/17   McGowen, Adrian Blackwater, MD  hydrochlorothiazide (MICROZIDE) 12.5 MG capsule Take 1 capsule (12.5 mg total) by mouth daily. 11/09/16   McGowen, Adrian Blackwater, MD  HYDROcodone-acetaminophen (NORCO) 5-325 MG tablet Take 1 tablet by mouth every 6 (six) hours as needed for severe pain. 07/20/17   McGowen, Adrian Blackwater, MD  vitamin C (ASCORBIC ACID) 500 MG tablet Take 500 mg by mouth daily.    [provider]  vitamin E 400 UNIT capsule Take 400 Units by mouth every other day.    [provider]    Family History Family History  Problem Relation  Age of Onset  . Parkinson's disease Mother   . Heart attack Mother   . Cancer Brother   . Cancer Daughter     Social History Social History   Tobacco Use  . Smoking status: Former Research scientist (life sciences)  . Smokeless tobacco: Never Used  Substance Use Topics  . Alcohol use: Yes    Comment: daily, gin every night  . Drug use: No     Allergies   Patient has no known allergies.   Review of Systems Review of Systems  Constitutional: Negative for chills and fever.  HENT: Negative for congestion and sore throat.   Eyes: Negative for visual disturbance.  Respiratory: Negative for cough, chest tightness and shortness of breath.   Cardiovascular: Negative for chest pain and leg swelling.  Gastrointestinal: Negative for abdominal pain, nausea and vomiting.  Genitourinary: Negative for dysuria and flank pain.  Musculoskeletal: Positive for arthralgias and back pain. Negative for joint swelling and myalgias.  Skin: Negative for rash.  Neurological: Negative for dizziness, syncope, weakness, light-headedness, numbness and headaches.     Physical Exam Updated Vital Signs BP (!) 149/95 (BP Location: Left Arm)   Pulse 76   Temp (!) 97.5 F (36.4 C) (Oral)   Resp 18   Ht 5' 6" (1.676 m)   Wt 72.6 kg   SpO2 98%   BMI 25.82 kg/m   Physical Exam  Constitutional: He appears well-developed and well-nourished. No distress.  HENT:  Head: Normocephalic and atraumatic.  Mouth/Throat: Oropharynx is clear and moist.  Soft tissue, firm nodule of the left frontal scalp and supraorbital region.  Eyes: Pupils are equal, round, and reactive to light. Conjunctivae and EOM are normal.  Neck: Normal range of motion. Neck supple.  Cardiovascular: Normal rate, regular rhythm, S1 normal and S2 normal.  No murmur heard. Intact distal pulses, 2+ DP bilaterally.  Pulmonary/Chest: Effort normal and breath sounds normal. He has no wheezes. He has no rales.  Abdominal: Soft. He exhibits no distension. There is no  tenderness. There is no guarding.  Musculoskeletal:  No midline tenderness of cervical, thoracic, or lumbar spine. Left hip without erythema or edema overlying skin.  There is tenderness to palpation overlying the region of ischial bursa and sciatic notch.  Pain continues along the left posterior thigh.  Patient is able to perform passive range of motion without difficulty with FABER/FADIR maneuvers as well as axial loading.  Neurological: He is alert.  Cranial nerves grossly intact. Strength 5 out of 5 in bilateral lower extremities with hip flexion and extension, dorsiflexion and plantar flexion.  Skin: Skin is warm and dry. No rash noted. No erythema.  Psychiatric: He has a normal mood and affect. His behavior is normal. Judgment and thought  content normal.  Nursing note and vitals reviewed.    ED Treatments / Results  Labs (all labs ordered are listed, but only abnormal results are displayed) Labs Reviewed - No data to display  EKG None  Radiology Ct Head Wo Contrast  Result Date: 11/17/2017 CLINICAL DATA:  84 year old male with history of minor head trauma. EXAM: CT HEAD WITHOUT CONTRAST CT CERVICAL SPINE WITHOUT CONTRAST TECHNIQUE: Multidetector CT imaging of the head and cervical spine was performed following the standard protocol without intravenous contrast. Multiplanar CT image reconstructions of the cervical spine were also generated. COMPARISON:  Cervical spine CT 10/09/2017.  Head CT 07/15/2017. FINDINGS: CT HEAD FINDINGS Brain: Mild cerebral and cerebellar atrophy. Patchy and confluent areas of decreased attenuation are noted throughout the deep and periventricular white matter of the cerebral hemispheres bilaterally, compatible with chronic microvascular ischemic disease. No evidence of acute infarction, hemorrhage, hydrocephalus, extra-axial collection or mass lesion/mass effect. Vascular: No hyperdense vessel or unexpected calcification. Skull: Again noted are several lytic  lesions in the skull, which appear slightly increased in size, including a 1.4 x 2.1 cm lesion in the high right parietal region (axial image 70 of series 3), as well as 14 x 18 mm and 12 x 15 mm left frontal lesions (axial images 68 and 67 of series 3 respectively). In addition, there are at least 2 new lesions. One of these is smoothly marginated in the left parietal region (axial image 76 of series 3) measuring 8 x 10 mm, while the other is in the left supraorbital region (axial image 48 of series 3) where there is a full-thickness lytic lesion with overlying soft tissue measuring 16 x 28 mm. Sinuses/Orbits: No acute finding. Other: None. CT CERVICAL SPINE FINDINGS Alignment: 3 mm of anterolisthesis of C4 upon C5. While there is 4 mm of distraction of the abdomen toilet fracture (discussed below), this results in approximately 2 mm of posterior displacement of C1 and tip of odontoid process relative to C2. Skull base and vertebrae: Mildly displaced slightly obliquely oriented type 2 odontoid fracture with approximately 4 mm of distraction. No other acute displaced fractures are identified. Soft tissues and spinal canal: No prevertebral fluid or swelling. No visible canal hematoma. Disc levels: Severe multilevel degenerative disc disease, most pronounced at C5-C6 and C6-C7. At C5-C6 there is complete bony fusion. Severe multilevel facet arthropathy. Upper chest: Unremarkable. Other: None. IMPRESSION: 1. Increased number and size of lytic lesions throughout the skull, highly concerning for progressive metastatic disease to the bones. Further clinical evaluation is recommended. 2. No acute intracranial abnormalities. 3. Obliquely oriented mildly displaced type 2 odontoid fracture, similar to the prior examination. No new signs of additional acute traumatic injury to the cervical spine are noted on today's examination. 4. Cerebral atrophy with chronic microvascular ischemic changes in the cerebral white matter, as  above. Electronically Signed   By: Vinnie Langton M.D.   On: 11/17/2017 17:43   Ct Cervical Spine Wo Contrast  Result Date: 11/17/2017 CLINICAL DATA:  84 year old male with history of minor head trauma. EXAM: CT HEAD WITHOUT CONTRAST CT CERVICAL SPINE WITHOUT CONTRAST TECHNIQUE: Multidetector CT imaging of the head and cervical spine was performed following the standard protocol without intravenous contrast. Multiplanar CT image reconstructions of the cervical spine were also generated. COMPARISON:  Cervical spine CT 10/09/2017.  Head CT 07/15/2017. FINDINGS: CT HEAD FINDINGS Brain: Mild cerebral and cerebellar atrophy. Patchy and confluent areas of decreased attenuation are noted throughout the deep and periventricular white matter of  the cerebral hemispheres bilaterally, compatible with chronic microvascular ischemic disease. No evidence of acute infarction, hemorrhage, hydrocephalus, extra-axial collection or mass lesion/mass effect. Vascular: No hyperdense vessel or unexpected calcification. Skull: Again noted are several lytic lesions in the skull, which appear slightly increased in size, including a 1.4 x 2.1 cm lesion in the high right parietal region (axial image 70 of series 3), as well as 14 x 18 mm and 12 x 15 mm left frontal lesions (axial images 68 and 67 of series 3 respectively). In addition, there are at least 2 new lesions. One of these is smoothly marginated in the left parietal region (axial image 76 of series 3) measuring 8 x 10 mm, while the other is in the left supraorbital region (axial image 48 of series 3) where there is a full-thickness lytic lesion with overlying soft tissue measuring 16 x 28 mm. Sinuses/Orbits: No acute finding. Other: None. CT CERVICAL SPINE FINDINGS Alignment: 3 mm of anterolisthesis of C4 upon C5. While there is 4 mm of distraction of the abdomen toilet fracture (discussed below), this results in approximately 2 mm of posterior displacement of C1 and tip of  odontoid process relative to C2. Skull base and vertebrae: Mildly displaced slightly obliquely oriented type 2 odontoid fracture with approximately 4 mm of distraction. No other acute displaced fractures are identified. Soft tissues and spinal canal: No prevertebral fluid or swelling. No visible canal hematoma. Disc levels: Severe multilevel degenerative disc disease, most pronounced at C5-C6 and C6-C7. At C5-C6 there is complete bony fusion. Severe multilevel facet arthropathy. Upper chest: Unremarkable. Other: None. IMPRESSION: 1. Increased number and size of lytic lesions throughout the skull, highly concerning for progressive metastatic disease to the bones. Further clinical evaluation is recommended. 2. No acute intracranial abnormalities. 3. Obliquely oriented mildly displaced type 2 odontoid fracture, similar to the prior examination. No new signs of additional acute traumatic injury to the cervical spine are noted on today's examination. 4. Cerebral atrophy with chronic microvascular ischemic changes in the cerebral white matter, as above. Electronically Signed   By: Vinnie Langton M.D.   On: 11/17/2017 17:43   Ct Hip Left Wo Contrast  Result Date: 11/17/2017 CLINICAL DATA:  Left-sided back and leg pain and hip pain. EXAM: CT OF THE LEFT HIP WITHOUT CONTRAST TECHNIQUE: Multidetector CT imaging of the left hip was performed according to the standard protocol. Multiplanar CT image reconstructions were also generated. COMPARISON:  None. FINDINGS: The left sacrum is destroyed by tumor. There is also a lytic lesion involving the inferior pubic ramus with a small pathologic fracture. No hip fracture or bone lesion. Stable benign bone lesion in the intertrochanteric region containing fat. The surrounding hip and pelvic musculature are grossly normal for age. Advanced fatty atrophy of the gluteus minimus muscle and scattered fatty atrophy of the other muscles. No significant intrapelvic abnormalities are  identified. The prostate gland is enlarged and there is mild distention of the bladder. IMPRESSION: 1. The left sacrum is destroyed by tumor. There is also a lytic destructive lesion involving the left inferior pubic ramus. This is likely metastatic disease. 2. No hip fracture or metastatic lesions involving the hip. Electronically Signed   By: Marijo Sanes M.D.   On: 11/17/2017 17:45   Dg Hip Unilat W Or Wo Pelvis 2-3 Views Left  Result Date: 11/17/2017 CLINICAL DATA:  84 year-old male c/o chronic LEFT hip pain that worsened when he fell and landed in a seated position recently. C/O LEFT ischial pain  w/ radiation down left leg. EXAM: DG HIP (WITH OR WITHOUT PELVIS) 2-3V LEFT COMPARISON:  None. FINDINGS: No fracture.  No bone lesion. Hip joints are normally aligned. There is superolateral joint space narrowing of the right hip. Left hip joint is well maintained. SI joints and symphysis pubis are normally aligned. Bones are diffusely demineralized. There are dense arterial vascular calcifications. Soft tissues are otherwise unremarkable. Marginal IMPRESSION: 1. No fracture or dislocation.  No acute finding. Electronically Signed   By: Lajean Manes M.D.   On: 11/17/2017 14:35    Procedures Procedures (including critical care time)  Medications Ordered in ED Medications - No data to display   Initial Impression / Assessment and Plan / ED Course  I have reviewed the triage vital signs and the nursing notes.  Pertinent labs & imaging results that were available during my care of the patient were reviewed by me and considered in my medical decision making (see chart for details).  Clinical Course as of Nov 17 2112  Fri Nov 17, 2017  1644 Patient declines anything for pain at this time.  Patient has Salon PAS patch on his back.   [AM]    Clinical Course User Index [AM] Albesa Seen, PA-C    Patient nontoxic-appearing and neurologically intact in lower extremities.  Different diagnosis  includes pain due to acute traumatic injury, previously lytic lesions, or referred pain from his previous spinal stenosis.  Evaluation with CT scans today reveals chronic degenerative disease as well as prior laminectomy of the lumbar spine.  Additionally, patient has destructive bony lesions of the left sacrum, appear to be new since bone scan in April 2019.  Patient has interim progression of lytic lesions of the skull, as well as stable and unchanged odontoid fracture from April 2019.   I discussed the CT results with patient and his wife at bedside, as well as elicited their prior knowledge of these lytic lesions and goals of care.  I suspect that the lytic lesions of the sacrum are causing patient's pain at this time.  Per their discussion, they consulted with oncology in April 2019, who stated that he had lytic lesions of unknown origin, unrevealing work-up for multiple myeloma, but if cancer was uncovered, would likely be not treated due to the patient's age.  They were in agreement with this, and it was in line with their goals of care.  I will refer back to Dr. Marin Olp, as it appears that patient has possible pathologic fracture within the area of the sacrum.  Additionally, instructed patient to obtain follow-up with primary care provider.  Patient is currently taking hydrocodone-acetaminophen, prescribed him by his primary care provider for pain.  This appears to be controlling his pain, and he has many pills left.  I discussed with the patient and his wife safe use of this medication, as well as dosage limitations and avoiding the use of extra Tylenol.  Return precautions were given to the patient and his wife regarding any weakness or numbness in the lower extremities, numbness in groin, loss of bowel or bladder control, progressive pain, or further falls.  Patient and his wife are in understanding and agree with the plan of care.  This is a shared visit with Dr. Blanchie Dessert. Patient was  independently evaluated by this attending physician. Attending physician consulted in evaluation and discharge management.  Final Clinical Impressions(s) / ED Diagnoses   Final diagnoses:  Lytic bone lesion of hip  Left hip pain  ED Discharge Orders         Ordered    diclofenac sodium (VOLTAREN) 1 % GEL  4 times daily     11/17/17 1935           Tamala Julian 11/17/17 2118    Blanchie Dessert, MD 11/18/17 [phone]

## 2017-11-17 NOTE — ED Notes (Signed)
Pt in CT.

## 2017-11-17 NOTE — Discharge Instructions (Signed)
Please see the information and instructions below regarding your visit.  Your diagnoses today include:  1. Lytic bone lesion of hip   2. Left hip pain     Tests performed today include: See side panel of your discharge paperwork for testing performed today. Vital signs are listed at the bottom of these instructions.   The scan of your hip today showed lesions in the bone that look like they may be cancer. This is similar to the spots we saw in April.  It looks like you were worked up for a primary blood cancer that causes these lesions, however they are unsure what could be causing this at this time.  Medications prescribed:    Take any prescribed medications only as prescribed, and any over the counter medications only as directed on the packaging.  You may take the hydrocodone was prescribed by her primary care provider.  Please ensure however that you are not combining this with Tylenol, as I looked up the medication, and it has Tylenol in it.  At maximum, you may take 1 pill of the hydrocodone-acetaminophen every 4 hours as needed for pain.  I also prescribed Voltaren gel, sent to your pharmacy.  This is an anti-inflammatory gel that you can put over the hip to help with the pain in addition to the [place] care instructions:  Please follow any educational materials contained in this packet.   Follow-up instructions: Please follow-up with your primary care provider in 3 days for follow-up.  I will send Dr. Anitra Lauth a note.  Please also call Dr. Antonieta Pert office on Monday morning in order to make an appointment.  You may need additional therapy to help with your pain.   Return instructions:  Please return to the Emergency Department if you experience worsening symptoms.  Please return to the emergency department if you have any more falls with injury, worsening pain in your hip, weakness or numbness in your lower extremities. Please return if you have any other emergent  concerns.  Additional Information:   Your vital signs today were: BP 130/75 (BP Location: Right Arm)    Pulse 74    Temp (!) 97.5 F (36.4 C) (Oral)    Resp 16    Ht 5\' 6"  (1.676 m)    Wt 72.6 kg    SpO2 98%    BMI 25.82 kg/m  If your blood pressure (BP) was elevated on multiple readings during this visit above 130 for the top number or above 80 for the bottom number, please have this repeated by your primary care provider within one month. --------------  Thank you for allowing [place] to participate in your care today.

## 2017-11-17 NOTE — ED Notes (Signed)
ED Provider at bedside. 

## 2017-11-20 ENCOUNTER — Telehealth: Payer: Self-pay | Admitting: Family Medicine

## 2017-11-20 NOTE — Telephone Encounter (Signed)
Okay for evaluation and possible hip injection.

## 2017-11-20 NOTE — Telephone Encounter (Signed)
Copied from [place] [id number]. Topic: Quick Communication - See Telephone Encounter >> Nov 20, 2017  7:39 AM Gardiner Ramus wrote: CRM for notification. See Telephone encounter for: 11/20/17. Pt wife called and stated that pt is in a lot of back pain and would like be worked in as soon as possible. First available appointment is 11/29/17. Pt would like to be worked in before.

## 2017-11-20 NOTE — Telephone Encounter (Signed)
Contacted patient.  He states his pain has been going on "forever"  He was seen in the ED 11/17/17 for pain and was put on a pain medication but patient is not sure how long he is to be on this medication which is why he wants appointment to discuss w/ Dr Anitra Lauth.  Patient also states he had a CT and would like to know if Dr Anitra Lauth can see results.   Please advise if you can work patient in any soon thatn 11/29/17.  Thanks!

## 2017-11-21 NOTE — Telephone Encounter (Signed)
Apt made for tomorrow (11/22/17) at 2:15pm.   Pt advised and voiced understanding.    Pt stated that he does not need more pain medication at this time. He is "handling it well".

## 2017-11-21 NOTE — Telephone Encounter (Signed)
Can you see if we can over-ride my schedule to get him in a 30 min spot this week? Tell him I can take a look at his CT scan and ask him if he wants me to send in a stronger pain med for him.-thx

## 2017-11-22 ENCOUNTER — Ambulatory Visit: Payer: Medicare Other | Admitting: Family Medicine

## 2017-11-22 ENCOUNTER — Encounter: Payer: Self-pay | Admitting: Family Medicine

## 2017-11-22 VITALS — BP 121/73 | HR 62 | Temp 98.0°F | Resp 16 | Ht 64.5 in | Wt 165.5 lb

## 2017-11-22 DIAGNOSIS — M25552 Pain in left hip: Secondary | ICD-10-CM | POA: Diagnosis not present

## 2017-11-22 DIAGNOSIS — M899 Disorder of bone, unspecified: Secondary | ICD-10-CM

## 2017-11-22 DIAGNOSIS — C7951 Secondary malignant neoplasm of bone: Secondary | ICD-10-CM

## 2017-11-22 DIAGNOSIS — M5442 Lumbago with sciatica, left side: Secondary | ICD-10-CM | POA: Diagnosis not present

## 2017-11-22 DIAGNOSIS — G8929 Other chronic pain: Secondary | ICD-10-CM

## 2017-11-22 MED ORDER — OXYCODONE HCL 5 MG PO TABS: ORAL_TABLET | ORAL | 0 refills | Status: DC

## 2017-11-22 NOTE — Progress Notes (Signed)
OFFICE VISIT  11/22/2017   CC:  Chief Complaint  Patient presents with  . Back Pain  . Hip Pain    HPI:    Patient is a 84 y.o. Caucasian male who presents for f/u recent ED visit for a fall onto L hip. Has chronic LBP and chronic L hip pain.  No hip fracture on imaging. Extensive CT imaging done at that ED visit picked up worsening of known lytic bone lesions in skull and NEW left sacrum tumor involvement and left inferior pubic ramus destructive lytic lesion. Has seen Dr. Marin Olp 08/09/2017, at which time it was felt that his bone lesions were likely not multiple myeloma, and if they were cancer at all then no curative treatment would be recommended/pursued. He ordered CT of C/A/P but pt has not gotten these done, nor does pt have a f/u appt arranged.   Not taking atorva or hctz at this time.  Pt not eating or drinking very well due to pain. No n/v. Takes 1 5/325 vicodin every 4 hours and this helps minimally. No constipation. No saddle anesthesia, no LE focal weakness, no loss of bowel/bladder control.  CT head and C spine w/out contrast 11/17/17: IMPRESSION: 1. Increased number and size of lytic lesions throughout the skull, highly concerning for progressive metastatic disease to the bones. Further clinical evaluation is recommended. 2. No acute intracranial abnormalities. 3. Obliquely oriented mildly displaced type 2 odontoid fracture, similar to the prior examination. No new signs of additional acute traumatic injury to the cervical spine are noted on today's examination. 4. Cerebral atrophy with chronic microvascular ischemic changes in the cerebral white matter, as above.  CT lumbar spine w/out contrast 11/17/17: IMPRESSION: 1. Progressive multilevel spondylosis of the lumbar spine as described. 2. Scoliosis. 3. Endplate sclerotic changes. Degenerative without evidence for metastatic disease to the spine.  CT left hip w/out contrast 11/17/17: IMPRESSION: 1. 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 There is also a lytic destructive lesion involving the left inferior pubic ramus. This is likely metastatic disease. 2. No hip fracture or metastatic lesions involving the hip  Past Medical History:  Diagnosis Date  . C2 cervical fracture ([place]) 07/2017   Type II morphology with posterior displacement--C collar (potentially indefinitely).  . Chronic lower back pain   . Chronic renal insufficiency, stage III (moderate) (HCC)    GFR 50s-60s  . Colon cancer ([place]) 1993  . CVA (cerebral vascular accident) ([place])    Left MCA territory.  Carotid dopplers ok, Brain MRA ok, ECHO with grd I DD.  [name] Hypertension   . Lumbar spinal stenosis    Surgery 2012 helped but in 2015 pain has returned  . Lytic bone lesions on xray 08/2017   Skull, mildly abnormal bone scan. SPEP/UPEP w/out monoclonal spike.  Dr. Marin Olp eval 08/2017: plan CT C/A/P but not done as of 11/22/17.  ED visit s/p fall 11/17/17-->multiple lytic lesions now, L sacrum destroyed by tumor, lytic lesion in pelvis.    . Osteoarthritis   . Venous insufficiency of both lower extremities    made worse by amlodipine    Past Surgical History:  Procedure Laterality Date  . CHOLECYSTECTOMY  1984  . INGUINAL HERNIA REPAIR  1974   Bilat  . LUMBAR SPINE SURGERY  2012   for spinal stenosis (Dr. Jonelle Sports in [place])  . LUNG LOBECTOMY  1993   LLL per pt--worry of possible cancer but turned out to be benign.  [name] [place]  for colon cancer    Outpatient Medications Prior to Visit  Medication Sig Dispense Refill  . amLODipine (NORVASC) 10 MG tablet Take 1 tablet (10 mg total) by mouth daily. 90 tablet 0  . atenolol (TENORMIN) 100 MG tablet Take 1 tablet (100 mg total) by mouth daily. 90 tablet 0  . cholecalciferol (VITAMIN D) 1000 UNITS tablet Take 1,000 Units by mouth daily.    . clopidogrel (PLAVIX) 75 MG tablet TAKE 1 TABLET BY MOUTH DAILY 90 tablet 1  . vitamin C (ASCORBIC ACID) 500 MG tablet Take 500 mg  by mouth daily.    . vitamin E 400 UNIT capsule Take 400 Units by mouth every other day.    [name] HYDROcodone-acetaminophen (NORCO) 5-325 MG tablet Take 1 tablet by mouth every 6 (six) hours as needed for severe pain. 60 tablet 0  . atorvastatin (LIPITOR) 10 MG tablet TAKE ONE TABLET BY MOUTH ONCE DAILY (Patient not taking: Reported on 11/22/2017) 90 tablet 0  . diclofenac sodium (VOLTAREN) 1 % GEL Apply 4 g topically 4 (four) times daily. (Patient not taking: Reported on 11/22/2017) 100 g 0  . hydrochlorothiazide (MICROZIDE) 12.5 MG capsule Take 1 capsule (12.5 mg total) by mouth daily. (Patient not taking: Reported on 11/22/2017) 90 capsule 3   No facility-administered medications prior to visit.     No Known Allergies  ROS As per HPI  PE: Blood pressure 121/73, pulse 62, temperature 98 F (36.7 C), temperature source Oral, resp. rate 16, height 5' 4.5" (1.638 m), weight 165 lb 8 oz (75.1 kg), SpO2 97 %. Body mass index is 27.97 kg/m.  Gen: Alert, well appearing.  Patient is oriented to person, place, time, and situation. AFFECT: pleasant, lucid thought and speech. CV: RRR, no m/r/g.   LUNGS: CTA bilat, nonlabored resps, good aeration in all lung fields. ABD: soft, NT/ND LB and hips: very minimal TTP in L ischial tuberosity region, otherwise he is nontender. LE strength 5/5 prox/dist bilat.    LABS:    Chemistry      Component Value Date/Time   NA 138 10/05/2017 1029   K 4.3 10/05/2017 1029   CL 101 10/05/2017 1029   CO2 31 10/05/2017 1029   BUN 23 10/05/2017 1029   CREATININE 1.26 10/05/2017 1029   CREATININE 1.30 (H) 08/09/2017 1533   CREATININE 1.22 (H) 08/03/2017 1518      Component Value Date/Time   CALCIUM 9.3 10/05/2017 1029   ALKPHOS 82 08/09/2017 1533   AST 20 08/09/2017 1533   ALT 20 08/09/2017 1533   BILITOT 0.8 08/09/2017 1533     Lab Results  Component Value Date   WBC 9.8 08/09/2017   HGB 13.0 08/09/2017   HCT 38.1 (L) 08/09/2017   MCV 91.8 08/09/2017    PLT 233 08/09/2017     IMPRESSION AND PLAN:  No problem-specific Assessment & Plan notes found for this encounter. Get pt appt ASAP with Dr. Marin Olp to discuss palliative tx options (radiation?  Bisphosphonate?) and see if he still recommends getting the CT of C/A/P. CBC and CMET today-->lab unable to get blood on him today so he'll return when better hydrated. Change from hydroc/apap to oxycodone 52m, 1-2 tabs qid prn, #84. Therapeutic expectations and side effect profile of medication discussed today.  Patient's questions answered.  An After Visit Summary was printed and given to the patient.  FOLLOW UP: Return in about 2 weeks (around 12/06/2017) for f/u pain (30 min). I think his wife wants to discuss his  frequent, mildly loose bms at that time as well.  Signed:  Crissie Sickles, MD           11/22/2017

## 2017-11-23 ENCOUNTER — Other Ambulatory Visit (INDEPENDENT_AMBULATORY_CARE_PROVIDER_SITE_OTHER): Payer: Medicare Other

## 2017-11-23 ENCOUNTER — Ambulatory Visit: Payer: Medicare Other | Admitting: Hematology & Oncology

## 2017-11-23 DIAGNOSIS — C7951 Secondary malignant neoplasm of bone: Secondary | ICD-10-CM | POA: Diagnosis not present

## 2017-11-23 DIAGNOSIS — M899 Disorder of bone, unspecified: Secondary | ICD-10-CM | POA: Diagnosis not present

## 2017-11-23 LAB — CBC WITH DIFFERENTIAL/PLATELET
Basophils Absolute: 0.1 10*3/uL (ref 0.0–0.1)
Basophils Relative: 0.7 % (ref 0.0–3.0)
Eosinophils Absolute: 0.3 10*3/uL (ref 0.0–0.7)
Eosinophils Relative: 2.3 % (ref 0.0–5.0)
HCT: 38.3 % — ABNORMAL LOW (ref 39.0–52.0)
Hemoglobin: 12.8 g/dL — ABNORMAL LOW (ref 13.0–17.0)
LYMPHS ABS: 1.1 10*3/uL (ref 0.7–4.0)
Lymphocytes Relative: 8 % — ABNORMAL LOW (ref 12.0–46.0)
MCHC: 33.3 g/dL (ref 30.0–36.0)
MCV: 92.7 fl (ref 78.0–100.0)
MONOS PCT: 11.3 % (ref 3.0–12.0)
Monocytes Absolute: 1.5 10*3/uL — ABNORMAL HIGH (ref 0.1–1.0)
NEUTROS ABS: 10.5 10*3/uL — AB (ref 1.4–7.7)
NEUTROS PCT: 77.7 % — AB (ref 43.0–77.0)
Platelets: 256 10*3/uL (ref 150.0–400.0)
RBC: 4.14 Mil/uL — AB (ref 4.22–5.81)
RDW: 14.9 % (ref 11.5–15.5)
WBC: 13.5 10*3/uL — ABNORMAL HIGH (ref 4.0–10.5)

## 2017-11-23 LAB — COMPREHENSIVE METABOLIC PANEL
ALT: 26 U/L (ref 0–53)
AST: 24 U/L (ref 0–37)
Albumin: 4.1 g/dL (ref 3.5–5.2)
Alkaline Phosphatase: 138 U/L — ABNORMAL HIGH (ref 39–117)
BUN: 20 mg/dL (ref 6–23)
CO2: 30 mEq/L (ref 19–32)
Calcium: 10.1 mg/dL (ref 8.4–10.5)
Chloride: 94 mEq/L — ABNORMAL LOW (ref 96–112)
Creatinine, Ser: 1.22 mg/dL (ref 0.40–1.50)
GFR: 58.5 mL/min — AB (ref >60.00)
GLUCOSE: 83 mg/dL (ref 70–99)
POTASSIUM: 4.5 meq/L (ref 3.5–5.1)
Sodium: 132 mEq/L — ABNORMAL LOW (ref 135–145)
Total Bilirubin: 1.2 mg/dL (ref 0.2–1.2)
Total Protein: 6.8 g/dL (ref 6.0–8.3)

## 2017-11-27 ENCOUNTER — Inpatient Hospital Stay: Payer: Medicare Other | Attending: Hematology & Oncology | Admitting: Hematology & Oncology

## 2017-11-27 ENCOUNTER — Other Ambulatory Visit: Payer: Self-pay

## 2017-11-27 ENCOUNTER — Encounter: Payer: Self-pay | Admitting: Hematology & Oncology

## 2017-11-27 ENCOUNTER — Inpatient Hospital Stay: Payer: Medicare Other

## 2017-11-27 VITALS — BP 131/63 | HR 63 | Temp 98.0°F | Wt 163.0 lb

## 2017-11-27 DIAGNOSIS — C801 Malignant (primary) neoplasm, unspecified: Secondary | ICD-10-CM | POA: Diagnosis not present

## 2017-11-27 DIAGNOSIS — M25552 Pain in left hip: Secondary | ICD-10-CM | POA: Insufficient documentation

## 2017-11-27 DIAGNOSIS — R937 Abnormal findings on diagnostic imaging of other parts of musculoskeletal system: Secondary | ICD-10-CM | POA: Diagnosis not present

## 2017-11-27 DIAGNOSIS — C7951 Secondary malignant neoplasm of bone: Secondary | ICD-10-CM | POA: Diagnosis not present

## 2017-11-27 DIAGNOSIS — Z79899 Other long term (current) drug therapy: Secondary | ICD-10-CM | POA: Insufficient documentation

## 2017-11-27 NOTE — Progress Notes (Signed)
Hematology and Oncology Follow Up Visit  Brendan Gonzalez [id number] 12/22/1919 84 y.o. 11/27/2017   Principle Diagnosis:   Lytic left iliac bone lesion-primary unknown  Current Therapy:    Observation     Interim History:  Brendan Gonzalez is back for a long way to visit.  We first saw him back in early May.  At that time, we had set him up with scans so we can see what was going on.  However, the scans were never done.  He went to his family doctor and was having a lot of pain in the left hip.  Dr. Anitra Lauth, being incredibly thorough, ordered a CT scan.  The CT scan showed a large lytic lesion in the left sacrum.  There was also a lytic lesion in the left pubic ramus.  When I first saw him, his PSA was normal at 2.8.  He had a normal CEA.  He is getting around with a cane.  He takes oxycodone which helps the pain.  He has not lost weight.  He has had no other symptoms.  He has had no change in bowel or bladder habits.  He has had no leg weakness.  When we first saw him, we did an SPEP.  This was also negative.  I just not sure what the primary would be.  Regardless, he clearly needs treatment for this.  Thankfully, he comes in with a son.  We had a great time talking about the "old days".  His appetite is good.  He has had no cough or shortness of breath.  Overall, his performance status is ECOG 2-3.  Medications:  Current Outpatient Medications:  .  amLODipine (NORVASC) 10 MG tablet, Take 1 tablet (10 mg total) by mouth daily., Disp: 90 tablet, Rfl: 0 .  atenolol (TENORMIN) 100 MG tablet, Take 1 tablet (100 mg total) by mouth daily., Disp: 90 tablet, Rfl: 0 .  cholecalciferol (VITAMIN D) 1000 UNITS tablet, Take 1,000 Units by mouth daily., Disp: , Rfl:  .  clopidogrel (PLAVIX) 75 MG tablet, TAKE 1 TABLET BY MOUTH DAILY, Disp: 90 tablet, Rfl: 1 .  oxyCODONE (OXY IR/ROXICODONE) 5 MG immediate release tablet, 1-2 tabs po qid prn pain, Disp: 84 tablet, Rfl: 0 .  vitamin C (ASCORBIC  ACID) 500 MG tablet, Take 500 mg by mouth daily., Disp: , Rfl:  .  vitamin E 400 UNIT capsule, Take 400 Units by mouth every other day., Disp: , Rfl:  .  atorvastatin (LIPITOR) 10 MG tablet, TAKE ONE TABLET BY MOUTH ONCE DAILY (Patient not taking: Reported on 11/22/2017), Disp: 90 tablet, Rfl: 0 .  diclofenac sodium (VOLTAREN) 1 % GEL, Apply 4 g topically 4 (four) times daily. (Patient not taking: Reported on 11/22/2017), Disp: 100 g, Rfl: 0 .  hydrochlorothiazide (MICROZIDE) 12.5 MG capsule, Take 1 capsule (12.5 mg total) by mouth daily. (Patient not taking: Reported on 11/22/2017), Disp: 90 capsule, Rfl: 3  Allergies: No Known Allergies  Past Medical History, Surgical history, Social history, and Family History were reviewed and updated.  Review of Systems: Review of Systems  Constitutional: Negative.   HENT:  Negative.   Eyes: Negative.   Respiratory: Negative.   Cardiovascular: Negative.   Gastrointestinal: Negative.   Endocrine: Negative.   Genitourinary: Negative.    Musculoskeletal: Positive for back pain and myalgias.  Skin: Negative.   Neurological: Negative.   Hematological: Negative.   Psychiatric/Behavioral: Negative.     Physical Exam:  weight is 163 lb (73.9 kg).  His oral temperature is 98 F (36.7 C). His blood pressure is 131/63 and his pulse is 63. His oxygen saturation is 99%.   Wt Readings from Last 3 Encounters:  11/27/17 163 lb (73.9 kg)  11/22/17 165 lb 8 oz (75.1 kg)  11/17/17 160 lb (72.6 kg)    Physical Exam  Constitutional: He is oriented to person, place, and time.  HENT:  Head: Normocephalic and atraumatic.  Mouth/Throat: Oropharynx is clear and moist.  Eyes: Pupils are equal, round, and reactive to light. EOM are normal.  Neck: Normal range of motion.  Cardiovascular: Normal rate, regular rhythm and normal heart sounds.  Pulmonary/Chest: Effort normal and breath sounds normal.  Abdominal: Soft. Bowel sounds are normal.  Musculoskeletal: Normal  range of motion. He exhibits no edema, tenderness or deformity.  Lymphadenopathy:    He has no cervical adenopathy.  Neurological: He is alert and oriented to person, place, and time.  Skin: Skin is warm and dry. No rash noted. No erythema.  Psychiatric: He has a normal mood and affect. His behavior is normal. Judgment and thought content normal.  Vitals reviewed.    Lab Results  Component Value Date   WBC 13.5 (H) 11/23/2017   HGB 12.8 (L) 11/23/2017   HCT 38.3 (L) 11/23/2017   MCV 92.7 11/23/2017   PLT 256.0 11/23/2017     Chemistry      Component Value Date/Time   NA 132 (L) 11/23/2017 0915   K 4.5 11/23/2017 0915   CL 94 (L) 11/23/2017 0915   CO2 30 11/23/2017 0915   BUN 20 11/23/2017 0915   CREATININE 1.22 11/23/2017 0915   CREATININE 1.30 (H) 08/09/2017 1533   CREATININE 1.22 (H) 08/03/2017 1518      Component Value Date/Time   CALCIUM 10.1 11/23/2017 0915   ALKPHOS 138 (H) 11/23/2017 0915   AST 24 11/23/2017 0915   AST 20 08/09/2017 1533   ALT 26 11/23/2017 0915   ALT 20 08/09/2017 1533   BILITOT 1.2 11/23/2017 0915   BILITOT 0.8 08/09/2017 1533      Impression and Plan: Brendan Gonzalez is a 84 year old white male who has a large destructive lesion in the left sacrum.  Again, I have no idea with her primary would be.  We will get a biopsy.  Thankfully, his son is there to be able to make sure that everything is done.  He does need to have radiation therapy given.  I have spoken to Dr. Sondra Come of radiation oncology who will see the patient after his biopsy is done.  Thankfully he has not been given the Plavix for 2 weeks.  As such, we did a biopsy this week.  I am just puzzled as to what the primary would be.  Maybe this is a lymphoma.  Again if this so, this would be highly treatable..  I would not think this is myeloma given the normal SPEP although light chain disease or non-secretory myeloma can do this.  We will find out what the results of the biopsy.  He  will need to have Xgeva.  Would like to see him back in 3 weeks.  By then, he will be into his radiation treatments and we will give him the [place].  I spent about 45 minutes with he and his family.  All the time was spent face-to-face.  I have coordinate care and talk to him about what the possibilities might be and try to go over some of the effects of radiation  therapy.   Volanda Napoleon, MD 8/19/20191:38 PM

## 2017-11-28 ENCOUNTER — Encounter: Payer: Self-pay | Admitting: Radiation Oncology

## 2017-11-30 ENCOUNTER — Encounter

## 2017-11-30 ENCOUNTER — Ambulatory Visit (INDEPENDENT_AMBULATORY_CARE_PROVIDER_SITE_OTHER): Payer: Self-pay | Admitting: Physical Medicine and Rehabilitation

## 2017-12-01 ENCOUNTER — Encounter: Payer: Self-pay | Admitting: Physician Assistant

## 2017-12-01 ENCOUNTER — Other Ambulatory Visit: Payer: Self-pay | Admitting: Physician Assistant

## 2017-12-04 ENCOUNTER — Encounter (HOSPITAL_COMMUNITY): Payer: Self-pay

## 2017-12-04 ENCOUNTER — Ambulatory Visit (HOSPITAL_COMMUNITY)
Admission: RE | Admit: 2017-12-04 | Discharge: 2017-12-04 | Disposition: A | Discharge status: Home / Self Care | Payer: Medicare Other | Source: Ambulatory Visit | Attending: Hematology & Oncology | Admitting: Hematology & Oncology

## 2017-12-04 DIAGNOSIS — Z79899 Other long term (current) drug therapy: Secondary | ICD-10-CM | POA: Insufficient documentation

## 2017-12-04 DIAGNOSIS — M549 Dorsalgia, unspecified: Secondary | ICD-10-CM | POA: Diagnosis not present

## 2017-12-04 DIAGNOSIS — Z809 Family history of malignant neoplasm, unspecified: Secondary | ICD-10-CM | POA: Diagnosis not present

## 2017-12-04 DIAGNOSIS — Z9049 Acquired absence of other specified parts of digestive tract: Secondary | ICD-10-CM | POA: Insufficient documentation

## 2017-12-04 DIAGNOSIS — M50322 Other cervical disc degeneration at C5-C6 level: Secondary | ICD-10-CM | POA: Diagnosis not present

## 2017-12-04 DIAGNOSIS — C7951 Secondary malignant neoplasm of bone: Secondary | ICD-10-CM | POA: Diagnosis not present

## 2017-12-04 DIAGNOSIS — I129 Hypertensive chronic kidney disease with stage 1 through stage 4 chronic kidney disease, or unspecified chronic kidney disease: Secondary | ICD-10-CM | POA: Diagnosis not present

## 2017-12-04 DIAGNOSIS — Z8249 Family history of ischemic heart disease and other diseases of the circulatory system: Secondary | ICD-10-CM | POA: Insufficient documentation

## 2017-12-04 DIAGNOSIS — N400 Enlarged prostate without lower urinary tract symptoms: Secondary | ICD-10-CM | POA: Diagnosis not present

## 2017-12-04 DIAGNOSIS — C801 Malignant (primary) neoplasm, unspecified: Secondary | ICD-10-CM

## 2017-12-04 DIAGNOSIS — I872 Venous insufficiency (chronic) (peripheral): Secondary | ICD-10-CM | POA: Diagnosis not present

## 2017-12-04 DIAGNOSIS — S12110A Anterior displaced Type II dens fracture, initial encounter for closed fracture: Secondary | ICD-10-CM | POA: Diagnosis not present

## 2017-12-04 DIAGNOSIS — G8929 Other chronic pain: Secondary | ICD-10-CM | POA: Insufficient documentation

## 2017-12-04 DIAGNOSIS — Z87891 Personal history of nicotine dependence: Secondary | ICD-10-CM | POA: Diagnosis not present

## 2017-12-04 DIAGNOSIS — M25552 Pain in left hip: Secondary | ICD-10-CM | POA: Insufficient documentation

## 2017-12-04 DIAGNOSIS — C189 Malignant neoplasm of colon, unspecified: Secondary | ICD-10-CM | POA: Insufficient documentation

## 2017-12-04 DIAGNOSIS — C8339 Diffuse large B-cell lymphoma, extranodal and solid organ sites: Secondary | ICD-10-CM | POA: Diagnosis not present

## 2017-12-04 DIAGNOSIS — Z8673 Personal history of transient ischemic attack (TIA), and cerebral infarction without residual deficits: Secondary | ICD-10-CM | POA: Diagnosis not present

## 2017-12-04 DIAGNOSIS — M899 Disorder of bone, unspecified: Secondary | ICD-10-CM | POA: Diagnosis not present

## 2017-12-04 DIAGNOSIS — Z79891 Long term (current) use of opiate analgesic: Secondary | ICD-10-CM | POA: Insufficient documentation

## 2017-12-04 DIAGNOSIS — Z9889 Other specified postprocedural states: Secondary | ICD-10-CM | POA: Insufficient documentation

## 2017-12-04 DIAGNOSIS — N183 Chronic kidney disease, stage 3 (moderate): Secondary | ICD-10-CM | POA: Diagnosis not present

## 2017-12-04 LAB — CBC
HCT: 41.5 % (ref 39.0–52.0)
HEMOGLOBIN: 13.9 g/dL (ref 13.0–17.0)
MCH: 30.8 pg (ref 26.0–34.0)
MCHC: 33.5 g/dL (ref 30.0–36.0)
MCV: 92 fL (ref 78.0–100.0)
PLATELETS: 301 10*3/uL (ref 150–400)
RBC: 4.51 MIL/uL (ref 4.22–5.81)
RDW: 14.2 % (ref 11.5–15.5)
WBC: 9.9 10*3/uL (ref 4.0–10.5)

## 2017-12-04 LAB — PROTIME-INR
INR: 0.87
Prothrombin Time: 11.8 seconds (ref 11.4–15.2)

## 2017-12-04 LAB — APTT: APTT: 26 s (ref 24–36)

## 2017-12-04 MED ORDER — NALOXONE HCL 0.4 MG/ML IJ SOLN
INTRAMUSCULAR | Status: AC
  Filled 2017-12-04: qty 1

## 2017-12-04 MED ORDER — SODIUM CHLORIDE 0.9 % IV SOLN
INTRAVENOUS | Status: DC
  Administered 2017-12-04: 10:00:00 via INTRAVENOUS

## 2017-12-04 MED ORDER — FENTANYL CITRATE (PF) 100 MCG/2ML IJ SOLN
INTRAMUSCULAR | Status: AC | PRN
  Administered 2017-12-04 (×2): 25 ug via INTRAVENOUS

## 2017-12-04 MED ORDER — MIDAZOLAM HCL 2 MG/2ML IJ SOLN
INTRAMUSCULAR | Status: AC
  Filled 2017-12-04: qty 4

## 2017-12-04 MED ORDER — FENTANYL CITRATE (PF) 100 MCG/2ML IJ SOLN
INTRAMUSCULAR | Status: AC
  Filled 2017-12-04: qty 2

## 2017-12-04 MED ORDER — MIDAZOLAM HCL 2 MG/2ML IJ SOLN
INTRAMUSCULAR | Status: AC | PRN
  Administered 2017-12-04: 0.5 mg via INTRAVENOUS

## 2017-12-04 MED ORDER — FLUMAZENIL 0.5 MG/5ML IV SOLN
INTRAVENOUS | Status: AC
  Filled 2017-12-04: qty 5

## 2017-12-04 NOTE — Discharge Instructions (Signed)
Moderate Conscious Sedation, Adult, Care After These instructions provide you with information about caring for yourself after your procedure. Your health care provider may also give you more specific instructions. Your treatment has been planned according to current medical practices, but problems sometimes occur. Call your health care provider if you have any problems or questions after your procedure. What can I expect after the procedure? After your procedure, it is common:  To feel sleepy for several hours.  To feel clumsy and have poor balance for several hours.  To have poor judgment for several hours.  To vomit if you eat too soon.  Follow these instructions at home: For at least 24 hours after the procedure:   Do not: ? Participate in activities where you could fall or become injured. ? Drive. ? Use heavy machinery. ? Drink alcohol. ? Take sleeping pills or medicines that cause drowsiness. ? Make important decisions or sign legal documents. ? Take care of children on your own.  Rest. Eating and drinking  Follow the diet recommended by your health care provider.  If you vomit: ? Drink water, juice, or soup when you can drink without vomiting. ? Make sure you have little or no nausea before eating solid foods. General instructions  Have a responsible adult stay with you until you are awake and alert.  Take over-the-counter and prescription medicines only as told by your health care provider.  If you smoke, do not smoke without supervision.  Keep all follow-up visits as told by your health care provider. This is important. Contact a health care provider if:  You keep feeling nauseous or you keep vomiting.  You feel light-headed.  You develop a rash.  You have a fever. Get help right away if:  You have trouble breathing. This information is not intended to replace advice given to you by your health care provider. Make sure you discuss any questions you have  with your health care provider. Document Released: 01/16/2013 Document Revised: 08/31/2015 Document Reviewed: 07/18/2015 Elsevier Interactive Patient Education  2018 [employer].   Needle Biopsy of the Bone, Care After Refer to this sheet in the next few weeks. These instructions provide you with information about caring for yourself after your procedure. Your health care provider may also give you more specific instructions. Your treatment has been planned according to current medical practices, but problems sometimes occur. Call your health care provider if you have any problems or questions after your procedure. What can I expect after the procedure? After your procedure, it is common to have soreness or tenderness at the puncture site. Follow these instructions at home:  Take over-the-counter and prescription medicines only as told by your health care provider.  Bathe and shower as told by your health care provider.  You may shower tomorrow.  Follow instructions from your health care provider about: ? How to take care of your puncture site. ? When and how you should change your bandage (dressing).   ? When you should remove your dressing.  You may remove your dressing tomorrow.  Check your puncture site every day for signs of infection. Watch for: ? Redness, swelling, or worsening pain. ? Fluid, blood, or pus.  Return to your normal activities as told by your health care provider.  Keep all follow-up visits as told by your health care provider. This is important. Contact a health care provider if:  You have redness, swelling, or worsening pain at the site of your puncture.  You have  fluid, blood, or pus coming from your puncture site.  You have a fever.  You have persistent nausea or vomiting. Get help right away if:  You develop a rash.  You have difficulty breathing. This information is not intended to replace advice given to you by your health care provider. Make  sure you discuss any questions you have with your health care provider. Document Released: 10/15/2004 Document Revised: 09/03/2015 Document Reviewed: 05/05/2014 Elsevier Interactive Patient Education  [employer].

## 2017-12-04 NOTE — H&P (Signed)
Chief Complaint: Patient was seen in consultation today for image guided biopsy of left sacral/iliac lytic lesion  Referring Physician(s): Ennever,Peter R  Supervising Physician: Sandi Mariscal  Patient Status: [facility] - Out-pt  History of Present Illness: Brendan Gonzalez is a 84 y.o. male with remote history of colon cancer and now with back/left hip pain and recent imaging revealing lytic destruction of left sacral region as well as lesion involving the left inferior pubic ramus.  He presents today for image guided left sacral lytic lesion biopsy for further evaluation.  Past Medical History:  Diagnosis Date  . C2 cervical fracture ([place]) 07/2017   Type II morphology with posterior displacement--C collar (potentially indefinitely).  . Chronic lower back pain   . Chronic renal insufficiency, stage III (moderate) (HCC)    GFR 50s-60s  . Colon cancer ([place]) 1993  . CVA (cerebral vascular accident) ([place])    Left MCA territory.  Carotid dopplers ok, Brain MRA ok, ECHO with grd I DD.  [name] Hypertension   . Lumbar spinal stenosis    Surgery 2012 helped but in 2015 pain has returned  . Lytic bone lesions on xray 08/2017   Skull, mildly abnormal bone scan. SPEP/UPEP w/out monoclonal spike.  Dr. Marin Olp eval 08/2017: plan CT C/A/P but not done as of 11/22/17.  ED visit s/p fall 11/17/17-->multiple lytic lesions now, L sacrum destroyed by tumor, lytic lesion in pelvis. Dr. Marin Olp has arranged for bx, also plan for RT and XGeva.  . Metastasis to bone of unknown primary ([place]) 11/27/2017  . Osteoarthritis   . Venous insufficiency of both lower extremities    made worse by amlodipine    Past Surgical History:  Procedure Laterality Date  . CHOLECYSTECTOMY  1984  . INGUINAL HERNIA REPAIR  1974   Bilat  . LUMBAR SPINE SURGERY  2012   for spinal stenosis (Dr. Jonelle Sports in [place])  . LUNG LOBECTOMY  1993   LLL per pt--worry of possible cancer but turned out to be benign.  [name] PARTIAL COLECTOMY  1993   for  colon cancer    Allergies: Patient has no known allergies.  Medications: Prior to Admission medications   Medication Sig Start Date End Date Taking? Authorizing Provider  amLODipine (NORVASC) 10 MG tablet Take 1 tablet (10 mg total) by mouth daily. 09/28/17   McGowen, Adrian Blackwater, MD  atenolol (TENORMIN) 100 MG tablet Take 1 tablet (100 mg total) by mouth daily. 09/28/17   McGowen, Adrian Blackwater, MD  atorvastatin (LIPITOR) 10 MG tablet TAKE ONE TABLET BY MOUTH ONCE DAILY Patient not taking: Reported on 11/22/2017 09/28/17   Tammi Sou, MD  cholecalciferol (VITAMIN D) 1000 UNITS tablet Take 1,000 Units by mouth daily.    [provider]  clopidogrel (PLAVIX) 75 MG tablet TAKE 1 TABLET BY MOUTH DAILY 05/19/17   McGowen, Adrian Blackwater, MD  diclofenac sodium (VOLTAREN) 1 % GEL Apply 4 g topically 4 (four) times daily. Patient not taking: Reported on 11/22/2017 11/17/17   Langston Masker B, PA-C  hydrochlorothiazide (MICROZIDE) 12.5 MG capsule Take 1 capsule (12.5 mg total) by mouth daily. Patient not taking: Reported on 11/22/2017 11/09/16   Tammi Sou, MD  oxyCODONE (OXY IR/ROXICODONE) 5 MG immediate release tablet 1-2 tabs po qid prn pain 11/22/17   McGowen, Adrian Blackwater, MD  vitamin C (ASCORBIC ACID) 500 MG tablet Take 500 mg by mouth daily.    [provider]  vitamin E 400 UNIT capsule Take 400 Units by mouth  every other day.    [provider]     Family History  Problem Relation Age of Onset  . Parkinson's disease Mother   . Heart attack Mother   . Cancer Brother   . Cancer Daughter     Social History   Socioeconomic History  . Marital status: Married    Spouse name: Not on file  . Number of children: Not on file  . Years of education: Not on file  . Highest education level: Not on file  Occupational History  . Not on file  Social Needs  . Financial resource strain: Not on file  . Food insecurity:    Worry: Not on file    Inability: Not on file  .  Transportation needs:    Medical: Not on file    Non-medical: Not on file  Tobacco Use  . Smoking status: Former Research scientist (life sciences)  . Smokeless tobacco: Never Used  Substance and Sexual Activity  . Alcohol use: Yes    Comment: daily, gin every night  . Drug use: No  . Sexual activity: Not on file  Lifestyle  . Physical activity:    Days per week: Not on file    Minutes per session: Not on file  . Stress: Not on file  Relationships  . Social connections:    Talks on phone: Not on file    Gets together: Not on file    Attends religious service: Not on file    Active member of club or organization: Not on file    Attends meetings of clubs or organizations: Not on file    Relationship status: Not on file  Other Topics Concern  . Not on file  Social History Narrative   Married, 1 son and 1 daughter.   Education: BS in Pharmacologist.   Occupation: Pharmacologist (retired).   As of 01/2014, living at [facility] with his wife.   No T/A/Ds.        Review of Systems currently denies fever, headache, chest pain, dyspnea, cough, abdominal pain, nausea, vomiting; he does complain of left shoulder/ back and left hip pain along with loose stools.  Vital Signs:pend   Physical Exam awake, alert.  Chest clear to auscultation bilaterally.  Heart with regular rate and rhythm.  Abdomen soft, positive bowel sounds, nontender.  No lower extremity edema.  Imaging: Ct Head Wo Contrast  Result Date: 11/17/2017 CLINICAL DATA:  84 year old male with history of minor head trauma. EXAM: CT HEAD WITHOUT CONTRAST CT CERVICAL SPINE WITHOUT CONTRAST TECHNIQUE: Multidetector CT imaging of the head and cervical spine was performed following the standard protocol without intravenous contrast. Multiplanar CT image reconstructions of the cervical spine were also generated. COMPARISON:  Cervical spine CT 10/09/2017.  Head CT 07/15/2017. FINDINGS: CT HEAD FINDINGS Brain: Mild cerebral and cerebellar atrophy. Patchy and  confluent areas of decreased attenuation are noted throughout the deep and periventricular white matter of the cerebral hemispheres bilaterally, compatible with chronic microvascular ischemic disease. No evidence of acute infarction, hemorrhage, hydrocephalus, extra-axial collection or mass lesion/mass effect. Vascular: No hyperdense vessel or unexpected calcification. Skull: Again noted are several lytic lesions in the skull, which appear slightly increased in size, including a 1.4 x 2.1 cm lesion in the high right parietal region (axial image 70 of series 3), as well as 14 x 18 mm and 12 x 15 mm left frontal lesions (axial images 68 and 67 of series 3 respectively). In addition, there are at least 2 new lesions.  One of these is smoothly marginated in the left parietal region (axial image 76 of series 3) measuring 8 x 10 mm, while the other is in the left supraorbital region (axial image 48 of series 3) where there is a full-thickness lytic lesion with overlying soft tissue measuring 16 x 28 mm. Sinuses/Orbits: No acute finding. Other: None. CT CERVICAL SPINE FINDINGS Alignment: 3 mm of anterolisthesis of C4 upon C5. While there is 4 mm of distraction of the abdomen toilet fracture (discussed below), this results in approximately 2 mm of posterior displacement of C1 and tip of odontoid process relative to C2. Skull base and vertebrae: Mildly displaced slightly obliquely oriented type 2 odontoid fracture with approximately 4 mm of distraction. No other acute displaced fractures are identified. Soft tissues and spinal canal: No prevertebral fluid or swelling. No visible canal hematoma. Disc levels: Severe multilevel degenerative disc disease, most pronounced at C5-C6 and C6-C7. At C5-C6 there is complete bony fusion. Severe multilevel facet arthropathy. Upper chest: Unremarkable. Other: None. IMPRESSION: 1. Increased number and size of lytic lesions throughout the skull, highly concerning for progressive metastatic  disease to the bones. Further clinical evaluation is recommended. 2. No acute intracranial abnormalities. 3. Obliquely oriented mildly displaced type 2 odontoid fracture, similar to the prior examination. No new signs of additional acute traumatic injury to the cervical spine are noted on today's examination. 4. Cerebral atrophy with chronic microvascular ischemic changes in the cerebral white matter, as above. Electronically Signed   By: Vinnie Langton M.D.   On: 11/17/2017 17:43   Ct Cervical Spine Wo Contrast  Result Date: 11/17/2017 CLINICAL DATA:  84 year old male with history of minor head trauma. EXAM: CT HEAD WITHOUT CONTRAST CT CERVICAL SPINE WITHOUT CONTRAST TECHNIQUE: Multidetector CT imaging of the head and cervical spine was performed following the standard protocol without intravenous contrast. Multiplanar CT image reconstructions of the cervical spine were also generated. COMPARISON:  Cervical spine CT 10/09/2017.  Head CT 07/15/2017. FINDINGS: CT HEAD FINDINGS Brain: Mild cerebral and cerebellar atrophy. Patchy and confluent areas of decreased attenuation are noted throughout the deep and periventricular white matter of the cerebral hemispheres bilaterally, compatible with chronic microvascular ischemic disease. No evidence of acute infarction, hemorrhage, hydrocephalus, extra-axial collection or mass lesion/mass effect. Vascular: No hyperdense vessel or unexpected calcification. Skull: Again noted are several lytic lesions in the skull, which appear slightly increased in size, including a 1.4 x 2.1 cm lesion in the high right parietal region (axial image 70 of series 3), as well as 14 x 18 mm and 12 x 15 mm left frontal lesions (axial images 68 and 67 of series 3 respectively). In addition, there are at least 2 new lesions. One of these is smoothly marginated in the left parietal region (axial image 76 of series 3) measuring 8 x 10 mm, while the other is in the left supraorbital region (axial  image 48 of series 3) where there is a full-thickness lytic lesion with overlying soft tissue measuring 16 x 28 mm. Sinuses/Orbits: No acute finding. Other: None. CT CERVICAL SPINE FINDINGS Alignment: 3 mm of anterolisthesis of C4 upon C5. While there is 4 mm of distraction of the abdomen toilet fracture (discussed below), this results in approximately 2 mm of posterior displacement of C1 and tip of odontoid process relative to C2. Skull base and vertebrae: Mildly displaced slightly obliquely oriented type 2 odontoid fracture with approximately 4 mm of distraction. No other acute displaced fractures are identified. Soft tissues and spinal canal:  No prevertebral fluid or swelling. No visible canal hematoma. Disc levels: Severe multilevel degenerative disc disease, most pronounced at C5-C6 and C6-C7. At C5-C6 there is complete bony fusion. Severe multilevel facet arthropathy. Upper chest: Unremarkable. Other: None. IMPRESSION: 1. Increased number and size of lytic lesions throughout the skull, highly concerning for progressive metastatic disease to the bones. Further clinical evaluation is recommended. 2. No acute intracranial abnormalities. 3. Obliquely oriented mildly displaced type 2 odontoid fracture, similar to the prior examination. No new signs of additional acute traumatic injury to the cervical spine are noted on today's examination. 4. Cerebral atrophy with chronic microvascular ischemic changes in the cerebral white matter, as above. Electronically Signed   By: Vinnie Langton M.D.   On: 11/17/2017 17:43   Ct Lumbar Spine Wo Contrast  Result Date: 11/17/2017 CLINICAL DATA:  Left-sided back pain extending into the left buttock and lower extremity. Colon cancer. EXAM: CT LUMBAR SPINE WITHOUT CONTRAST TECHNIQUE: Multidetector CT imaging of the lumbar spine was performed without intravenous contrast administration. Multiplanar CT image reconstructions were also generated. COMPARISON:  CT of lumbar spine  11/26/2013. Whole-body bone scan 08/01/2017. FINDINGS: Segmentation: 5 non rib-bearing lumbar type vertebral bodies are present. The lowest fully formed vertebral body is L5. Alignment: AP alignment is anatomic. Rightward curvature of the lumbar spine is centered at L2-3. Leftward curvature is centered at L4-5. Vertebrae: Chronic endplate sclerotic changes are present on the left at L2-3 and L3-4. Asymmetric right-sided sclerotic changes are present at L4-5 and L5-S1. Paraspinal and other soft tissues: Atherosclerotic calcifications are present in the aorta and branch vessels. There is no aneurysm or significant interval change. No focal lesions are present. Disc levels: L1-2: Broad-based disc protrusion is present. Moderate foraminal narrowing is worse on the right. Mild subarticular narrowing is present bilaterally. L3-4: A broad-based disc protrusion is present. Facet hypertrophy is worse on the left. Moderate subarticular narrowing is worse on the left. Severe left and moderate right foraminal narrowing demonstrates progression. L3-4: A broad-based disc protrusion is present. Severe left and moderate right subarticular narrowing is secondary to the disc protrusion and facet hypertrophy. Severe left and moderate right foraminal stenosis is again seen. L4-5: Left laminectomy is noted. Central canal is decompressed posteriorly on the left. Moderate to severe right subarticular narrowing is present. Severe right and moderate left foraminal stenosis is present. L5-S1: A broad-based disc protrusion is present. Moderate facet hypertrophy is noted bilaterally. Severe foraminal stenosis bilaterally secondary to facet spurring. IMPRESSION: 1. Progressive multilevel spondylosis of the lumbar spine as described. 2. Scoliosis. 3. Endplate sclerotic changes. Degenerative without evidence for metastatic disease to the spine. Electronically Signed   By: San Morelle M.D.   On: 11/17/2017 17:55   Ct Hip Left Wo  Contrast  Result Date: 11/17/2017 CLINICAL DATA:  Left-sided back and leg pain and hip pain. EXAM: CT OF THE LEFT HIP WITHOUT CONTRAST TECHNIQUE: Multidetector CT imaging of the left hip was performed according to the standard protocol. Multiplanar CT image reconstructions were also generated. COMPARISON:  None. FINDINGS: The left sacrum is destroyed by tumor. There is also a lytic lesion involving the inferior pubic ramus with a small pathologic fracture. No hip fracture or bone lesion. Stable benign bone lesion in the intertrochanteric region containing fat. The surrounding hip and pelvic musculature are grossly normal for age. Advanced fatty atrophy of the gluteus minimus muscle and scattered fatty atrophy of the other muscles. No significant intrapelvic abnormalities are identified. The prostate gland is enlarged and there  is mild distention of the bladder. IMPRESSION: 1. The left sacrum is destroyed by tumor. There is also a lytic destructive lesion involving the left inferior pubic ramus. This is likely metastatic disease. 2. No hip fracture or metastatic lesions involving the hip. Electronically Signed   By: Marijo Sanes M.D.   On: 11/17/2017 17:45   Dg Hip Unilat W Or Wo Pelvis 2-3 Views Left  Result Date: 11/17/2017 CLINICAL DATA:  84 year-old male c/o chronic LEFT hip pain that worsened when he fell and landed in a seated position recently. C/O LEFT ischial pain w/ radiation down left leg. EXAM: DG HIP (WITH OR WITHOUT PELVIS) 2-3V LEFT COMPARISON:  None. FINDINGS: No fracture.  No bone lesion. Hip joints are normally aligned. There is superolateral joint space narrowing of the right hip. Left hip joint is well maintained. SI joints and symphysis pubis are normally aligned. Bones are diffusely demineralized. There are dense arterial vascular calcifications. Soft tissues are otherwise unremarkable. Marginal IMPRESSION: 1. No fracture or dislocation.  No acute finding. Electronically Signed   By: Lajean Manes M.D.   On: 11/17/2017 14:35    Labs:  CBC: Recent Labs    07/15/17 1213 07/20/17 1416 08/09/17 1533 11/23/17 0915  WBC 12.7* 9.1 9.8 13.5*  HGB 13.9 13.1* 13.0 12.8*  HCT 39.4 38.3* 38.1* 38.3*  PLT 222 262 233 256.0    COAGS: No results for input(s): INR, APTT in the last 8760 hours.  BMP: Recent Labs    07/15/17 1213  08/03/17 1518 08/09/17 1533 10/05/17 1029 11/23/17 0915  NA 130*   < > 138 137 138 132*  K 4.2   < > 4.4 3.8 4.3 4.5  CL 94*   < > 103 99 101 94*  CO2 25   < > 28 30 31 30   GLUCOSE 110*   < > 91 92 99 83  BUN 19   < > 21 22 23 20   CALCIUM 9.3   < > 9.1 9.4 9.3 10.1  CREATININE 0.97   < > 1.22* 1.30* 1.26 1.22  GFRNONAA >60  --   --   --   --   --   GFRAA >60  --   --   --   --   --    < > = values in this interval not displayed.    LIVER FUNCTION TESTS: Recent Labs    07/15/17 1213 07/20/17 1137 07/20/17 1416 08/09/17 1533 11/23/17 0915  BILITOT 0.9  --  0.7 0.8 1.2  AST 24  --  30 20 24   ALT 15*  --  39 20 26  ALKPHOS 72  --   --  82 138*  PROT 7.0 6.5 6.2 6.7 6.8  ALBUMIN 4.0  --   --  3.6 4.1    TUMOR MARKERS: No results for input(s): AFPTM, CEA, CA199, CHROMGRNA in the last 8760 hours.  Assessment and Plan: 84 y.o. male with remote history of colon cancer and now with back/left hip pain and recent imaging revealing lytic destruction of left sacral region as well as lesion involving the left inferior pubic ramus.  He presents today for image guided left sacral lytic lesion biopsy for further evaluation.Risks and benefits discussed with the patient/spouse including, but not limited to bleeding, infection, damage to adjacent structures or low yield requiring additional tests.  All of the patient's questions were answered, patient is agreeable to proceed. Consent signed and in chart.   LABS PENDING  Thank you for this interesting consult.  I greatly enjoyed meeting SHMUEL GIRGIS and look forward to participating in their  care.  A copy of this report was sent to the requesting provider on this date.  Electronically Signed: D. Rowe Robert, PA-C 12/04/2017, 8:53 AM   I spent a total of 25 minutes in face to face in clinical consultation, greater than 50% of which was counseling/coordinating care for guided left sacral lytic lesion biopsy

## 2017-12-04 NOTE — Procedures (Signed)
Pre procedural Dx: Lytic sacral lesion  Post procedural Dx: Same  Technically successful CT guided biopsy of lytic lesion involving the left side of the sacrum.   EBL: None.   Complications: None immediate.   Ronny Bacon, MD Pager #: [phone]

## 2017-12-05 ENCOUNTER — Other Ambulatory Visit: Payer: Self-pay

## 2017-12-05 ENCOUNTER — Ambulatory Visit
Admission: RE | Admit: 2017-12-05 | Discharge: 2017-12-05 | Disposition: A | Discharge status: Home / Self Care | Payer: Medicare Other | Source: Ambulatory Visit | Attending: Radiation Oncology | Admitting: Radiation Oncology

## 2017-12-05 ENCOUNTER — Encounter: Payer: Self-pay | Admitting: Radiation Oncology

## 2017-12-05 VITALS — BP 143/78 | HR 60 | Temp 97.6°F | Resp 17 | Wt 159.2 lb

## 2017-12-05 DIAGNOSIS — Z9049 Acquired absence of other specified parts of digestive tract: Secondary | ICD-10-CM | POA: Diagnosis not present

## 2017-12-05 DIAGNOSIS — Z7902 Long term (current) use of antithrombotics/antiplatelets: Secondary | ICD-10-CM | POA: Diagnosis not present

## 2017-12-05 DIAGNOSIS — Z87891 Personal history of nicotine dependence: Secondary | ICD-10-CM | POA: Insufficient documentation

## 2017-12-05 DIAGNOSIS — C7951 Secondary malignant neoplasm of bone: Secondary | ICD-10-CM | POA: Insufficient documentation

## 2017-12-05 DIAGNOSIS — Z8673 Personal history of transient ischemic attack (TIA), and cerebral infarction without residual deficits: Secondary | ICD-10-CM | POA: Insufficient documentation

## 2017-12-05 DIAGNOSIS — I129 Hypertensive chronic kidney disease with stage 1 through stage 4 chronic kidney disease, or unspecified chronic kidney disease: Secondary | ICD-10-CM | POA: Insufficient documentation

## 2017-12-05 DIAGNOSIS — N183 Chronic kidney disease, stage 3 (moderate): Secondary | ICD-10-CM | POA: Diagnosis not present

## 2017-12-05 DIAGNOSIS — Z79899 Other long term (current) drug therapy: Secondary | ICD-10-CM | POA: Insufficient documentation

## 2017-12-05 DIAGNOSIS — G893 Neoplasm related pain (acute) (chronic): Secondary | ICD-10-CM | POA: Diagnosis not present

## 2017-12-05 DIAGNOSIS — G8929 Other chronic pain: Secondary | ICD-10-CM | POA: Diagnosis not present

## 2017-12-05 DIAGNOSIS — C801 Malignant (primary) neoplasm, unspecified: Secondary | ICD-10-CM | POA: Diagnosis not present

## 2017-12-05 DIAGNOSIS — Z923 Personal history of irradiation: Secondary | ICD-10-CM | POA: Insufficient documentation

## 2017-12-05 NOTE — Progress Notes (Signed)
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  HISTORY OF PRESENT ILLNESS::Brendan Gonzalez is a 84 y.o. male who is seen out courtesy of Dr. Burney Gauze for an opinion concerning radiation therapy for what appears to be metastatic cancer to the left sacrum. Patient presented with increasing pain in the left buttocks region.  A CT scan of the left pelvis was performed which revealed a destructive lesion involving the left sacrum. There is also lytic lesion involving the inferior pubic ramus with a small pathologic fracture in this area. Imaging was suspicious for metastatic disease. Yesterday the patient underwent biopsy with results pending at this time. He is now seen in radiation oncology to consider palliative radiation therapy to help with this pain issues.  PREVIOUS RADIATION THERAPY: No, Patient did not receive radiation therapy as part of his colon cancer treatment. He had surgery for what was felt to be lung cancer but final pathology revealed no evidence of malignancy.  PAST MEDICAL HISTORY:  has a past medical history of C2 cervical fracture ([place]) (07/2017), Chronic lower back pain, Chronic renal insufficiency, stage III (moderate) ([place]), Colon cancer ([place]) (1993), CVA (cerebral vascular accident) ([place]), Hypertension, Lumbar spinal stenosis, Lytic bone lesions on xray (08/2017), Metastasis to bone of unknown primary ([place]) (11/27/2017), Osteoarthritis, and Venous insufficiency of both lower extremities.    PAST SURGICAL HISTORY: Past Surgical History:  Procedure Laterality Date  . CHOLECYSTECTOMY  1984  . INGUINAL HERNIA REPAIR  1974   [place]  . LUMBAR SPINE SURGERY  2012   for  spinal stenosis (Dr. Jonelle Sports in [place])  . LUNG LOBECTOMY  1993   LLL per pt--worry of possible cancer but turned out to be benign.  [name] PARTIAL COLECTOMY  1993   for colon cancer    FAMILY HISTORY: family history includes Cancer in his brother and daughter; Heart attack in his mother; Parkinson's disease in his mother.  SOCIAL HISTORY:  reports that he has quit smoking. He has never used smokeless tobacco. He reports that he drinks alcohol. He reports that he does not use drugs.  ALLERGIES: Patient has no known allergies.  MEDICATIONS:  Current Outpatient Medications  Medication Sig Dispense Refill  . amLODipine (NORVASC) 10 MG tablet Take 1 tablet (10 mg total) by mouth daily. 90 tablet 0  . atenolol (TENORMIN) 100 MG tablet Take 1 tablet (100 mg total) by mouth daily. 90 tablet 0  . cholecalciferol (VITAMIN D) 1000 UNITS tablet Take 1,000 Units by mouth daily.    [name] oxyCODONE (OXY IR/ROXICODONE) 5 MG immediate release tablet 1-2 tabs po qid prn pain 84 tablet 0  . vitamin C (ASCORBIC ACID) 500 MG tablet Take 500 mg by mouth daily.    . vitamin E 400 UNIT capsule Take 400 Units by mouth every other day.    [name] atorvastatin (LIPITOR) 10 MG tablet TAKE ONE TABLET BY MOUTH ONCE DAILY (Patient not taking: Reported on 11/22/2017) 90 tablet 0  . clopidogrel (PLAVIX) 75 MG tablet TAKE 1 TABLET BY MOUTH DAILY (Patient not taking: Reported on 12/05/2017) 90 tablet 1  . diclofenac sodium (VOLTAREN) 1 % GEL Apply 4 g topically 4 (four) times daily. (Patient not taking: Reported  on 11/22/2017) 100 g 0  . hydrochlorothiazide (MICROZIDE) 12.5 MG capsule Take 1 capsule (12.5 mg total) by mouth daily. (Patient not taking: Reported on 11/22/2017) 90 capsule 3   No current facility-administered medications for this encounter.     REVIEW OF SYSTEMS:REVIEW OF SYSTEMS: A 10+ POINT REVIEW OF SYSTEMS WAS OBTAINED including neurology, dermatology, psychiatry, cardiac, respiratory, lymph, extremities, GI, GU,  musculoskeletal, constitutional, reproductive, HEENT. All pertinent positives are noted in the HPI. All others are negative. He denies any weakness in his left lower extremity. He denies any numbness or tingling in his left lower extremity. Patient ambulates with assistance of a cane   PHYSICAL EXAM:  weight is 159 lb 3.2 oz (72.2 kg). His oral temperature is 97.6 F (36.4 C). His blood pressure is 143/78 (abnormal) and his pulse is 60. His respiration is 17 and oxygen saturation is 100%.   General: Alert and oriented, in no acute distress HEENT: Head is normocephalic. Extraocular movements are intact. Oropharynx is clear. Neck: Neck is supple, no palpable cervical or supraclavicular lymphadenopathy. Heart: Regular in rate and rhythm with no murmurs, rubs, or gallops. Chest: Clear to auscultation bilaterally, with no rhonchi, wheezes, or rales. Abdomen: Soft, nontender, nondistended, with no rigidity or guarding. Extremities: No cyanosis or edema. Lymphatics: see Neck Exam Skin: No concerning lesions. Musculoskeletal: symmetric strength and muscle tone throughout. Mild discomfort with palpation in the left sacroiliac joint and left buttocks region Neurologic: Cranial nerves II through XII are grossly intact. No obvious focalities. Speech is fluent. Coordination is intact. Psychiatric: Judgment and insight are intact. Affect is appropriate.     ECOG = 2  0 - Asymptomatic (Fully active, able to carry on all predisease activities without restriction)  1 - Symptomatic but completely ambulatory (Restricted in physically strenuous activity but ambulatory and able to carry out work of a light or sedentary nature. For example, light housework, office work)  2 - Symptomatic, <50% in bed during the day (Ambulatory and capable of all self care but unable to carry out any work activities. Up and about more than 50% of waking hours)  3 - Symptomatic, >50% in bed, but not bedbound (Capable of only  limited self-care, confined to bed or chair 50% or more of waking hours)  4 - Bedbound (Completely disabled. Cannot carry on any self-care. Totally confined to bed or chair)  5 - Death   Eustace Pen MM, Creech RH, Tormey DC, et al. [phone]). "Toxicity and response criteria of the [facility]". [place] Oncol. 5 (6): 649-55  LABORATORY DATA:  Lab Results  Component Value Date   WBC 9.9 12/04/2017   HGB 13.9 12/04/2017   HCT 41.5 12/04/2017   MCV 92.0 12/04/2017   PLT 301 12/04/2017   NEUTROABS 10.5 (H) 11/23/2017   Lab Results  Component Value Date   NA 132 (L) 11/23/2017   K 4.5 11/23/2017   CL 94 (L) 11/23/2017   CO2 30 11/23/2017   GLUCOSE 83 11/23/2017   CREATININE 1.22 11/23/2017   CALCIUM 10.1 11/23/2017      RADIOGRAPHY: Ct Head Wo Contrast  Result Date: 11/17/2017 CLINICAL DATA:  84 year old male with history of minor head trauma. EXAM: CT HEAD WITHOUT CONTRAST CT CERVICAL SPINE WITHOUT CONTRAST TECHNIQUE: Multidetector CT imaging of the head and cervical spine was performed following the standard protocol without intravenous contrast. Multiplanar CT image reconstructions of the cervical spine were also generated. COMPARISON:  Cervical spine CT 10/09/2017.  Head CT 07/15/2017. FINDINGS:  CT HEAD FINDINGS Brain: Mild cerebral and cerebellar atrophy. Patchy and confluent areas of decreased attenuation are noted throughout the deep and periventricular white matter of the cerebral hemispheres bilaterally, compatible with chronic microvascular ischemic disease. No evidence of acute infarction, hemorrhage, hydrocephalus, extra-axial collection or mass lesion/mass effect. Vascular: No hyperdense vessel or unexpected calcification. Skull: Again noted are several lytic lesions in the skull, which appear slightly increased in size, including a 1.4 x 2.1 cm lesion in the high right parietal region (axial image 70 of series 3), as well as 14 x 18 mm and 12 x 15 mm left  frontal lesions (axial images 68 and 67 of series 3 respectively). In addition, there are at least 2 new lesions. One of these is smoothly marginated in the left parietal region (axial image 76 of series 3) measuring 8 x 10 mm, while the other is in the left supraorbital region (axial image 48 of series 3) where there is a full-thickness lytic lesion with overlying soft tissue measuring 16 x 28 mm. Sinuses/Orbits: No acute finding. Other: None. CT CERVICAL SPINE FINDINGS Alignment: 3 mm of anterolisthesis of C4 upon C5. While there is 4 mm of distraction of the abdomen toilet fracture (discussed below), this results in approximately 2 mm of posterior displacement of C1 and tip of odontoid process relative to C2. Skull base and vertebrae: Mildly displaced slightly obliquely oriented type 2 odontoid fracture with approximately 4 mm of distraction. No other acute displaced fractures are identified. Soft tissues and spinal canal: No prevertebral fluid or swelling. No visible canal hematoma. Disc levels: Severe multilevel degenerative disc disease, most pronounced at C5-C6 and C6-C7. At C5-C6 there is complete bony fusion. Severe multilevel facet arthropathy. Upper chest: Unremarkable. Other: None. IMPRESSION: 1. Increased number and size of lytic lesions throughout the skull, highly concerning for progressive metastatic disease to the bones. Further clinical evaluation is recommended. 2. No acute intracranial abnormalities. 3. Obliquely oriented mildly displaced type 2 odontoid fracture, similar to the prior examination. No new signs of additional acute traumatic injury to the cervical spine are noted on today's examination. 4. Cerebral atrophy with chronic microvascular ischemic changes in the cerebral white matter, as above. Electronically Signed   By: Vinnie Langton M.D.   On: 11/17/2017 17:43   Ct Cervical Spine Wo Contrast  Result Date: 11/17/2017 CLINICAL DATA:  84 year old male with history of minor head  trauma. EXAM: CT HEAD WITHOUT CONTRAST CT CERVICAL SPINE WITHOUT CONTRAST TECHNIQUE: Multidetector CT imaging of the head and cervical spine was performed following the standard protocol without intravenous contrast. Multiplanar CT image reconstructions of the cervical spine were also generated. COMPARISON:  Cervical spine CT 10/09/2017.  Head CT 07/15/2017. FINDINGS: CT HEAD FINDINGS Brain: Mild cerebral and cerebellar atrophy. Patchy and confluent areas of decreased attenuation are noted throughout the deep and periventricular white matter of the cerebral hemispheres bilaterally, compatible with chronic microvascular ischemic disease. No evidence of acute infarction, hemorrhage, hydrocephalus, extra-axial collection or mass lesion/mass effect. Vascular: No hyperdense vessel or unexpected calcification. Skull: Again noted are several lytic lesions in the skull, which appear slightly increased in size, including a 1.4 x 2.1 cm lesion in the high right parietal region (axial image 70 of series 3), as well as 14 x 18 mm and 12 x 15 mm left frontal lesions (axial images 68 and 67 of series 3 respectively). In addition, there are at least 2 new lesions. One of these is smoothly marginated in the left parietal region (axial  image 76 of series 3) measuring 8 x 10 mm, while the other is in the left supraorbital region (axial image 48 of series 3) where there is a full-thickness lytic lesion with overlying soft tissue measuring 16 x 28 mm. Sinuses/Orbits: No acute finding. Other: None. CT CERVICAL SPINE FINDINGS Alignment: 3 mm of anterolisthesis of C4 upon C5. While there is 4 mm of distraction of the abdomen toilet fracture (discussed below), this results in approximately 2 mm of posterior displacement of C1 and tip of odontoid process relative to C2. Skull base and vertebrae: Mildly displaced slightly obliquely oriented type 2 odontoid fracture with approximately 4 mm of distraction. No other acute displaced fractures  are identified. Soft tissues and spinal canal: No prevertebral fluid or swelling. No visible canal hematoma. Disc levels: Severe multilevel degenerative disc disease, most pronounced at C5-C6 and C6-C7. At C5-C6 there is complete bony fusion. Severe multilevel facet arthropathy. Upper chest: Unremarkable. Other: None. IMPRESSION: 1. Increased number and size of lytic lesions throughout the skull, highly concerning for progressive metastatic disease to the bones. Further clinical evaluation is recommended. 2. No acute intracranial abnormalities. 3. Obliquely oriented mildly displaced type 2 odontoid fracture, similar to the prior examination. No new signs of additional acute traumatic injury to the cervical spine are noted on today's examination. 4. Cerebral atrophy with chronic microvascular ischemic changes in the cerebral white matter, as above. Electronically Signed   By: Vinnie Langton M.D.   On: 11/17/2017 17:43   Ct Lumbar Spine Wo Contrast  Result Date: 11/17/2017 CLINICAL DATA:  Left-sided back pain extending into the left buttock and lower extremity. Colon cancer. EXAM: CT LUMBAR SPINE WITHOUT CONTRAST TECHNIQUE: Multidetector CT imaging of the lumbar spine was performed without intravenous contrast administration. Multiplanar CT image reconstructions were also generated. COMPARISON:  CT of lumbar spine 11/26/2013. Whole-body bone scan 08/01/2017. FINDINGS: Segmentation: 5 non rib-bearing lumbar type vertebral bodies are present. The lowest fully formed vertebral body is L5. Alignment: AP alignment is anatomic. Rightward curvature of the lumbar spine is centered at L2-3. Leftward curvature is centered at L4-5. Vertebrae: Chronic endplate sclerotic changes are present on the left at L2-3 and L3-4. Asymmetric right-sided sclerotic changes are present at L4-5 and L5-S1. Paraspinal and other soft tissues: Atherosclerotic calcifications are present in the aorta and branch vessels. There is no aneurysm or  significant interval change. No focal lesions are present. Disc levels: L1-2: Broad-based disc protrusion is present. Moderate foraminal narrowing is worse on the right. Mild subarticular narrowing is present bilaterally. L3-4: A broad-based disc protrusion is present. Facet hypertrophy is worse on the left. Moderate subarticular narrowing is worse on the left. Severe left and moderate right foraminal narrowing demonstrates progression. L3-4: A broad-based disc protrusion is present. Severe left and moderate right subarticular narrowing is secondary to the disc protrusion and facet hypertrophy. Severe left and moderate right foraminal stenosis is again seen. L4-5: Left laminectomy is noted. Central canal is decompressed posteriorly on the left. Moderate to severe right subarticular narrowing is present. Severe right and moderate left foraminal stenosis is present. L5-S1: A broad-based disc protrusion is present. Moderate facet hypertrophy is noted bilaterally. Severe foraminal stenosis bilaterally secondary to facet spurring. IMPRESSION: 1. Progressive multilevel spondylosis of the lumbar spine as described. 2. Scoliosis. 3. Endplate sclerotic changes. Degenerative without evidence for metastatic disease to the spine. Electronically Signed   By: San Morelle M.D.   On: 11/17/2017 17:55   Ct Hip Left Wo Contrast  Result Date: 11/17/2017  CLINICAL DATA:  Left-sided back and leg pain and hip pain. EXAM: CT OF THE LEFT HIP WITHOUT CONTRAST TECHNIQUE: Multidetector CT imaging of the left hip was performed according to the standard protocol. Multiplanar CT image reconstructions were also generated. COMPARISON:  None. FINDINGS: The left sacrum is destroyed by tumor. There is also a lytic lesion involving the inferior pubic ramus with a small pathologic fracture. No hip fracture or bone lesion. Stable benign bone lesion in the intertrochanteric region containing fat. The surrounding hip and pelvic musculature are  grossly normal for age. Advanced fatty atrophy of the gluteus minimus muscle and scattered fatty atrophy of the other muscles. No significant intrapelvic abnormalities are identified. The prostate gland is enlarged and there is mild distention of the bladder. IMPRESSION: 1. The left sacrum is destroyed by tumor. There is also a lytic destructive lesion involving the left inferior pubic ramus. This is likely metastatic disease. 2. No hip fracture or metastatic lesions involving the hip. Electronically Signed   By: Marijo Sanes M.D.   On: 11/17/2017 17:45   Ct Biopsy  Result Date: 12/04/2017 INDICATION: Remote history of colon cancer, now with lytic lesion involving the left side of the sacrum. Please perform CT-guided biopsy for tissue diagnostic purposes. EXAM: CT GUIDED BIOPSY LYTIC LESION INVOLVING THE LEFT SIDE OF THE SACRUM COMPARISON:  Left hip CT - 11/17/2017 MEDICATIONS: None. ANESTHESIA/SEDATION: Fentanyl 50 mcg IV; Versed 0.5 mg IV Sedation time: 10 minutes; The patient was continuously monitored during the procedure by the interventional radiology nurse under my direct supervision. CONTRAST:  None. COMPLICATIONS: None immediate. PROCEDURE: Informed consent was obtained from the patient following an explanation of the procedure, risks, benefits and alternatives. A time out was performed prior to the initiation of the procedure. The patient was positioned prone on the CT table and a limited CT was performed for procedural planning demonstrating unchanged size and appearance of lytic lesion involving the left side of the sacrum with dominant component measuring at least 4.9 x 2.7 cm (image 28, series 2). The procedure was planned. The operative site was prepped and draped in the usual sterile fashion. Appropriate trajectory was confirmed with a 22 gauge spinal needle after the adjacent tissues were anesthetized with 1% Lidocaine with epinephrine. Under intermittent CT guidance, a 17 gauge coaxial needle  was advanced into the peripheral aspect of the mass. Appropriate positioning was confirmed and 6 core needle biopsy samples were obtained with an 18 gauge core needle biopsy device. The co-axial needle was removed and hemostasis was achieved with manual compression. A limited postprocedural CT was negative for hemorrhage or additional complication. A dressing was placed. The patient tolerated the procedure well without immediate postprocedural complication. IMPRESSION: Technically successful CT guided core needle biopsy of indeterminate lytic lesion involving the left side of the sacrum. Electronically Signed   By: Sandi Mariscal M.D.   On: 12/04/2017 14:04   Dg Hip Unilat W Or Wo Pelvis 2-3 Views Left  Result Date: 11/17/2017 CLINICAL DATA:  84 year-old male c/o chronic LEFT hip pain that worsened when he fell and landed in a seated position recently. C/O LEFT ischial pain w/ radiation down left leg. EXAM: DG HIP (WITH OR WITHOUT PELVIS) 2-3V LEFT COMPARISON:  None. FINDINGS: No fracture.  No bone lesion. Hip joints are normally aligned. There is superolateral joint space narrowing of the right hip. Left hip joint is well maintained. SI joints and symphysis pubis are normally aligned. Bones are diffusely demineralized. There are dense arterial  vascular calcifications. Soft tissues are otherwise unremarkable. Marginal IMPRESSION: 1. No fracture or dislocation.  No acute finding. Electronically Signed   By: Lajean Manes M.D.   On: 11/17/2017 14:35      IMPRESSION: metastatic carcinoma of unknown primary presenting with a large destructive lesion in the left sacrum. The patient is symptomatic from this lesion with significant pain. He would be a good candidate for palliative radiation therapy directed to this lesion. Biopsy from yesterday is pending at this time. I discussed with the patient, his wife and son that we could schedule him for simulation but at this time they want await results of the biopsy prior to  scheduling any radiation treatment.  PLAN:final treatment plans pending results of biopsy.     ------------------------------------------------  Blair Promise, PhD, MD

## 2017-12-05 NOTE — Progress Notes (Signed)
Histology and Location of Primary Cancer: Biopsy from 12/04/17 pending. Per Dr. Marin Olp 11/27/17:  I am just puzzled as to what the primary would be.  Maybe this is a lymphoma.  Again if this so, this would be highly treatable..  I would not think this is myeloma given the normal SPEP although light chain disease or non-secretory myeloma can do this.  Sites of Visceral and Bony Metastatic Disease: The CT scan showed a large lytic lesion in the left sacrum.  There was also a lytic lesion in the left pubic ramus.  Location(s) of Symptomatic Metastases: Principle Diagnosis:  Lytic left iliac bone lesion-primary unknown  Past/Anticipated chemotherapy by medical oncology, if any: Per Dr. Marin Olp 11/27/17:  We will find out what the results of the biopsy.  He will need to have Xgeva.  Would like to see him back in 3 weeks.  By then, he will be into his radiation treatments and we will give him the [place].  Pain on a scale of 0-10 is: pt c/o pain 4/10. Back, bottom, and LEFT hip.    If Spine Met(s), symptoms, if any, include:  Bowel/Bladder retention or incontinence (please describe): No  Numbness or weakness in extremities (please describe): No  Current Decadron regimen, if applicable: Pt is not on decadron at this time.   Ambulatory status? Walker? Wheelchair?: pt ambulates with a cane in community and uses a walker at home.  SAFETY ISSUES:  Prior radiation? No  Pacemaker/ICD? No  Possible current pregnancy? N/A  Is the patient on methotrexate? No  Current Complaints / other details:  Pt presents today for initial consult with Dr. Sondra Come for Radiation Oncology. Pt is accompanied by son and wife.   Loma Sousa, RN BSN

## 2017-12-06 ENCOUNTER — Encounter: Payer: Self-pay | Admitting: General Practice

## 2017-12-06 ENCOUNTER — Ambulatory Visit (INDEPENDENT_AMBULATORY_CARE_PROVIDER_SITE_OTHER): Payer: Self-pay | Admitting: Physical Medicine and Rehabilitation

## 2017-12-06 NOTE — Progress Notes (Signed)
[place] Psychosocial Distress Screening Clinical Social Work  Clinical Social Work was referred by distress screening protocol.  The patient scored a 6 on the Psychosocial Distress Thermometer which indicates moderate distress. Clinical Social Worker contacted patient by phone to assess for distress and other psychosocial needs. Reviewed screen, main concern is pain which MD will address.  CSW did speak w patient by phone, brief explanation of role and resources of [place].  Encouraged patient to call as needed.  Patient reports no needs/concerns at this time.    ONCBCN DISTRESS SCREENING 12/05/2017  Screening Type Initial Screening  Distress experienced in past week (1-10) 6  Physical Problem type Pain    Clinical Social Worker follow up needed: No.  If yes, follow up plan:  Beverely Pace, [place], LCSW Clinical Social Worker Phone:  [phone]

## 2017-12-07 ENCOUNTER — Ambulatory Visit: Payer: Medicare Other | Admitting: Radiation Oncology

## 2017-12-07 ENCOUNTER — Ambulatory Visit: Payer: Medicare Other | Admitting: Family Medicine

## 2017-12-07 ENCOUNTER — Ambulatory Visit: Payer: Medicare Other

## 2017-12-08 ENCOUNTER — Ambulatory Visit: Payer: Medicare Other | Admitting: Family Medicine

## 2017-12-10 HISTORY — PX: BONE BIOPSY: SHX375

## 2017-12-13 ENCOUNTER — Ambulatory Visit
Admission: RE | Admit: 2017-12-13 | Discharge: 2017-12-13 | Disposition: A | Discharge status: Home / Self Care | Payer: Medicare Other | Source: Ambulatory Visit | Attending: Radiation Oncology | Admitting: Radiation Oncology

## 2017-12-13 DIAGNOSIS — C8330 Diffuse large B-cell lymphoma, unspecified site: Secondary | ICD-10-CM | POA: Diagnosis not present

## 2017-12-13 DIAGNOSIS — C801 Malignant (primary) neoplasm, unspecified: Secondary | ICD-10-CM

## 2017-12-13 DIAGNOSIS — C7951 Secondary malignant neoplasm of bone: Secondary | ICD-10-CM

## 2017-12-13 DIAGNOSIS — C8336 Diffuse large B-cell lymphoma, intrapelvic lymph nodes: Secondary | ICD-10-CM | POA: Diagnosis not present

## 2017-12-13 DIAGNOSIS — Z510 Encounter for antineoplastic radiation therapy: Secondary | ICD-10-CM | POA: Insufficient documentation

## 2017-12-13 NOTE — Progress Notes (Signed)
  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  NARRATIVE:  The patient was brought to the [facility].  Identity was confirmed.  All relevant records and images related to the planned course of therapy were reviewed.  The patient freely provided informed written consent to proceed with treatment after reviewing the details related to the planned course of therapy. The consent form was witnessed and verified by the simulation staff.  Then, the patient was set-up in a stable reproducible  supine position for radiation therapy.  CT images were obtained.  Surface markings were placed.  The CT images were loaded into the planning software.  Then the target and avoidance structures were contoured.  Treatment planning then occurred.  The radiation prescription was entered and confirmed.  Then, I designed and supervised the construction of a total of 5 medically necessary complex treatment devices.  I have requested : 3D Simulation  I have requested a DVH of the following structures: GTV, PTV, bowel, bladder, and rectum.  I have ordered: dose calculation.  PLAN:  The patient will receive 35 Gy in 14 fractions.  -----------------------------------  Blair Promise, PhD, MD  This document serves as a record of services personally performed by Gery Pray, MD. It was created on his behalf by Wilburn Mylar, a trained medical scribe. The creation of this record is based on the scribe's personal observations and the provider's statements to them. This document has been checked and approved by the attending provider.

## 2017-12-15 ENCOUNTER — Other Ambulatory Visit: Payer: Self-pay

## 2017-12-15 ENCOUNTER — Inpatient Hospital Stay: Payer: Medicare Other | Attending: Hematology & Oncology

## 2017-12-15 ENCOUNTER — Encounter: Payer: Self-pay | Admitting: Hematology & Oncology

## 2017-12-15 ENCOUNTER — Inpatient Hospital Stay (HOSPITAL_BASED_OUTPATIENT_CLINIC_OR_DEPARTMENT_OTHER): Payer: Medicare Other | Admitting: Hematology & Oncology

## 2017-12-15 ENCOUNTER — Inpatient Hospital Stay: Payer: Medicare Other

## 2017-12-15 VITALS — BP 123/68 | HR 76 | Temp 98.4°F | Resp 18 | Wt 159.0 lb

## 2017-12-15 DIAGNOSIS — C7951 Secondary malignant neoplasm of bone: Secondary | ICD-10-CM | POA: Diagnosis not present

## 2017-12-15 DIAGNOSIS — C8330 Diffuse large B-cell lymphoma, unspecified site: Secondary | ICD-10-CM

## 2017-12-15 DIAGNOSIS — C8335 Diffuse large B-cell lymphoma, lymph nodes of inguinal region and lower limb: Secondary | ICD-10-CM

## 2017-12-15 DIAGNOSIS — C801 Malignant (primary) neoplasm, unspecified: Secondary | ICD-10-CM

## 2017-12-15 DIAGNOSIS — Z7189 Other specified counseling: Secondary | ICD-10-CM

## 2017-12-15 DIAGNOSIS — Z79899 Other long term (current) drug therapy: Secondary | ICD-10-CM | POA: Insufficient documentation

## 2017-12-15 DIAGNOSIS — C8580 Other specified types of non-Hodgkin lymphoma, unspecified site: Secondary | ICD-10-CM

## 2017-12-15 DIAGNOSIS — M549 Dorsalgia, unspecified: Secondary | ICD-10-CM | POA: Insufficient documentation

## 2017-12-15 HISTORY — DX: Diffuse large B-cell lymphoma, unspecified site: C83.30

## 2017-12-15 HISTORY — DX: Other specified counseling: Z71.89

## 2017-12-15 LAB — CBC WITH DIFFERENTIAL (CANCER CENTER ONLY)
Basophils Absolute: 0.1 10*3/uL (ref 0.0–0.1)
Basophils Relative: 1 %
Eosinophils Absolute: 0.3 10*3/uL (ref 0.0–0.5)
Eosinophils Relative: 3 %
HEMATOCRIT: 38.1 % — AB (ref 38.7–49.9)
HEMOGLOBIN: 12.7 g/dL — AB (ref 13.0–17.1)
LYMPHS ABS: 1.3 10*3/uL (ref 0.9–3.3)
Lymphocytes Relative: 14 %
MCH: 30.4 pg (ref 28.0–33.4)
MCHC: 33.3 g/dL (ref 32.0–35.9)
MCV: 91.1 fL (ref 82.0–98.0)
MONOS PCT: 12 %
Monocytes Absolute: 1.1 10*3/uL — ABNORMAL HIGH (ref 0.1–0.9)
NEUTROS ABS: 7 10*3/uL — AB (ref 1.5–6.5)
NEUTROS PCT: 70 %
Platelet Count: 210 10*3/uL (ref 145–400)
RBC: 4.18 MIL/uL — AB (ref 4.20–5.70)
RDW: 13.5 % (ref 11.1–15.7)
WBC: 9.8 10*3/uL (ref 4.0–10.0)

## 2017-12-15 LAB — CMP (CANCER CENTER ONLY)
ALBUMIN: 3.6 g/dL (ref 3.5–5.0)
ALK PHOS: 83 U/L (ref 26–84)
ALT: 17 U/L (ref 10–47)
ANION GAP: 1 — AB (ref 5–15)
AST: 23 U/L (ref 11–38)
BILIRUBIN TOTAL: 0.9 mg/dL (ref 0.2–1.6)
BUN: 24 mg/dL — AB (ref 7–22)
CALCIUM: 9.8 mg/dL (ref 8.0–10.3)
CO2: 29 mmol/L (ref 18–33)
CREATININE: 1.5 mg/dL — AB (ref 0.60–1.20)
Chloride: 105 mmol/L (ref 98–108)
GLUCOSE: 128 mg/dL — AB (ref 73–118)
POTASSIUM: 4.2 mmol/L (ref 3.3–4.7)
Sodium: 135 mmol/L (ref 128–145)
Total Protein: 6.6 g/dL (ref 6.4–8.1)

## 2017-12-15 MED ORDER — ALLOPURINOL 100 MG PO TABS: 100.0000 mg | ORAL_TABLET | Freq: Every day | ORAL | 0 refills | Status: DC

## 2017-12-15 MED ORDER — FAMCICLOVIR 250 MG PO TABS: 250.0000 mg | ORAL_TABLET | Freq: Every day | ORAL | 8 refills | Status: DC

## 2017-12-15 MED ORDER — DENOSUMAB 120 MG/1.7ML ~~LOC~~ SOLN
120.0000 mg | Freq: Once | SUBCUTANEOUS | Status: AC
  Administered 2017-12-15: 120 mg via SUBCUTANEOUS

## 2017-12-15 NOTE — Progress Notes (Signed)
Hematology and Oncology Follow Up Visit  MEKHAI VENUTO [id number] [birth year] 84 y.o. 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 is back for follow-up.  We now have a diagnosis.  He underwent a bone biopsy on 12/04/2017.  Shockingly, the pathology report [id number]) shows a diffuse large B-cell lymphoma.  It is CD20 positive.  His BCL-2 and BCL-6 (+).   Again, I am quite surprised by this.  I referred him to radiation oncology.  He has seen radiation.  He will start radiation therapy next week.  According to his son, it sounds like he will get 3 weeks of therapy.  He is 84 years old.  He is not a candidate for anything aggressive.  I doubt that we are going to to be able to cure this lymphoma.  I think our goal is to try to control it so that it will not cause him problems.   I think that a good option for him would be Rituxan/bendamustine.  I think he would be able to tolerate this.  It would be reasonable to use this protocol.  I talked to he and his son at length.  I spent about 45 minutes with him.  I talked to them about the fact that this is stage IV lymphoma given that it is in his bones.  He does not need a bone marrow biopsy.  I do think a PET scan would help [place] out.  We could see the extent of his disease in the bones.  He has had no nausea or vomiting.  He has had no change in bowel or bladder habits.  He really is not complaining much in the way of pain.  Overall, his performance status is ECOG 3, at best.   Medications:  Current Outpatient Medications:  .  [START ON 01/12/2018] allopurinol (ZYLOPRIM) 100 MG tablet, Take 1 tablet (100 mg total) by mouth daily., Disp: 30 tablet, Rfl: 0 .  amLODipine (NORVASC) 10 MG tablet, Take 1 tablet (10 mg total) by mouth daily.,  Disp: 90 tablet, Rfl: 0 .  atenolol (TENORMIN) 100 MG tablet, Take 1 tablet (100 mg total) by mouth daily., Disp: 90 tablet, Rfl: 0 .  atorvastatin (LIPITOR) 10 MG tablet, TAKE ONE TABLET BY MOUTH ONCE DAILY (Patient not taking: Reported on 11/22/2017), Disp: 90 tablet, Rfl: 0 .  cholecalciferol (VITAMIN D) 1000 UNITS tablet, Take 1,000 Units by mouth daily., Disp: , Rfl:  .  clopidogrel (PLAVIX) 75 MG tablet, TAKE 1 TABLET BY MOUTH DAILY (Patient not taking: Reported on 12/05/2017), Disp: 90 tablet, Rfl: 1 .  diclofenac sodium (VOLTAREN) 1 % GEL, Apply 4 g topically 4 (four) times daily. (Patient not taking: Reported on 11/22/2017), Disp: 100 g, Rfl: 0 .  [START ON 01/12/2018] famciclovir (FAMVIR) 250 MG tablet, Take 1 tablet (250 mg total) by mouth daily., Disp: 30 tablet, Rfl: 8 .  hydrochlorothiazide (MICROZIDE) 12.5 MG capsule, Take 1 capsule (12.5 mg total) by mouth daily. (Patient not taking: Reported on 11/22/2017), Disp: 90 capsule, Rfl: 3 .  oxyCODONE (OXY IR/ROXICODONE) 5 MG immediate release tablet, 1-2 tabs po qid prn pain, Disp: 84 tablet, Rfl: 0 .  vitamin C (ASCORBIC ACID) 500 MG tablet, Take 500 mg by mouth daily., Disp: ,  Rfl:  .  vitamin E 400 UNIT capsule, Take 400 Units by mouth every other day., Disp: , Rfl:   Allergies: No Known Allergies  Past Medical History, Surgical history, Social history, and Family History were reviewed and updated.  Review of Systems: Review of Systems  Constitutional: Negative.   HENT:  Negative.   Eyes: Negative.   Respiratory: Negative.   Cardiovascular: Negative.   Gastrointestinal: Negative.   Endocrine: Negative.   Genitourinary: Negative.    Musculoskeletal: Positive for back pain and myalgias.  Skin: Negative.   Neurological: Negative.   Hematological: Negative.   Psychiatric/Behavioral: Negative.     Physical Exam:  weight is 159 lb (72.1 kg). His oral temperature is 98.4 F (36.9 C). His blood pressure is 123/68 and his pulse is  76. His respiration is 18 and oxygen saturation is 97%.   Wt Readings from Last 3 Encounters:  12/15/17 159 lb (72.1 kg)  12/05/17 159 lb 3.2 oz (72.2 kg)  12/04/17 165 lb (74.8 kg)    Physical Exam  Constitutional: He is oriented to person, place, and time.  HENT:  Head: Normocephalic and atraumatic.  Mouth/Throat: Oropharynx is clear and moist.  Eyes: Pupils are equal, round, and reactive to light. EOM are normal.  Neck: Normal range of motion.  Cardiovascular: Normal rate, regular rhythm and normal heart sounds.  Pulmonary/Chest: Effort normal and breath sounds normal.  Abdominal: Soft. Bowel sounds are normal.  Musculoskeletal: Normal range of motion. He exhibits no edema, tenderness or deformity.  Lymphadenopathy:    He has no cervical adenopathy.  Neurological: He is alert and oriented to person, place, and time.  Skin: Skin is warm and dry. No rash noted. No erythema.  Psychiatric: He has a normal mood and affect. His behavior is normal. Judgment and thought content normal.  Vitals reviewed.    Lab Results  Component Value Date   WBC 9.8 12/15/2017   HGB 12.7 (L) 12/15/2017   HCT 38.1 (L) 12/15/2017   MCV 91.1 12/15/2017   PLT 210 12/15/2017     Chemistry      Component Value Date/Time   NA 135 12/15/2017 1440   K 4.2 12/15/2017 1440   CL 105 12/15/2017 1440   CO2 29 12/15/2017 1440   BUN 24 (H) 12/15/2017 1440   CREATININE 1.50 (H) 12/15/2017 1440   CREATININE 1.22 (H) 08/03/2017 1518      Component Value Date/Time   CALCIUM 9.8 12/15/2017 1440   ALKPHOS 83 12/15/2017 1440   AST 23 12/15/2017 1440   ALT 17 12/15/2017 1440   BILITOT 0.9 12/15/2017 1440      Impression and Plan: Mr. Weatherholtz is a 84 year old white male who has a large destructive lesion in the left sacrum.   He has a diffuse large B-cell lymphoma.  This appears to be confined to his bones by his x-ray studies so far.  Again, we will get a PET scan and see how that looks.  I do think  that we should have him get radiation therapy first.  I think this is important for [place] to do.  Since we really are not able to give him aggressive chemotherapy, I think that localized radiation to the left sacral lesion would be a good way to start.  Again, we have to consider his quality of life.  Our goal of care is basically to allow him not to have problems with these bones involved with lymphoma.  I want to try to avoid fractures.  Hopefully, we will be able to maintain his quality of life at a decent level.  I gave he and his son information sheets about the Rituxan and bendamustine.  I went over the protocol.  I explained the side effects.  I do think that he would be able to tolerate this okay.  We will try to get started with treatment in early October.  He will finish his radiation the end of September.  I spent about 45 minutes with he and his son.  All the time was spent face-to-face with them.  I counseled them and help coordinate all of his care.  I answered all their questions.  I would like to see him back the day that we start his treatments in early October.   Volanda Napoleon, MD 9/6/20195:57 PM

## 2017-12-15 NOTE — Patient Instructions (Signed)

## 2017-12-18 ENCOUNTER — Encounter: Payer: Self-pay | Admitting: Family Medicine

## 2017-12-18 DIAGNOSIS — C8336 Diffuse large B-cell lymphoma, intrapelvic lymph nodes: Secondary | ICD-10-CM | POA: Diagnosis not present

## 2017-12-18 DIAGNOSIS — C8330 Diffuse large B-cell lymphoma, unspecified site: Secondary | ICD-10-CM | POA: Diagnosis not present

## 2017-12-18 DIAGNOSIS — Z510 Encounter for antineoplastic radiation therapy: Secondary | ICD-10-CM | POA: Diagnosis not present

## 2017-12-19 ENCOUNTER — Ambulatory Visit: Payer: Medicare Other | Admitting: Family Medicine

## 2017-12-19 ENCOUNTER — Ambulatory Visit
Admission: RE | Admit: 2017-12-19 | Discharge: 2017-12-19 | Disposition: A | Discharge status: Home / Self Care | Payer: Medicare Other | Source: Ambulatory Visit | Attending: Radiation Oncology | Admitting: Radiation Oncology

## 2017-12-19 DIAGNOSIS — C8330 Diffuse large B-cell lymphoma, unspecified site: Secondary | ICD-10-CM

## 2017-12-19 DIAGNOSIS — C8336 Diffuse large B-cell lymphoma, intrapelvic lymph nodes: Secondary | ICD-10-CM | POA: Diagnosis not present

## 2017-12-19 DIAGNOSIS — C8580 Other specified types of non-Hodgkin lymphoma, unspecified site: Principal | ICD-10-CM

## 2017-12-19 DIAGNOSIS — Z510 Encounter for antineoplastic radiation therapy: Secondary | ICD-10-CM | POA: Diagnosis not present

## 2017-12-19 NOTE — Progress Notes (Signed)
  Radiation Oncology         [phone] ________________________________  Name: SEVON ROTERT MRN: [id number]  Date: 12/19/2017  DOB: 1920-02-03  Simulation Verification Note    ICD-10-CM   1. Diffuse large cell non-Hodgkin's lymphoma (HCC) C85.80     Status: outpatient  NARRATIVE: The patient was brought to the treatment unit and placed in the planned treatment position. The clinical setup was verified. Then port films were obtained and uploaded to the radiation oncology medical record software.  The treatment beams were carefully compared against the planned radiation fields. The position location and shape of the radiation fields was reviewed. They targeted volume of tissue appears to be appropriately covered by the radiation beams. Organs at risk appear to be excluded as planned.  Based on my personal review, I approved the simulation verification. The patient's treatment will proceed as planned.  -----------------------------------  Blair Promise, PhD, MD

## 2017-12-20 ENCOUNTER — Ambulatory Visit
Admission: RE | Admit: 2017-12-20 | Discharge: 2017-12-20 | Disposition: A | Discharge status: Home / Self Care | Payer: Medicare Other | Source: Ambulatory Visit | Attending: Radiation Oncology | Admitting: Radiation Oncology

## 2017-12-20 DIAGNOSIS — C8336 Diffuse large B-cell lymphoma, intrapelvic lymph nodes: Secondary | ICD-10-CM | POA: Diagnosis not present

## 2017-12-20 DIAGNOSIS — Z510 Encounter for antineoplastic radiation therapy: Secondary | ICD-10-CM | POA: Diagnosis not present

## 2017-12-20 DIAGNOSIS — C8330 Diffuse large B-cell lymphoma, unspecified site: Secondary | ICD-10-CM | POA: Diagnosis not present

## 2017-12-21 ENCOUNTER — Ambulatory Visit: Payer: Medicare Other | Admitting: Radiation Oncology

## 2017-12-21 ENCOUNTER — Ambulatory Visit
Admission: RE | Admit: 2017-12-21 | Discharge: 2017-12-21 | Disposition: A | Discharge status: Home / Self Care | Payer: Medicare Other | Source: Ambulatory Visit | Attending: Radiation Oncology | Admitting: Radiation Oncology

## 2017-12-21 DIAGNOSIS — Z510 Encounter for antineoplastic radiation therapy: Secondary | ICD-10-CM | POA: Diagnosis not present

## 2017-12-21 DIAGNOSIS — C8336 Diffuse large B-cell lymphoma, intrapelvic lymph nodes: Secondary | ICD-10-CM | POA: Diagnosis not present

## 2017-12-21 DIAGNOSIS — C8330 Diffuse large B-cell lymphoma, unspecified site: Secondary | ICD-10-CM | POA: Diagnosis not present

## 2017-12-22 ENCOUNTER — Ambulatory Visit
Admission: RE | Admit: 2017-12-22 | Discharge: 2017-12-22 | Disposition: A | Discharge status: Home / Self Care | Payer: Medicare Other | Source: Ambulatory Visit | Attending: Radiation Oncology | Admitting: Radiation Oncology

## 2017-12-22 DIAGNOSIS — Z510 Encounter for antineoplastic radiation therapy: Secondary | ICD-10-CM | POA: Diagnosis not present

## 2017-12-22 DIAGNOSIS — C8330 Diffuse large B-cell lymphoma, unspecified site: Secondary | ICD-10-CM | POA: Diagnosis not present

## 2017-12-22 DIAGNOSIS — C8336 Diffuse large B-cell lymphoma, intrapelvic lymph nodes: Secondary | ICD-10-CM | POA: Diagnosis not present

## 2017-12-25 ENCOUNTER — Ambulatory Visit
Admission: RE | Admit: 2017-12-25 | Discharge: 2017-12-25 | Disposition: A | Discharge status: Home / Self Care | Payer: Medicare Other | Source: Ambulatory Visit | Attending: Radiation Oncology | Admitting: Radiation Oncology

## 2017-12-25 DIAGNOSIS — Z510 Encounter for antineoplastic radiation therapy: Secondary | ICD-10-CM | POA: Diagnosis not present

## 2017-12-25 DIAGNOSIS — C8330 Diffuse large B-cell lymphoma, unspecified site: Secondary | ICD-10-CM | POA: Diagnosis not present

## 2017-12-25 DIAGNOSIS — C8336 Diffuse large B-cell lymphoma, intrapelvic lymph nodes: Secondary | ICD-10-CM | POA: Diagnosis not present

## 2017-12-26 ENCOUNTER — Ambulatory Visit
Admission: RE | Admit: 2017-12-26 | Discharge: 2017-12-26 | Disposition: A | Discharge status: Home / Self Care | Payer: Medicare Other | Source: Ambulatory Visit | Attending: Radiation Oncology | Admitting: Radiation Oncology

## 2017-12-26 DIAGNOSIS — Z510 Encounter for antineoplastic radiation therapy: Secondary | ICD-10-CM | POA: Diagnosis not present

## 2017-12-26 DIAGNOSIS — C8336 Diffuse large B-cell lymphoma, intrapelvic lymph nodes: Secondary | ICD-10-CM | POA: Diagnosis not present

## 2017-12-26 DIAGNOSIS — C8330 Diffuse large B-cell lymphoma, unspecified site: Secondary | ICD-10-CM | POA: Diagnosis not present

## 2017-12-27 ENCOUNTER — Ambulatory Visit
Admission: RE | Admit: 2017-12-27 | Discharge: 2017-12-27 | Disposition: A | Discharge status: Home / Self Care | Payer: Medicare Other | Source: Ambulatory Visit | Attending: Radiation Oncology | Admitting: Radiation Oncology

## 2017-12-27 DIAGNOSIS — Z510 Encounter for antineoplastic radiation therapy: Secondary | ICD-10-CM | POA: Diagnosis not present

## 2017-12-27 DIAGNOSIS — C8330 Diffuse large B-cell lymphoma, unspecified site: Secondary | ICD-10-CM | POA: Diagnosis not present

## 2017-12-27 DIAGNOSIS — C8336 Diffuse large B-cell lymphoma, intrapelvic lymph nodes: Secondary | ICD-10-CM | POA: Diagnosis not present

## 2017-12-28 ENCOUNTER — Ambulatory Visit (HOSPITAL_COMMUNITY)
Admission: RE | Admit: 2017-12-28 | Discharge: 2017-12-28 | Disposition: A | Discharge status: Home / Self Care | Payer: Medicare Other | Source: Ambulatory Visit | Attending: Hematology & Oncology | Admitting: Hematology & Oncology

## 2017-12-28 ENCOUNTER — Ambulatory Visit
Admission: RE | Admit: 2017-12-28 | Discharge: 2017-12-28 | Disposition: A | Discharge status: Home / Self Care | Payer: Medicare Other | Source: Ambulatory Visit | Attending: Radiation Oncology | Admitting: Radiation Oncology

## 2017-12-28 ENCOUNTER — Encounter (HOSPITAL_COMMUNITY): Payer: Self-pay

## 2017-12-28 DIAGNOSIS — C8336 Diffuse large B-cell lymphoma, intrapelvic lymph nodes: Secondary | ICD-10-CM | POA: Diagnosis not present

## 2017-12-28 DIAGNOSIS — I2510 Atherosclerotic heart disease of native coronary artery without angina pectoris: Secondary | ICD-10-CM | POA: Diagnosis not present

## 2017-12-28 DIAGNOSIS — I700 Atherosclerosis of aorta: Secondary | ICD-10-CM | POA: Insufficient documentation

## 2017-12-28 DIAGNOSIS — C8335 Diffuse large B-cell lymphoma, lymph nodes of inguinal region and lower limb: Secondary | ICD-10-CM | POA: Diagnosis not present

## 2017-12-28 DIAGNOSIS — C8580 Other specified types of non-Hodgkin lymphoma, unspecified site: Secondary | ICD-10-CM | POA: Diagnosis not present

## 2017-12-28 DIAGNOSIS — C8330 Diffuse large B-cell lymphoma, unspecified site: Secondary | ICD-10-CM | POA: Diagnosis not present

## 2017-12-28 DIAGNOSIS — Z510 Encounter for antineoplastic radiation therapy: Secondary | ICD-10-CM | POA: Diagnosis not present

## 2017-12-28 LAB — GLUCOSE, CAPILLARY: GLUCOSE-CAPILLARY: 94 mg/dL (ref 70–99)

## 2017-12-28 MED ORDER — FLUDEOXYGLUCOSE F - 18 (FDG) INJECTION
7.9000 | Freq: Once | INTRAVENOUS | Status: AC
  Administered 2017-12-28: 7.9 via INTRAVENOUS

## 2017-12-29 ENCOUNTER — Telehealth: Payer: Self-pay | Admitting: *Deleted

## 2017-12-29 ENCOUNTER — Ambulatory Visit
Admission: RE | Admit: 2017-12-29 | Discharge: 2017-12-29 | Disposition: A | Discharge status: Home / Self Care | Payer: Medicare Other | Source: Ambulatory Visit | Attending: Radiation Oncology | Admitting: Radiation Oncology

## 2017-12-29 DIAGNOSIS — Z510 Encounter for antineoplastic radiation therapy: Secondary | ICD-10-CM | POA: Diagnosis not present

## 2017-12-29 DIAGNOSIS — C8336 Diffuse large B-cell lymphoma, intrapelvic lymph nodes: Secondary | ICD-10-CM | POA: Diagnosis not present

## 2017-12-29 DIAGNOSIS — C8330 Diffuse large B-cell lymphoma, unspecified site: Secondary | ICD-10-CM | POA: Diagnosis not present

## 2017-12-29 NOTE — Telephone Encounter (Signed)
Message left on pt.'s private answering machine to notify him per order of Dr. Marin Olp that PET scan shows only a few bone lesions.  Liver, lungs, lymph nodes are normal.  Instructed pt to call office back with any questions or concerns.

## 2017-12-29 NOTE — Telephone Encounter (Signed)
-----   Message from Volanda Napoleon, MD sent at 12/28/2017  6:24 PM EDT ----- Call - the PET scan shows only a few bone lesions.  Liver, lungs, lymph nodes are normal!!  Laurey Arrow

## 2018-01-01 ENCOUNTER — Ambulatory Visit
Admission: RE | Admit: 2018-01-01 | Discharge: 2018-01-01 | Disposition: A | Discharge status: Home / Self Care | Payer: Medicare Other | Source: Ambulatory Visit | Attending: Radiation Oncology | Admitting: Radiation Oncology

## 2018-01-01 DIAGNOSIS — Z510 Encounter for antineoplastic radiation therapy: Secondary | ICD-10-CM | POA: Diagnosis not present

## 2018-01-01 DIAGNOSIS — C8330 Diffuse large B-cell lymphoma, unspecified site: Secondary | ICD-10-CM | POA: Diagnosis not present

## 2018-01-01 DIAGNOSIS — C8336 Diffuse large B-cell lymphoma, intrapelvic lymph nodes: Secondary | ICD-10-CM | POA: Diagnosis not present

## 2018-01-02 ENCOUNTER — Ambulatory Visit
Admission: RE | Admit: 2018-01-02 | Discharge: 2018-01-02 | Disposition: A | Discharge status: Home / Self Care | Payer: Medicare Other | Source: Ambulatory Visit | Attending: Radiation Oncology | Admitting: Radiation Oncology

## 2018-01-02 DIAGNOSIS — C8336 Diffuse large B-cell lymphoma, intrapelvic lymph nodes: Secondary | ICD-10-CM | POA: Diagnosis not present

## 2018-01-02 DIAGNOSIS — C8330 Diffuse large B-cell lymphoma, unspecified site: Secondary | ICD-10-CM | POA: Diagnosis not present

## 2018-01-02 DIAGNOSIS — Z510 Encounter for antineoplastic radiation therapy: Secondary | ICD-10-CM | POA: Diagnosis not present

## 2018-01-03 ENCOUNTER — Ambulatory Visit
Admission: RE | Admit: 2018-01-03 | Discharge: 2018-01-03 | Disposition: A | Discharge status: Home / Self Care | Payer: Medicare Other | Source: Ambulatory Visit | Attending: Radiation Oncology | Admitting: Radiation Oncology

## 2018-01-03 DIAGNOSIS — C8330 Diffuse large B-cell lymphoma, unspecified site: Secondary | ICD-10-CM | POA: Diagnosis not present

## 2018-01-03 DIAGNOSIS — Z510 Encounter for antineoplastic radiation therapy: Secondary | ICD-10-CM | POA: Diagnosis not present

## 2018-01-03 DIAGNOSIS — C8336 Diffuse large B-cell lymphoma, intrapelvic lymph nodes: Secondary | ICD-10-CM | POA: Diagnosis not present

## 2018-01-04 ENCOUNTER — Ambulatory Visit
Admission: RE | Admit: 2018-01-04 | Discharge: 2018-01-04 | Disposition: A | Discharge status: Home / Self Care | Payer: Medicare Other | Source: Ambulatory Visit | Attending: Radiation Oncology | Admitting: Radiation Oncology

## 2018-01-04 DIAGNOSIS — C8330 Diffuse large B-cell lymphoma, unspecified site: Secondary | ICD-10-CM | POA: Diagnosis not present

## 2018-01-04 DIAGNOSIS — C8336 Diffuse large B-cell lymphoma, intrapelvic lymph nodes: Secondary | ICD-10-CM | POA: Diagnosis not present

## 2018-01-04 DIAGNOSIS — Z510 Encounter for antineoplastic radiation therapy: Secondary | ICD-10-CM | POA: Diagnosis not present

## 2018-01-05 ENCOUNTER — Ambulatory Visit: Payer: Medicare Other

## 2018-01-05 ENCOUNTER — Ambulatory Visit
Admission: RE | Admit: 2018-01-05 | Discharge: 2018-01-05 | Disposition: A | Discharge status: Home / Self Care | Payer: Medicare Other | Source: Ambulatory Visit | Attending: Radiation Oncology | Admitting: Radiation Oncology

## 2018-01-05 DIAGNOSIS — Z510 Encounter for antineoplastic radiation therapy: Secondary | ICD-10-CM | POA: Diagnosis not present

## 2018-01-05 DIAGNOSIS — C8336 Diffuse large B-cell lymphoma, intrapelvic lymph nodes: Secondary | ICD-10-CM | POA: Diagnosis not present

## 2018-01-05 DIAGNOSIS — C8330 Diffuse large B-cell lymphoma, unspecified site: Secondary | ICD-10-CM | POA: Diagnosis not present

## 2018-01-08 ENCOUNTER — Ambulatory Visit: Payer: Medicare Other

## 2018-01-09 ENCOUNTER — Ambulatory Visit: Payer: Medicare Other

## 2018-01-10 ENCOUNTER — Encounter: Payer: Self-pay | Admitting: Radiation Oncology

## 2018-01-10 NOTE — Progress Notes (Signed)
  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  He denied dysuria or hematuria, issues with his bowel, and any skin issues and reported nocturia x3 throughout treatment. He noted diarrhea and was recommended imodium. He reported resolving pain and increasing fatigue as treatments went on.   Plan: The patient has completed radiation treatment. The patient will return to radiation oncology clinic for routine followup in one month. I advised them to call or return sooner if they have any questions or concerns related to their recovery or treatment.  -----------------------------------  Blair Promise, PhD, MD  This document serves as a record of services personally performed by Gery Pray, MD. It was created on his behalf by Wilburn Mylar, a trained medical scribe. The creation of this record is based on the scribe's personal observations and the provider's statements to them. This document has been checked and approved by the attending provider.

## 2018-01-12 ENCOUNTER — Other Ambulatory Visit: Payer: Self-pay | Admitting: Family Medicine

## 2018-01-12 MED ORDER — AMLODIPINE BESYLATE 10 MG PO TABS: 10.0000 mg | ORAL_TABLET | Freq: Every day | ORAL | 1 refills | Status: DC

## 2018-01-12 MED ORDER — ATENOLOL 100 MG PO TABS: 100.0000 mg | ORAL_TABLET | Freq: Every day | ORAL | 1 refills | Status: DC

## 2018-01-12 NOTE — Telephone Encounter (Signed)
Copied from [facility] [id number]. Topic: Quick Communication - Rx Refill/Question >> Jan 12, 2018  3:45 PM Bea Graff, NT wrote: Medication: amLODipine (NORVASC) 10 MG tablet and atenolol (TENORMIN) 100 MG tablet   Has the patient contacted their pharmacy? Yes.   (Agent: If no, request that the patient contact the pharmacy for the refill.) (Agent: If yes, when and what did the pharmacy advise?)  Preferred Pharmacy (with phone number or street name): [id number] - [place], [place] [phone] (Phone) [phone] (Fax)    Agent: Please be advised that RX refills may take up to 3 business days. We ask that you follow-up with your pharmacy.

## 2018-01-15 ENCOUNTER — Inpatient Hospital Stay (HOSPITAL_BASED_OUTPATIENT_CLINIC_OR_DEPARTMENT_OTHER): Payer: Medicare Other | Admitting: Hematology & Oncology

## 2018-01-15 ENCOUNTER — Inpatient Hospital Stay: Payer: Medicare Other

## 2018-01-15 ENCOUNTER — Inpatient Hospital Stay: Payer: Medicare Other | Attending: Hematology & Oncology

## 2018-01-15 ENCOUNTER — Encounter: Payer: Self-pay | Admitting: *Deleted

## 2018-01-15 VITALS — BP 126/65 | HR 62 | Temp 97.7°F | Resp 17 | Wt 157.8 lb

## 2018-01-15 VITALS — BP 124/68 | HR 68 | Temp 98.0°F | Resp 18

## 2018-01-15 DIAGNOSIS — C7951 Secondary malignant neoplasm of bone: Secondary | ICD-10-CM | POA: Insufficient documentation

## 2018-01-15 DIAGNOSIS — C8335 Diffuse large B-cell lymphoma, lymph nodes of inguinal region and lower limb: Secondary | ICD-10-CM

## 2018-01-15 DIAGNOSIS — Z923 Personal history of irradiation: Secondary | ICD-10-CM | POA: Diagnosis not present

## 2018-01-15 DIAGNOSIS — M791 Myalgia, unspecified site: Secondary | ICD-10-CM | POA: Diagnosis not present

## 2018-01-15 DIAGNOSIS — C8580 Other specified types of non-Hodgkin lymphoma, unspecified site: Principal | ICD-10-CM

## 2018-01-15 DIAGNOSIS — Z79899 Other long term (current) drug therapy: Secondary | ICD-10-CM | POA: Diagnosis not present

## 2018-01-15 DIAGNOSIS — M549 Dorsalgia, unspecified: Secondary | ICD-10-CM

## 2018-01-15 DIAGNOSIS — C8330 Diffuse large B-cell lymphoma, unspecified site: Secondary | ICD-10-CM

## 2018-01-15 DIAGNOSIS — C801 Malignant (primary) neoplasm, unspecified: Principal | ICD-10-CM

## 2018-01-15 DIAGNOSIS — Z5111 Encounter for antineoplastic chemotherapy: Secondary | ICD-10-CM | POA: Insufficient documentation

## 2018-01-15 LAB — CMP (CANCER CENTER ONLY)
ALBUMIN: 3.5 g/dL (ref 3.5–5.0)
ALT: 14 U/L (ref 10–47)
ANION GAP: 4 — AB (ref 5–15)
AST: 18 U/L (ref 11–38)
Alkaline Phosphatase: 95 U/L — ABNORMAL HIGH (ref 26–84)
BILIRUBIN TOTAL: 0.7 mg/dL (ref 0.2–1.6)
BUN: 15 mg/dL (ref 7–22)
CALCIUM: 9.6 mg/dL (ref 8.0–10.3)
CO2: 29 mmol/L (ref 18–33)
CREATININE: 1.1 mg/dL (ref 0.60–1.20)
Chloride: 104 mmol/L (ref 98–108)
Glucose, Bld: 134 mg/dL — ABNORMAL HIGH (ref 73–118)
Potassium: 4.1 mmol/L (ref 3.3–4.7)
Sodium: 137 mmol/L (ref 128–145)
TOTAL PROTEIN: 6.4 g/dL (ref 6.4–8.1)

## 2018-01-15 LAB — CBC WITH DIFFERENTIAL (CANCER CENTER ONLY)
BASOS ABS: 0.1 10*3/uL (ref 0.0–0.1)
Basophils Relative: 1 %
EOS PCT: 5 %
Eosinophils Absolute: 0.4 10*3/uL (ref 0.0–0.5)
HEMATOCRIT: 39.1 % (ref 38.7–49.9)
HEMOGLOBIN: 12.8 g/dL — AB (ref 13.0–17.1)
LYMPHS PCT: 11 %
Lymphs Abs: 0.8 10*3/uL — ABNORMAL LOW (ref 0.9–3.3)
MCH: 30.4 pg (ref 28.0–33.4)
MCHC: 32.7 g/dL (ref 32.0–35.9)
MCV: 92.9 fL (ref 82.0–98.0)
MONO ABS: 0.8 10*3/uL (ref 0.1–0.9)
MONOS PCT: 12 %
NEUTROS ABS: 5.1 10*3/uL (ref 1.5–6.5)
Neutrophils Relative %: 71 %
Platelet Count: 194 10*3/uL (ref 145–400)
RBC: 4.21 MIL/uL (ref 4.20–5.70)
RDW: 14.4 % (ref 11.1–15.7)
WBC Count: 7.1 10*3/uL (ref 4.0–10.0)

## 2018-01-15 LAB — LACTATE DEHYDROGENASE: LDH: 163 U/L (ref 98–192)

## 2018-01-15 MED ORDER — DEXAMETHASONE SODIUM PHOSPHATE 10 MG/ML IJ SOLN
10.0000 mg | Freq: Once | INTRAMUSCULAR | Status: AC
  Administered 2018-01-15: 10 mg via INTRAVENOUS

## 2018-01-15 MED ORDER — DEXAMETHASONE 4 MG PO TABS: 8.0000 mg | ORAL_TABLET | Freq: Every day | ORAL | 1 refills | Status: DC

## 2018-01-15 MED ORDER — ACETAMINOPHEN 325 MG PO TABS
ORAL_TABLET | ORAL | Status: AC
  Filled 2018-01-15: qty 2

## 2018-01-15 MED ORDER — DIPHENHYDRAMINE HCL 25 MG PO CAPS
50.0000 mg | ORAL_CAPSULE | Freq: Once | ORAL | Status: AC
  Administered 2018-01-15: 50 mg via ORAL

## 2018-01-15 MED ORDER — DEXAMETHASONE SODIUM PHOSPHATE 10 MG/ML IJ SOLN
INTRAMUSCULAR | Status: AC
  Filled 2018-01-15: qty 1

## 2018-01-15 MED ORDER — SODIUM CHLORIDE 0.9 % IV SOLN
72.0000 mg/m2 | Freq: Once | INTRAVENOUS | Status: AC
  Administered 2018-01-15: 125 mg via INTRAVENOUS
  Filled 2018-01-15: qty 5

## 2018-01-15 MED ORDER — PROCHLORPERAZINE MALEATE 10 MG PO TABS: 10.0000 mg | ORAL_TABLET | Freq: Four times a day (QID) | ORAL | 1 refills | Status: DC | PRN

## 2018-01-15 MED ORDER — SODIUM CHLORIDE 0.9 % IV SOLN
375.0000 mg/m2 | Freq: Once | INTRAVENOUS | Status: AC
  Administered 2018-01-15: 700 mg via INTRAVENOUS
  Filled 2018-01-15: qty 50

## 2018-01-15 MED ORDER — ACETAMINOPHEN 325 MG PO TABS
650.0000 mg | ORAL_TABLET | Freq: Once | ORAL | Status: AC
  Administered 2018-01-15: 650 mg via ORAL

## 2018-01-15 MED ORDER — PALONOSETRON HCL INJECTION 0.25 MG/5ML
0.2500 mg | Freq: Once | INTRAVENOUS | Status: AC
  Administered 2018-01-15: 0.25 mg via INTRAVENOUS

## 2018-01-15 MED ORDER — PALONOSETRON HCL INJECTION 0.25 MG/5ML
INTRAVENOUS | Status: AC
  Filled 2018-01-15: qty 5

## 2018-01-15 MED ORDER — SODIUM CHLORIDE 0.9 % IV SOLN
Freq: Once | INTRAVENOUS | Status: AC
  Administered 2018-01-15: 09:00:00 via INTRAVENOUS
  Filled 2018-01-15: qty 250

## 2018-01-15 MED ORDER — DIPHENHYDRAMINE HCL 25 MG PO CAPS
ORAL_CAPSULE | ORAL | Status: AC
  Filled 2018-01-15: qty 2

## 2018-01-15 MED ORDER — ONDANSETRON HCL 8 MG PO TABS: 8.0000 mg | ORAL_TABLET | Freq: Two times a day (BID) | ORAL | 1 refills | Status: DC | PRN

## 2018-01-15 NOTE — Progress Notes (Signed)
Hematology and Oncology Follow Up Visit  Brendan Gonzalez [id number] 27-Dec-1919 84 y.o. 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  We now have a diagnosis.  He underwent a bone biopsy on 12/04/2017.  Shockingly, the pathology report [id number]) shows a diffuse large B-cell lymphoma.  It is CD20 positive.  His BCL-2 and BCL-6 (+).   We did get a bone scan on him.  This was done on September 19.  Thankfully, the PET scan only showed involvement of his bones.  He is had radiation therapy.  There does not seem to be nearly as much pain in the right hip.  I think that he also got some radiation elsewhere.  We now we will start him on chemotherapy.  Given his incredibly advanced age, we will have to be very cautious.  Rituxan/bendamustine is a good option for him.  I know that we are not going to cure this but if we can at least control it, he will have a good quality of life.   His appetite is okay.  He is having no nausea or vomiting.  He is having no diarrhea.  Overall, I would have to say that his performance status is ECOG 2 at best.   Medications:  Current Outpatient Medications:  .  allopurinol (ZYLOPRIM) 100 MG tablet, Take 1 tablet (100 mg total) by mouth daily., Disp: 30 tablet, Rfl: 0 .  amLODipine (NORVASC) 10 MG tablet, Take 1 tablet (10 mg total) by mouth daily., Disp: 90 tablet, Rfl: 1 .  atenolol (TENORMIN) 100 MG tablet, Take 1 tablet (100 mg total) by mouth daily., Disp: 90 tablet, Rfl: 1 .  atorvastatin (LIPITOR) 10 MG tablet, TAKE ONE TABLET BY MOUTH ONCE DAILY (Patient not taking: Reported on 11/22/2017), Disp: 90 tablet, Rfl: 0 .  cholecalciferol (VITAMIN D) 1000 UNITS tablet, Take 1,000 Units by mouth daily., Disp: , Rfl:  .  clopidogrel  (PLAVIX) 75 MG tablet, TAKE 1 TABLET BY MOUTH DAILY (Patient not taking: Reported on 12/05/2017), Disp: 90 tablet, Rfl: 1 .  dexamethasone (DECADRON) 4 MG tablet, Take 2 tablets (8 mg total) by mouth daily. Start the day after bendamustine chemotherapy for 2 days. Take with food., Disp: 30 tablet, Rfl: 1 .  diclofenac sodium (VOLTAREN) 1 % GEL, Apply 4 g topically 4 (four) times daily. (Patient not taking: Reported on 11/22/2017), Disp: 100 g, Rfl: 0 .  famciclovir (FAMVIR) 250 MG tablet, Take 1 tablet (250 mg total) by mouth daily., Disp: 30 tablet, Rfl: 8 .  hydrochlorothiazide (MICROZIDE) 12.5 MG capsule, Take 1 capsule (12.5 mg total) by mouth daily. (Patient not taking: Reported on 11/22/2017), Disp: 90 capsule, Rfl: 3 .  ondansetron (ZOFRAN) 8 MG tablet, Take 1 tablet (8 mg total) by mouth 2 (two) times daily as needed for refractory nausea / vomiting. Start on day 2 after bendamustine chemotherapy., Disp: 30 tablet, Rfl: 1 .  oxyCODONE (OXY IR/ROXICODONE) 5 MG immediate release tablet, 1-2 tabs po qid prn pain, Disp: 84 tablet, Rfl: 0 .  prochlorperazine (COMPAZINE) 10 MG tablet, Take 1 tablet (10 mg total) by mouth every 6 (six) hours as needed (Nausea or vomiting)., Disp: 30 tablet, Rfl: 1 .  vitamin C (ASCORBIC ACID) 500  MG tablet, Take 500 mg by mouth daily., Disp: , Rfl:  .  vitamin E 400 UNIT capsule, Take 400 Units by mouth every other day., Disp: , Rfl:  No current facility-administered medications for this visit.   Facility-Administered Medications Ordered in Other Visits:  .  bendamustine (BENDEKA) 125 mg in sodium chloride 0.9 % 50 mL (2.2727 mg/mL) chemo infusion, 72 mg/m2 (Treatment Plan Recorded), Intravenous, Once, Volanda Napoleon, MD, Last Rate: 330 mL/hr at 01/15/18 1421, 125 mg at 01/15/18 1421  Allergies: No Known Allergies  Past Medical History, Surgical history, Social history, and Family History were reviewed and updated.  Review of Systems: Review of Systems    Constitutional: Negative.   HENT:  Negative.   Eyes: Negative.   Respiratory: Negative.   Cardiovascular: Negative.   Gastrointestinal: Negative.   Endocrine: Negative.   Genitourinary: Negative.    Musculoskeletal: Positive for back pain and myalgias.  Skin: Negative.   Neurological: Negative.   Hematological: Negative.   Psychiatric/Behavioral: Negative.     Physical Exam:  weight is 157 lb 12 oz (71.6 kg). His oral temperature is 97.7 F (36.5 C). His blood pressure is 126/65 and his pulse is 62. His respiration is 17.   Wt Readings from Last 3 Encounters:  01/15/18 157 lb 12 oz (71.6 kg)  12/15/17 159 lb (72.1 kg)  12/05/17 159 lb 3.2 oz (72.2 kg)    Physical Exam  Constitutional: He is oriented to person, place, and time.  HENT:  Head: Normocephalic and atraumatic.  Mouth/Throat: Oropharynx is clear and moist.  Eyes: Pupils are equal, round, and reactive to light. EOM are normal.  Neck: Normal range of motion.  Cardiovascular: Normal rate, regular rhythm and normal heart sounds.  Pulmonary/Chest: Effort normal and breath sounds normal.  Abdominal: Soft. Bowel sounds are normal.  Musculoskeletal: Normal range of motion. He exhibits no edema, tenderness or deformity.  Lymphadenopathy:    He has no cervical adenopathy.  Neurological: He is alert and oriented to person, place, and time.  Skin: Skin is warm and dry. No rash noted. No erythema.  Psychiatric: He has a normal mood and affect. His behavior is normal. Judgment and thought content normal.  Vitals reviewed.    Lab Results  Component Value Date   WBC 7.1 01/15/2018   HGB 12.8 (L) 01/15/2018   HCT 39.1 01/15/2018   MCV 92.9 01/15/2018   PLT 194 01/15/2018     Chemistry      Component Value Date/Time   NA 137 01/15/2018 0818   K 4.1 01/15/2018 0818   CL 104 01/15/2018 0818   CO2 29 01/15/2018 0818   BUN 15 01/15/2018 0818   CREATININE 1.10 01/15/2018 0818   CREATININE 1.22 (H) 08/03/2017 1518       Component Value Date/Time   CALCIUM 9.6 01/15/2018 0818   ALKPHOS 95 (H) 01/15/2018 0818   AST 18 01/15/2018 0818   ALT 14 01/15/2018 0818   BILITOT 0.7 01/15/2018 0818      Impression and Plan: Brendan Gonzalez is a 84 year old white male who has a large destructive lesion in the left sacrum.   He has a diffuse large B-cell lymphoma.  This appears to be confined to his bones by his x-ray studies so far.  Again, we will get a PET scan and see how that looks.  I would like to think that the radiation therapy is helped quite a bit.  I have to believe that the chemotherapy that we are  going to use will also be quite helpful.  We will go ahead with our treatment today.  He will then will have bendamustine tomorrow.  I will then have him come back in about 2 weeks.  He will see Brendan Gonzalez.  We will check his blood counts out to make sure that they are not too low.  Again, what we are doing is clearly for quality of life.  I does want to make sure that his bones do not pose a problem with breaking.  He also received [name] See we saw him in September.  His next Xgeva dose will be in December.Volanda Napoleon, MD 10/7/20192:21 PM

## 2018-01-15 NOTE — Patient Instructions (Signed)
[facility] Discharge Instructions for Patients Receiving Chemotherapy  Today you received the following chemotherapy agents Rituxan, Treanda  To help prevent nausea and vomiting after your treatment, we encourage you to take your nausea medication  1) Take Dexamethasone (Decadron) by mouth beginning on Wednesday for 2 days 2) Take Compazine by mouth as needed for nausea every 6 hours  3) Take Zofran by mouth as needed for nausea/vomiting beginning Wednesday twice daily for nausea    If you develop nausea and vomiting that is not controlled by your nausea medication, call the clinic.   BELOW ARE SYMPTOMS THAT SHOULD BE REPORTED IMMEDIATELY:  *FEVER GREATER THAN 100.5 F  *CHILLS WITH OR WITHOUT FEVER  NAUSEA AND VOMITING THAT IS NOT CONTROLLED WITH YOUR NAUSEA MEDICATION  *UNUSUAL SHORTNESS OF BREATH  *UNUSUAL BRUISING OR BLEEDING  TENDERNESS IN MOUTH AND THROAT WITH OR WITHOUT PRESENCE OF ULCERS  *URINARY PROBLEMS  *BOWEL PROBLEMS  UNUSUAL RASH Items with * indicate a potential emergency and should be followed up as soon as possible.  Feel free to call the clinic should you have any questions or concerns. The clinic phone number is [phone].  Please show the [facility] at check-in to the Emergency Department and triage nurse.

## 2018-01-16 ENCOUNTER — Other Ambulatory Visit: Payer: Self-pay

## 2018-01-16 ENCOUNTER — Inpatient Hospital Stay: Payer: Medicare Other

## 2018-01-16 VITALS — BP 134/77 | HR 77 | Temp 97.7°F | Resp 16

## 2018-01-16 DIAGNOSIS — Z923 Personal history of irradiation: Secondary | ICD-10-CM | POA: Diagnosis not present

## 2018-01-16 DIAGNOSIS — C8330 Diffuse large B-cell lymphoma, unspecified site: Secondary | ICD-10-CM

## 2018-01-16 DIAGNOSIS — C8335 Diffuse large B-cell lymphoma, lymph nodes of inguinal region and lower limb: Secondary | ICD-10-CM | POA: Diagnosis not present

## 2018-01-16 DIAGNOSIS — Z5111 Encounter for antineoplastic chemotherapy: Secondary | ICD-10-CM | POA: Diagnosis not present

## 2018-01-16 DIAGNOSIS — M791 Myalgia, unspecified site: Secondary | ICD-10-CM | POA: Diagnosis not present

## 2018-01-16 DIAGNOSIS — C7951 Secondary malignant neoplasm of bone: Secondary | ICD-10-CM | POA: Diagnosis not present

## 2018-01-16 DIAGNOSIS — M549 Dorsalgia, unspecified: Secondary | ICD-10-CM | POA: Diagnosis not present

## 2018-01-16 DIAGNOSIS — C8580 Other specified types of non-Hodgkin lymphoma, unspecified site: Principal | ICD-10-CM

## 2018-01-16 DIAGNOSIS — Z79899 Other long term (current) drug therapy: Secondary | ICD-10-CM | POA: Diagnosis not present

## 2018-01-16 LAB — HEPATITIS PANEL, ACUTE
HCV Ab: <0.1 s/co ratio (ref 0.0–0.9)
Hep A IgM: NEGATIVE
Hep B C IgM: NEGATIVE
Hepatitis B Surface Ag: NEGATIVE

## 2018-01-16 MED ORDER — ONDANSETRON HCL 8 MG PO TABS: 8.0000 mg | ORAL_TABLET | Freq: Two times a day (BID) | ORAL | 1 refills | Status: DC | PRN

## 2018-01-16 MED ORDER — DEXAMETHASONE SODIUM PHOSPHATE 10 MG/ML IJ SOLN
10.0000 mg | Freq: Once | INTRAMUSCULAR | Status: AC
  Administered 2018-01-16: 10 mg via INTRAVENOUS

## 2018-01-16 MED ORDER — SODIUM CHLORIDE 0.9 % IV SOLN
Freq: Once | INTRAVENOUS | Status: AC
  Administered 2018-01-16: 09:00:00 via INTRAVENOUS
  Filled 2018-01-16: qty 250

## 2018-01-16 MED ORDER — DEXAMETHASONE SODIUM PHOSPHATE 10 MG/ML IJ SOLN
INTRAMUSCULAR | Status: AC
  Filled 2018-01-16: qty 1

## 2018-01-16 MED ORDER — SODIUM CHLORIDE 0.9 % IV SOLN
72.0000 mg/m2 | Freq: Once | INTRAVENOUS | Status: AC
  Administered 2018-01-16: 125 mg via INTRAVENOUS
  Filled 2018-01-16: qty 5

## 2018-01-16 NOTE — Patient Instructions (Signed)

## 2018-01-17 ENCOUNTER — Telehealth: Payer: Self-pay | Admitting: Family Medicine

## 2018-01-17 NOTE — Telephone Encounter (Signed)
Copied from [facility] [id number]. Topic: General - Other >> Jan 17, 2018  5:09 PM Cecelia Byars, NT wrote: Reason for CRM: Patient called and said the office called and left a message with her husband ,he is not able to give her the message ans she would like for  some one to call her back as soon as possible

## 2018-01-18 NOTE — Telephone Encounter (Signed)
This phone note was doc in error. I called SW Durwin yesterday about his wife's echo results. He asked that I call back because she was not home at the time. I SW her this morning.

## 2018-01-19 ENCOUNTER — Other Ambulatory Visit: Payer: Self-pay | Admitting: Family Medicine

## 2018-01-19 ENCOUNTER — Telehealth: Payer: Self-pay

## 2018-01-19 NOTE — Telephone Encounter (Signed)
No, he does not need to take this medication anymore.-thx

## 2018-01-19 NOTE — Telephone Encounter (Signed)
Chemotherapy f/u call done, pt. feels well, no complaints. Nausea medications were reviewed, patient verbalized understanding, no further questions for [place]. Advised him to call the office with any questions or concerns.

## 2018-01-19 NOTE — Telephone Encounter (Signed)
Requested medication (s) are due for refill today: yes  Requested medication (s) are on the active medication list: yes  Last refill:  ?  Future visit scheduled: yes  Notes to clinic: pt stated not taking on 11/22/17; does Dr Anitra Lauth want him to resume this medication?    Requested Prescriptions  Pending Prescriptions Disp Refills   atorvastatin (LIPITOR) 10 MG tablet 90 tablet 0    Sig: TAKE ONE TABLET BY MOUTH ONCE DAILY     Cardiovascular:  Antilipid - Statins Passed - 01/19/2018 10:47 AM      Passed - Total Cholesterol in normal range and within 360 days    Cholesterol  Date Value Ref Range Status  04/13/2017 133 <200 mg/dL Final         Passed - LDL in normal range and within 360 days    LDL Cholesterol (Calc)  Date Value Ref Range Status  04/13/2017 49 mg/dL (calc) Final    Comment:    Reference range: <100 . Desirable range <100 mg/dL for primary prevention;   <70 mg/dL for patients with CHD or diabetic patients  with > or = 2 CHD risk factors. [name] LDL-C is now calculated using the Martin-Hopkins  calculation, which is a validated novel method providing  better accuracy than the Friedewald equation in the  estimation of LDL-C.  Cresenciano Genre et al. Annamaria Helling. 5643;329(51): 2061-2068  (http://education.[employer].com/faq/FAQ164)          Passed - HDL in normal range and within 360 days    HDL  Date Value Ref Range Status  04/13/2017 68 >40 mg/dL Final         Passed - Triglycerides in normal range and within 360 days    Triglycerides  Date Value Ref Range Status  04/13/2017 80 <150 mg/dL Final         Passed - Patient is not pregnant      Passed - Valid encounter within last 12 months    Recent Outpatient Visits          1 month ago Bone metastases ([place])   [place], Adrian Blackwater, MD   3 months ago Health maintenance examination   [place], Adrian Blackwater, MD   5 months ago Bone metastases [facility])    [place], Adrian Blackwater, MD   5 months ago Skull lesion   [place], Adrian Blackwater, MD   6 months ago Lytic bone lesions on xray   [place], Adrian Blackwater, MD      Future Appointments            In 2 months [place], Adrian Blackwater, MD [place], [facility]

## 2018-01-19 NOTE — Telephone Encounter (Signed)
Copied from [place] [id number]. Topic: Quick Communication - Rx Refill/Question >> Jan 19, 2018 10:37 AM Scherrie Gerlach wrote: Medication: atorvastatin (LIPITOR) 10 MG tablet 90 day  [id number] - [place], [place] - [address] [phone] (Phone) [phone] (Fax)

## 2018-01-23 ENCOUNTER — Other Ambulatory Visit: Payer: Self-pay

## 2018-01-25 ENCOUNTER — Other Ambulatory Visit: Payer: Self-pay | Admitting: Family Medicine

## 2018-01-25 MED ORDER — ATORVASTATIN CALCIUM 10 MG PO TABS: ORAL_TABLET | ORAL | 0 refills | Status: DC

## 2018-01-25 NOTE — Telephone Encounter (Signed)
Copied from [place] [id number]. Topic: General - Other >> Jan 25, 2018  9:58 AM Janace Aris A wrote: Medication: atorvastatin (LIPITOR) 10 MG tablet   Has the patient contacted their pharmacy? Yes  Preferred Pharmacy (with phone number or street name):  [id number] - [place], [place]  [phone] (Phone) [phone] (Fax)    Agent: Please be advised that RX refills may take up to 3 business days. We ask that you follow-up with your pharmacy.

## 2018-01-29 ENCOUNTER — Encounter: Payer: Self-pay | Admitting: Family Medicine

## 2018-02-12 ENCOUNTER — Inpatient Hospital Stay: Payer: Medicare Other

## 2018-02-12 ENCOUNTER — Inpatient Hospital Stay: Payer: Medicare Other | Attending: Hematology & Oncology | Admitting: Hematology & Oncology

## 2018-02-12 ENCOUNTER — Other Ambulatory Visit: Payer: Self-pay

## 2018-02-12 ENCOUNTER — Encounter: Payer: Self-pay | Admitting: Hematology & Oncology

## 2018-02-12 VITALS — BP 145/78 | HR 70 | Temp 97.5°F | Resp 22

## 2018-02-12 VITALS — BP 117/66 | HR 63 | Temp 97.5°F | Resp 17 | Wt 152.4 lb

## 2018-02-12 DIAGNOSIS — C7951 Secondary malignant neoplasm of bone: Secondary | ICD-10-CM | POA: Diagnosis not present

## 2018-02-12 DIAGNOSIS — Z79899 Other long term (current) drug therapy: Secondary | ICD-10-CM | POA: Insufficient documentation

## 2018-02-12 DIAGNOSIS — Z5112 Encounter for antineoplastic immunotherapy: Secondary | ICD-10-CM | POA: Insufficient documentation

## 2018-02-12 DIAGNOSIS — C8580 Other specified types of non-Hodgkin lymphoma, unspecified site: Principal | ICD-10-CM

## 2018-02-12 DIAGNOSIS — Z923 Personal history of irradiation: Secondary | ICD-10-CM | POA: Insufficient documentation

## 2018-02-12 DIAGNOSIS — Z5111 Encounter for antineoplastic chemotherapy: Secondary | ICD-10-CM | POA: Insufficient documentation

## 2018-02-12 DIAGNOSIS — M549 Dorsalgia, unspecified: Secondary | ICD-10-CM | POA: Insufficient documentation

## 2018-02-12 DIAGNOSIS — Z7951 Long term (current) use of inhaled steroids: Secondary | ICD-10-CM

## 2018-02-12 DIAGNOSIS — C8335 Diffuse large B-cell lymphoma, lymph nodes of inguinal region and lower limb: Secondary | ICD-10-CM | POA: Insufficient documentation

## 2018-02-12 DIAGNOSIS — C801 Malignant (primary) neoplasm, unspecified: Principal | ICD-10-CM

## 2018-02-12 DIAGNOSIS — C8330 Diffuse large B-cell lymphoma, unspecified site: Secondary | ICD-10-CM

## 2018-02-12 LAB — CMP (CANCER CENTER ONLY)
ALBUMIN: 3.2 g/dL — AB (ref 3.5–5.0)
ALT: 20 U/L (ref 10–47)
AST: 30 U/L (ref 11–38)
Alkaline Phosphatase: 66 U/L (ref 26–84)
Anion gap: 11 (ref 5–15)
BUN: 15 mg/dL (ref 7–22)
CALCIUM: 9.2 mg/dL (ref 8.0–10.3)
CO2: 26 mmol/L (ref 18–33)
CREATININE: 1.1 mg/dL (ref 0.60–1.20)
Chloride: 102 mmol/L (ref 98–108)
GLUCOSE: 116 mg/dL (ref 73–118)
Potassium: 3.9 mmol/L (ref 3.3–4.7)
Sodium: 139 mmol/L (ref 128–145)
TOTAL PROTEIN: 6.4 g/dL (ref 6.4–8.1)
Total Bilirubin: 0.7 mg/dL (ref 0.2–1.6)

## 2018-02-12 LAB — CBC WITH DIFFERENTIAL (CANCER CENTER ONLY)
ABS IMMATURE GRANULOCYTES: 0.16 10*3/uL — AB (ref 0.00–0.07)
BASOS PCT: 1 %
Basophils Absolute: 0.1 10*3/uL (ref 0.0–0.1)
Eosinophils Absolute: 0.6 10*3/uL — ABNORMAL HIGH (ref 0.0–0.5)
Eosinophils Relative: 8 %
HCT: 37.8 % — ABNORMAL LOW (ref 39.0–52.0)
HEMOGLOBIN: 11.9 g/dL — AB (ref 13.0–17.0)
IMMATURE GRANULOCYTES: 2 %
LYMPHS PCT: 12 %
Lymphs Abs: 0.9 10*3/uL (ref 0.7–4.0)
MCH: 28.7 pg (ref 26.0–34.0)
MCHC: 31.5 g/dL (ref 30.0–36.0)
MCV: 91.3 fL (ref 80.0–100.0)
MONO ABS: 0.9 10*3/uL (ref 0.1–1.0)
MONOS PCT: 13 %
NEUTROS PCT: 64 %
Neutro Abs: 4.5 10*3/uL (ref 1.7–7.7)
PLATELETS: 255 10*3/uL (ref 150–400)
RBC: 4.14 MIL/uL — AB (ref 4.22–5.81)
RDW: 14.2 % (ref 11.5–15.5)
WBC Count: 7.2 10*3/uL (ref 4.0–10.5)
nRBC: 0 % (ref 0.0–0.2)

## 2018-02-12 LAB — LACTATE DEHYDROGENASE: LDH: 173 U/L (ref 98–192)

## 2018-02-12 LAB — URIC ACID: URIC ACID, SERUM: 6.3 mg/dL (ref 3.7–8.6)

## 2018-02-12 MED ORDER — DEXAMETHASONE SODIUM PHOSPHATE 10 MG/ML IJ SOLN
10.0000 mg | Freq: Once | INTRAMUSCULAR | Status: AC
  Administered 2018-02-12: 10 mg via INTRAVENOUS

## 2018-02-12 MED ORDER — PALONOSETRON HCL INJECTION 0.25 MG/5ML
0.2500 mg | Freq: Once | INTRAVENOUS | Status: AC
  Administered 2018-02-12: 0.25 mg via INTRAVENOUS

## 2018-02-12 MED ORDER — DIPHENHYDRAMINE HCL 25 MG PO CAPS
50.0000 mg | ORAL_CAPSULE | Freq: Once | ORAL | Status: AC
  Administered 2018-02-12: 50 mg via ORAL

## 2018-02-12 MED ORDER — DIPHENHYDRAMINE HCL 25 MG PO CAPS
ORAL_CAPSULE | ORAL | Status: AC
  Filled 2018-02-12: qty 2

## 2018-02-12 MED ORDER — DEXAMETHASONE SODIUM PHOSPHATE 10 MG/ML IJ SOLN
INTRAMUSCULAR | Status: AC
  Filled 2018-02-12: qty 1

## 2018-02-12 MED ORDER — SODIUM CHLORIDE 0.9 % IV SOLN
72.0000 mg/m2 | Freq: Once | INTRAVENOUS | Status: AC
  Administered 2018-02-12: 125 mg via INTRAVENOUS
  Filled 2018-02-12: qty 5

## 2018-02-12 MED ORDER — SODIUM CHLORIDE 0.9 % IV SOLN
375.0000 mg/m2 | Freq: Once | INTRAVENOUS | Status: AC
  Administered 2018-02-12: 700 mg via INTRAVENOUS
  Filled 2018-02-12: qty 50

## 2018-02-12 MED ORDER — METHYLPREDNISOLONE SODIUM SUCC 125 MG IJ SOLR
125.0000 mg | Freq: Once | INTRAMUSCULAR | Status: AC | PRN
  Administered 2018-02-12: 125 mg via INTRAVENOUS

## 2018-02-12 MED ORDER — ACETAMINOPHEN 325 MG PO TABS
ORAL_TABLET | ORAL | Status: AC
  Filled 2018-02-12: qty 2

## 2018-02-12 MED ORDER — PALONOSETRON HCL INJECTION 0.25 MG/5ML
INTRAVENOUS | Status: AC
  Filled 2018-02-12: qty 5

## 2018-02-12 MED ORDER — SODIUM CHLORIDE 0.9 % IV SOLN
Freq: Once | INTRAVENOUS | Status: AC
  Administered 2018-02-12: 11:00:00 via INTRAVENOUS
  Filled 2018-02-12: qty 250

## 2018-02-12 MED ORDER — MEPERIDINE HCL 25 MG/ML IJ SOLN
INTRAMUSCULAR | Status: AC
  Filled 2018-02-12: qty 1

## 2018-02-12 MED ORDER — MEPERIDINE HCL 25 MG/ML IJ SOLN
12.5000 mg | Freq: Once | INTRAMUSCULAR | Status: AC
  Administered 2018-02-12: 12.5 mg via INTRAVENOUS

## 2018-02-12 MED ORDER — ACETAMINOPHEN 325 MG PO TABS
650.0000 mg | ORAL_TABLET | Freq: Once | ORAL | Status: AC
  Administered 2018-02-12: 650 mg via ORAL

## 2018-02-12 NOTE — Progress Notes (Signed)
Patient had no adverse effects with 1st cycle of Rituxan. Dr. Marin Olp would like to try RIR today, but will proceed slightly more slowly than a usual rapid infusion.  The patient will get the infusion over 3-4 hours if he tolerates it without incident.  Addendum:  Patient reacted to Rituxan today.   All future doses of Rituxan will be given as standard infusion.

## 2018-02-12 NOTE — Progress Notes (Signed)
Patient has been resting comfortably for about 30 minutes now with chills and tremors resolved and back pain nearly resolved. He believes he is somewhat uncomfortable with his position in the chair but declined any assistance with repositioning.

## 2018-02-12 NOTE — Progress Notes (Signed)
Hematology and Oncology Follow Up Visit  Brendan Gonzalez [id number] 1919-06-22 84 y.o. 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  He actually tolerated his first treatment quite well.  He really had no issues with toxicity.  He had no nausea or vomiting.  He does feel a little bit more tired.  There is no pain that is having.  He has had no cough or shortness of breath.  There is been no mouth sores.  He has had no diarrhea.  There has been no bleeding.  He has had no leg swelling.  I told he and his son that after this second cycle of treatment, we will then plan for a PET scan to see how everything looks.  That his performance status is ECOG 2 at best.   Medications:  Current Outpatient Medications:  .  allopurinol (ZYLOPRIM) 100 MG tablet, Take 1 tablet (100 mg total) by mouth daily., Disp: 30 tablet, Rfl: 0 .  amLODipine (NORVASC) 10 MG tablet, Take 1 tablet (10 mg total) by mouth daily., Disp: 90 tablet, Rfl: 1 .  atenolol (TENORMIN) 100 MG tablet, Take 1 tablet (100 mg total) by mouth daily., Disp: 90 tablet, Rfl: 1 .  atorvastatin (LIPITOR) 10 MG tablet, TAKE ONE TABLET BY MOUTH ONCE DAILY, Disp: 90 tablet, Rfl: 0 .  cholecalciferol (VITAMIN D) 1000 UNITS tablet, Take 1,000 Units by mouth daily., Disp: , Rfl:  .  clopidogrel (PLAVIX) 75 MG tablet, TAKE 1 TABLET BY MOUTH DAILY (Patient not taking: Reported on 12/05/2017), Disp: 90 tablet, Rfl: 1 .  dexamethasone (DECADRON) 4 MG tablet, Take 2 tablets (8 mg total) by mouth daily. Start the day after bendamustine chemotherapy for 2 days. Take with food., Disp: 30 tablet, Rfl: 1 .  diclofenac sodium (VOLTAREN) 1 % GEL, Apply 4 g topically 4 (four) times daily. (Patient not taking: Reported on 11/22/2017),  Disp: 100 g, Rfl: 0 .  famciclovir (FAMVIR) 250 MG tablet, Take 1 tablet (250 mg total) by mouth daily., Disp: 30 tablet, Rfl: 8 .  hydrochlorothiazide (MICROZIDE) 12.5 MG capsule, Take 1 capsule (12.5 mg total) by mouth daily. (Patient not taking: Reported on 11/22/2017), Disp: 90 capsule, Rfl: 3 .  ondansetron (ZOFRAN) 8 MG tablet, Take 1 tablet (8 mg total) by mouth 2 (two) times daily as needed for refractory nausea / vomiting. Start on day 2 after bendamustine chemo, Disp: 30 tablet, Rfl: 1 .  oxyCODONE (OXY IR/ROXICODONE) 5 MG immediate release tablet, 1-2 tabs po qid prn pain, Disp: 84 tablet, Rfl: 0 .  prochlorperazine (COMPAZINE) 10 MG tablet, Take 1 tablet (10 mg total) by mouth every 6 (six) hours as needed (Nausea or vomiting)., Disp: 30 tablet, Rfl: 1 .  vitamin C (ASCORBIC ACID) 500 MG tablet, Take 500 mg by mouth daily., Disp: , Rfl:  .  vitamin E 400 UNIT capsule, Take 400 Units by mouth every other day., Disp: , Rfl:   Allergies: No Known Allergies  Past Medical History, Surgical history, Social history, and Family History were reviewed and updated.  Review of Systems: Review of Systems  Constitutional: Negative.   HENT:  Negative.   Eyes: Negative.   Respiratory: Negative.   Cardiovascular: Negative.   Gastrointestinal:  Negative.   Endocrine: Negative.   Genitourinary: Negative.    Musculoskeletal: Positive for back pain and myalgias.  Skin: Negative.   Neurological: Negative.   Hematological: Negative.   Psychiatric/Behavioral: Negative.     Physical Exam:  weight is 152 lb 6.4 oz (69.1 kg). His oral temperature is 97.5 F (36.4 C) (abnormal). His blood pressure is 117/66 and his pulse is 63. His respiration is 17 and oxygen saturation is 100%.   Wt Readings from Last 3 Encounters:  02/12/18 152 lb 6.4 oz (69.1 kg)  01/15/18 157 lb 12 oz (71.6 kg)  12/15/17 159 lb (72.1 kg)    Physical Exam  Constitutional: He is oriented to person, place, and time.  HENT:    Head: Normocephalic and atraumatic.  Mouth/Throat: Oropharynx is clear and moist.  Eyes: Pupils are equal, round, and reactive to light. EOM are normal.  Neck: Normal range of motion.  Cardiovascular: Normal rate, regular rhythm and normal heart sounds.  Pulmonary/Chest: Effort normal and breath sounds normal.  Abdominal: Soft. Bowel sounds are normal.  Musculoskeletal: Normal range of motion. He exhibits no edema, tenderness or deformity.  Lymphadenopathy:    He has no cervical adenopathy.  Neurological: He is alert and oriented to person, place, and time.  Skin: Skin is warm and dry. No rash noted. No erythema.  Psychiatric: He has a normal mood and affect. His behavior is normal. Judgment and thought content normal.  Vitals reviewed.    Lab Results  Component Value Date   WBC 7.2 02/12/2018   HGB 11.9 (L) 02/12/2018   HCT 37.8 (L) 02/12/2018   MCV 91.3 02/12/2018   PLT 255 02/12/2018     Chemistry      Component Value Date/Time   NA 137 01/15/2018 0818   K 4.1 01/15/2018 0818   CL 104 01/15/2018 0818   CO2 29 01/15/2018 0818   BUN 15 01/15/2018 0818   CREATININE 1.10 01/15/2018 0818   CREATININE 1.22 (H) 08/03/2017 1518      Component Value Date/Time   CALCIUM 9.6 01/15/2018 0818   ALKPHOS 95 (H) 01/15/2018 0818   AST 18 01/15/2018 0818   ALT 14 01/15/2018 0818   BILITOT 0.7 01/15/2018 0818      Impression and Plan: Brendan Gonzalez is a 84 year old white male who has a large destructive lesion in the left sacrum.   He has a diffuse large B-cell lymphoma.  This appears to be confined to his bones by his x-ray studies so far.  Again, we will get a PET scan and see how that looks.  I would like to think that the radiation therapy has helped quite a bit.  I have to believe that the chemotherapy that we are going to use will also be quite helpful.  We will plan for cycle #2 today.  I will then do a PET scan in 3 weeks.  Hopefully, we will find that he is  responding.  If his response has not been that great, there is the new monoclonal antibody that we add to the Rituxan/bendamustine refer to as Polivi.  This might be something to think about.  I will plan to get him back after Thanksgiving.  Brendan Napoleon, MD 11/4/20199:33 AM

## 2018-02-12 NOTE — Progress Notes (Signed)
Patient complaining of "chillls and back pain" at 1247.  Rituxan stopped. HR-68, BP-92/63, O2 sat 88% on RA, O2 @ 2L [place] applied.  Dr Marin Olp to patient's chairside.  Demerol 12.5 mg IV and Solumedrol 125 mg IV given per order of Dr. Marin Olp.  1305-Pt states that back pain is slightly less, chills have subsided.  Continue to hold Rituxan.  NS infusing.

## 2018-02-12 NOTE — Patient Instructions (Signed)
[facility] Discharge Instructions for Patients Receiving Chemotherapy  Today you received the following chemotherapy agents Rituxan and Bendeka .  To help prevent nausea and vomiting after your treatment, we encourage you to take your nausea medication as directed BUT NO ZOFRAN FOR 3 DAYS AFTER CHEMO.   If you develop nausea and vomiting that is not controlled by your nausea medication, call the clinic.   BELOW ARE SYMPTOMS THAT SHOULD BE REPORTED IMMEDIATELY:  *FEVER GREATER THAN 100.5 F  *CHILLS WITH OR WITHOUT FEVER  NAUSEA AND VOMITING THAT IS NOT CONTROLLED WITH YOUR NAUSEA MEDICATION  *UNUSUAL SHORTNESS OF BREATH  *UNUSUAL BRUISING OR BLEEDING  TENDERNESS IN MOUTH AND THROAT WITH OR WITHOUT PRESENCE OF ULCERS  *URINARY PROBLEMS  *BOWEL PROBLEMS  UNUSUAL RASH Items with * indicate a potential emergency and should be followed up as soon as possible.  Feel free to call the clinic you have any questions or concerns. The clinic phone number is [phone].  Please show the CHEMO ALERT CARD at check-in to the Emergency Department and triage nurse.    

## 2018-02-13 ENCOUNTER — Inpatient Hospital Stay: Payer: Medicare Other

## 2018-02-13 VITALS — BP 109/68 | HR 80 | Temp 97.8°F | Resp 18

## 2018-02-13 DIAGNOSIS — C7951 Secondary malignant neoplasm of bone: Secondary | ICD-10-CM | POA: Diagnosis not present

## 2018-02-13 DIAGNOSIS — Z79899 Other long term (current) drug therapy: Secondary | ICD-10-CM | POA: Diagnosis not present

## 2018-02-13 DIAGNOSIS — Z5111 Encounter for antineoplastic chemotherapy: Secondary | ICD-10-CM | POA: Diagnosis not present

## 2018-02-13 DIAGNOSIS — C8580 Other specified types of non-Hodgkin lymphoma, unspecified site: Principal | ICD-10-CM

## 2018-02-13 DIAGNOSIS — Z923 Personal history of irradiation: Secondary | ICD-10-CM | POA: Diagnosis not present

## 2018-02-13 DIAGNOSIS — Z5112 Encounter for antineoplastic immunotherapy: Secondary | ICD-10-CM | POA: Diagnosis not present

## 2018-02-13 DIAGNOSIS — M549 Dorsalgia, unspecified: Secondary | ICD-10-CM | POA: Diagnosis not present

## 2018-02-13 DIAGNOSIS — C8335 Diffuse large B-cell lymphoma, lymph nodes of inguinal region and lower limb: Secondary | ICD-10-CM | POA: Diagnosis not present

## 2018-02-13 DIAGNOSIS — C8330 Diffuse large B-cell lymphoma, unspecified site: Secondary | ICD-10-CM

## 2018-02-13 MED ORDER — DEXAMETHASONE SODIUM PHOSPHATE 10 MG/ML IJ SOLN
INTRAMUSCULAR | Status: AC
  Filled 2018-02-13: qty 1

## 2018-02-13 MED ORDER — DEXAMETHASONE SODIUM PHOSPHATE 10 MG/ML IJ SOLN
10.0000 mg | Freq: Once | INTRAMUSCULAR | Status: AC
  Administered 2018-02-13: 10 mg via INTRAVENOUS

## 2018-02-13 MED ORDER — SODIUM CHLORIDE 0.9 % IV SOLN
72.0000 mg/m2 | Freq: Once | INTRAVENOUS | Status: AC
  Administered 2018-02-13: 125 mg via INTRAVENOUS
  Filled 2018-02-13: qty 5

## 2018-02-13 MED ORDER — SODIUM CHLORIDE 0.9 % IV SOLN
Freq: Once | INTRAVENOUS | Status: AC
  Administered 2018-02-13: 09:00:00 via INTRAVENOUS
  Filled 2018-02-13: qty 250

## 2018-02-13 NOTE — Patient Instructions (Signed)
[facility] Discharge Instructions for Patients Receiving Chemotherapy  Today you received the following chemotherapy agents bendeka.  To help prevent nausea and vomiting after your treatment, we encourage you to take your nausea medication as prescribed by your MD.   If you develop nausea and vomiting that is not controlled by your nausea medication, call the clinic.   BELOW ARE SYMPTOMS THAT SHOULD BE REPORTED IMMEDIATELY:  *FEVER GREATER THAN 100.5 F  *CHILLS WITH OR WITHOUT FEVER  NAUSEA AND VOMITING THAT IS NOT CONTROLLED WITH YOUR NAUSEA MEDICATION  *UNUSUAL SHORTNESS OF BREATH  *UNUSUAL BRUISING OR BLEEDING  TENDERNESS IN MOUTH AND THROAT WITH OR WITHOUT PRESENCE OF ULCERS  *URINARY PROBLEMS  *BOWEL PROBLEMS  UNUSUAL RASH Items with * indicate a potential emergency and should be followed up as soon as possible.  Feel free to call the clinic should you have any questions or concerns. The clinic phone number is [phone].  Please show the [place] at check-in to the Emergency Department and triage nurse.

## 2018-02-21 ENCOUNTER — Telehealth: Payer: Self-pay | Admitting: Hematology & Oncology

## 2018-02-21 NOTE — Telephone Encounter (Signed)
Spoke with patient regarding appt for PET scan.  He was given date/time/location and special instructions

## 2018-02-22 ENCOUNTER — Telehealth: Payer: Self-pay | Admitting: *Deleted

## 2018-02-22 ENCOUNTER — Other Ambulatory Visit: Payer: Self-pay | Admitting: Family

## 2018-02-22 NOTE — Telephone Encounter (Signed)
Spoke with wife regarding patient needing to have a wisdom tooth extracted. Requested the wife call the office and inform us of the appointment date. She verbalized understanding.

## 2018-02-23 ENCOUNTER — Emergency Department (HOSPITAL_BASED_OUTPATIENT_CLINIC_OR_DEPARTMENT_OTHER)
Admission: EM | Admit: 2018-02-23 | Discharge: 2018-02-24 | Disposition: A | Discharge status: Home / Self Care | Payer: Medicare Other | Attending: Emergency Medicine | Admitting: Emergency Medicine

## 2018-02-23 ENCOUNTER — Other Ambulatory Visit: Payer: Self-pay

## 2018-02-23 ENCOUNTER — Encounter (HOSPITAL_BASED_OUTPATIENT_CLINIC_OR_DEPARTMENT_OTHER): Payer: Self-pay | Admitting: *Deleted

## 2018-02-23 ENCOUNTER — Emergency Department (HOSPITAL_BASED_OUTPATIENT_CLINIC_OR_DEPARTMENT_OTHER): Payer: Medicare Other

## 2018-02-23 ENCOUNTER — Telehealth: Payer: Self-pay | Admitting: *Deleted

## 2018-02-23 DIAGNOSIS — N183 Chronic kidney disease, stage 3 (moderate): Secondary | ICD-10-CM | POA: Diagnosis not present

## 2018-02-23 DIAGNOSIS — Z79899 Other long term (current) drug therapy: Secondary | ICD-10-CM | POA: Insufficient documentation

## 2018-02-23 DIAGNOSIS — C8590 Non-Hodgkin lymphoma, unspecified, unspecified site: Secondary | ICD-10-CM | POA: Diagnosis not present

## 2018-02-23 DIAGNOSIS — I129 Hypertensive chronic kidney disease with stage 1 through stage 4 chronic kidney disease, or unspecified chronic kidney disease: Secondary | ICD-10-CM | POA: Diagnosis not present

## 2018-02-23 DIAGNOSIS — R930 Abnormal findings on diagnostic imaging of skull and head, not elsewhere classified: Secondary | ICD-10-CM | POA: Diagnosis not present

## 2018-02-23 DIAGNOSIS — R6510 Systemic inflammatory response syndrome (SIRS) of non-infectious origin without acute organ dysfunction: Secondary | ICD-10-CM

## 2018-02-23 DIAGNOSIS — R918 Other nonspecific abnormal finding of lung field: Secondary | ICD-10-CM | POA: Diagnosis not present

## 2018-02-23 DIAGNOSIS — K047 Periapical abscess without sinus: Secondary | ICD-10-CM | POA: Diagnosis not present

## 2018-02-23 DIAGNOSIS — R531 Weakness: Secondary | ICD-10-CM

## 2018-02-23 DIAGNOSIS — R220 Localized swelling, mass and lump, head: Secondary | ICD-10-CM | POA: Diagnosis not present

## 2018-02-23 DIAGNOSIS — R42 Dizziness and giddiness: Secondary | ICD-10-CM | POA: Diagnosis present

## 2018-02-23 DIAGNOSIS — Z87891 Personal history of nicotine dependence: Secondary | ICD-10-CM | POA: Diagnosis not present

## 2018-02-23 DIAGNOSIS — N281 Cyst of kidney, acquired: Secondary | ICD-10-CM | POA: Diagnosis not present

## 2018-02-23 LAB — CBC WITH DIFFERENTIAL/PLATELET
Abs Immature Granulocytes: 0.22 10*3/uL — ABNORMAL HIGH (ref 0.00–0.07)
BASOS ABS: 0 10*3/uL (ref 0.0–0.1)
Basophils Relative: 0 %
EOS ABS: 0 10*3/uL (ref 0.0–0.5)
EOS PCT: 0 %
HEMATOCRIT: 40.8 % (ref 39.0–52.0)
Hemoglobin: 13.2 g/dL (ref 13.0–17.0)
Immature Granulocytes: 1 %
LYMPHS ABS: 0.2 10*3/uL — AB (ref 0.7–4.0)
Lymphocytes Relative: 1 %
MCH: 29.3 pg (ref 26.0–34.0)
MCHC: 32.4 g/dL (ref 30.0–36.0)
MCV: 90.5 fL (ref 80.0–100.0)
Monocytes Absolute: 2.1 10*3/uL — ABNORMAL HIGH (ref 0.1–1.0)
Monocytes Relative: 10 %
NRBC: 0 % (ref 0.0–0.2)
Neutro Abs: 17.7 10*3/uL — ABNORMAL HIGH (ref 1.7–7.7)
Neutrophils Relative %: 88 %
PLATELETS: 142 10*3/uL — AB (ref 150–400)
RBC: 4.51 MIL/uL (ref 4.22–5.81)
RDW: 15.1 % (ref 11.5–15.5)
WBC: 20.3 10*3/uL — AB (ref 4.0–10.5)

## 2018-02-23 LAB — URINALYSIS, ROUTINE W REFLEX MICROSCOPIC
Bilirubin Urine: NEGATIVE
Glucose, UA: NEGATIVE mg/dL
HGB URINE DIPSTICK: NEGATIVE
Ketones, ur: NEGATIVE mg/dL
Nitrite: NEGATIVE
PROTEIN: NEGATIVE mg/dL
SPECIFIC GRAVITY, URINE: 1.01 (ref 1.005–1.030)
pH: 7.5 (ref 5.0–8.0)

## 2018-02-23 LAB — COMPREHENSIVE METABOLIC PANEL
ALT: 68 U/L — ABNORMAL HIGH (ref 0–44)
ANION GAP: 13 (ref 5–15)
AST: 48 U/L — ABNORMAL HIGH (ref 15–41)
Albumin: 3.2 g/dL — ABNORMAL LOW (ref 3.5–5.0)
Alkaline Phosphatase: 63 U/L (ref 38–126)
BILIRUBIN TOTAL: 1.4 mg/dL — AB (ref 0.3–1.2)
BUN: 27 mg/dL — AB (ref 8–23)
CO2: 25 mmol/L (ref 22–32)
Calcium: 8.7 mg/dL — ABNORMAL LOW (ref 8.9–10.3)
Chloride: 96 mmol/L — ABNORMAL LOW (ref 98–111)
Creatinine, Ser: 1.27 mg/dL — ABNORMAL HIGH (ref 0.61–1.24)
GFR calc Af Amer: 53 mL/min — ABNORMAL LOW (ref >60)
GFR, EST NON AFRICAN AMERICAN: 46 mL/min — AB (ref >60)
Glucose, Bld: 109 mg/dL — ABNORMAL HIGH (ref 70–99)
POTASSIUM: 4.1 mmol/L (ref 3.5–5.1)
Sodium: 134 mmol/L — ABNORMAL LOW (ref 135–145)
TOTAL PROTEIN: 6.5 g/dL (ref 6.5–8.1)

## 2018-02-23 LAB — URINALYSIS, MICROSCOPIC (REFLEX): RBC / HPF: NONE SEEN RBC/hpf (ref 0–5)

## 2018-02-23 LAB — LIPASE, BLOOD: LIPASE: 30 U/L (ref 11–51)

## 2018-02-23 LAB — I-STAT CG4 LACTIC ACID, ED
LACTIC ACID, VENOUS: 2.31 mmol/L — AB (ref 0.5–1.9)
Lactic Acid, Venous: 0.85 mmol/L (ref 0.5–1.9)

## 2018-02-23 LAB — TROPONIN I

## 2018-02-23 MED ORDER — SODIUM CHLORIDE 0.9 % IV BOLUS
1000.0000 mL | Freq: Once | INTRAVENOUS | Status: AC
  Administered 2018-02-23: 1000 mL via INTRAVENOUS

## 2018-02-23 MED ORDER — IOPAMIDOL (ISOVUE-300) INJECTION 61%
100.0000 mL | Freq: Once | INTRAVENOUS | Status: AC | PRN
  Administered 2018-02-23: 80 mL via INTRAVENOUS

## 2018-02-23 MED ORDER — SODIUM CHLORIDE 0.9 % IV SOLN
1.0000 g | Freq: Once | INTRAVENOUS | Status: AC
  Administered 2018-02-23: 1 g via INTRAVENOUS
  Filled 2018-02-23: qty 10

## 2018-02-23 MED ORDER — CLINDAMYCIN HCL 300 MG PO CAPS: 300.0000 mg | ORAL_CAPSULE | Freq: Four times a day (QID) | ORAL | 0 refills | Status: DC

## 2018-02-23 MED ORDER — CLINDAMYCIN PHOSPHATE 600 MG/50ML IV SOLN
600.0000 mg | Freq: Once | INTRAVENOUS | Status: AC
  Administered 2018-02-23: 600 mg via INTRAVENOUS
  Filled 2018-02-23: qty 50

## 2018-02-23 MED ORDER — ACETAMINOPHEN 325 MG PO TABS
650.0000 mg | ORAL_TABLET | Freq: Once | ORAL | Status: AC
  Administered 2018-02-23: 650 mg via ORAL
  Filled 2018-02-23: qty 2

## 2018-02-23 MED ORDER — ONDANSETRON HCL 4 MG/2ML IJ SOLN
4.0000 mg | Freq: Once | INTRAMUSCULAR | Status: DC
  Filled 2018-02-23: qty 2

## 2018-02-23 NOTE — ED Triage Notes (Signed)
Pt has been doing chemotherapy for a couple of months, pt has not been eating or drinking. He has been getting weaker and complaining of dizziness. Pt doesn't have the will to do anything.

## 2018-02-23 NOTE — ED Notes (Signed)
Patient was able to ambulate with assistance. Family stated that he normally uses a walker. Patient stated he felt "woozy." Family stated that his gait was not the same as normal.

## 2018-02-23 NOTE — ED Notes (Signed)
Family states patient not wanting to eat, wants to sleep and just c/o feeling dizzy last 3 days. Skin warm and dry. No resp distress. Alert.

## 2018-02-23 NOTE — Telephone Encounter (Signed)
Returned wife's phone call regarding patient. She stated,"he is dizzy, not eating, can't get out of the bed". Per Judson Roch, NP, have the patient go to the ER and be evaluated. Also, wife had called yesterday telling this office that he has a wisdom tooth that needs to be extracted. She verbalized understanding.

## 2018-02-23 NOTE — ED Provider Notes (Addendum)
[facility] EMERGENCY DEPARTMENT Provider Note   CSN: [id number] Arrival date & time: 02/23/18  1531     History   Chief Complaint Chief Complaint  Patient presents with  . Dizziness  . Weakness    HPI Brendan Gonzalez is a 84 y.o. male.  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  Since October he is undergone some chemotherapy here with Dr. Marin Olp.  Last chemo was last week.  He has been declining over the month with increased weakness, decreased appetite decreased drinking.  They have been trying to give him Ensure shakes without much improvement.  Over the past week he is more declined and states most of the time in bed and has been difficulty with any transfers.  His wife states he mostly just keeps his eyes closed.  He states he is feeling dizzy but it is difficult to sort out what dizzy is.  He states that started room spinning but he also does not feel like his cannot pass out.  He denies any fevers chills chest pain shortness of breath nausea vomiting diarrhea or urinary symptoms.  He has generalized weakness.  He usually uses a walker.  He is living at home with his wife.  The history is provided by the patient, the spouse and a relative.  Dizziness  Quality:  Unable to specify Onset quality:  Gradual Timing:  Intermittent Progression:  Worsening Chronicity:  New Context: physical activity and standing up   Relieved by:  Lying down Worsened by:  Sitting upright and standing up Associated symptoms: weakness   Associated symptoms: no chest pain, no diarrhea, no headaches, no nausea, no shortness of breath, no syncope, no vision changes and no vomiting   Weakness  Primary symptoms include dizziness.  Primary symptoms include no focal weakness, no visual change. This is a new problem. The current episode started more than 1 week ago. The problem has been gradually worsening. There was no focality noted. There has been no fever. Pertinent negatives  include no shortness of breath, no chest pain, no vomiting and no headaches.    Past Medical History:  Diagnosis Date  . C2 cervical fracture ([place]) 07/2017   Type II morphology with posterior displacement--C collar (potentially indefinitely).  . Chronic lower back pain   . Chronic renal insufficiency, stage III (moderate) (HCC)    GFR 50s-60s  . Colon cancer ([place]) 1993  . CVA (cerebral vascular accident) ([place])    Left MCA territory.  Carotid dopplers ok, Brain MRA ok, ECHO with grd I DD.  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  RT 12/2017 helped with his pain; pt is to get chemo 01/2018.  . Goals of care, counseling/discussion 12/15/2017  . Hypertension   . Lumbar spinal stenosis    Surgery 2012 helped but in 2015 pain has returned  . Lytic bone lesions on xray 08/2017   Skull, mildly abnormal bone scan. SPEP/UPEP w/out monoclonal spike.  Dr. Marin Olp eval 08/2017: plan CT C/A/P but not done as of 11/22/17.  ED visit s/p fall 11/17/17-->multiple lytic lesions now, L sacrum destroyed by tumor, lytic lesion in pelvis. Dr. Marin Olp has arranged for bx, also plan for RT and XGeva.  . Metastasis to bone of unknown primary ([facility]) 11/27/2017   Large B cell lymphoma (bone bx).  RT 12/2017, set to get chemo 01/2018.  . Osteoarthritis   .  Venous insufficiency of both lower extremities    made worse by amlodipine    Patient Active Problem List   Diagnosis Date Noted  . 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 Goals of care, counseling/discussion 12/15/2017  . Metastasis to bone of unknown primary ([place]) 11/27/2017  . Cerebral embolism with cerebral infarction 03/15/2016  . Cryptogenic stroke ([place]) 03/15/2016  . Stroke-like symptoms 03/14/2016  . Slurred speech   . Essential hypertension   . Stage 3 chronic kidney disease ([place])   . Acute bronchitis 01/27/2014  . URI (upper respiratory infection) 01/27/2014     Past Surgical History:  Procedure Laterality Date  . CHOLECYSTECTOMY  1984  . INGUINAL HERNIA REPAIR  1974   [facility]  . LUMBAR SPINE SURGERY  2012   for spinal stenosis (Dr. Jonelle Sports in [place])  . LUNG LOBECTOMY  1993   LLL per pt--worry of possible cancer but turned out to be benign.  [name] PARTIAL COLECTOMY  1993   for colon cancer        Home Medications    Prior to Admission medications   Medication Sig Start Date End Date Taking? Authorizing Provider  allopurinol (ZYLOPRIM) 100 MG tablet Take 1 tablet (100 mg total) by mouth daily. 01/12/18   Volanda Napoleon, MD  amLODipine (NORVASC) 10 MG tablet Take 1 tablet (10 mg total) by mouth daily. 01/12/18   McGowen, Adrian Blackwater, MD  atenolol (TENORMIN) 100 MG tablet Take 1 tablet (100 mg total) by mouth daily. 01/12/18   McGowen, Adrian Blackwater, MD  atorvastatin (LIPITOR) 10 MG tablet TAKE ONE TABLET BY MOUTH ONCE DAILY 01/25/18   McGowen, Adrian Blackwater, MD  cholecalciferol (VITAMIN D) 1000 UNITS tablet Take 1,000 Units by mouth daily.    [provider]  clopidogrel (PLAVIX) 75 MG tablet TAKE 1 TABLET BY MOUTH DAILY Patient not taking: Reported on 12/05/2017 05/19/17   McGowen, Adrian Blackwater, MD  dexamethasone (DECADRON) 4 MG tablet Take 2 tablets (8 mg total) by mouth daily. Start the day after bendamustine chemotherapy for 2 days. Take with food. 01/15/18   Volanda Napoleon, MD  diclofenac sodium (VOLTAREN) 1 % GEL Apply 4 g topically 4 (four) times daily. Patient not taking: Reported on 11/22/2017 11/17/17   Albesa Seen, PA-C  famciclovir (FAMVIR) 250 MG tablet Take 1 tablet (250 mg total) by mouth daily. 01/12/18   Volanda Napoleon, MD  hydrochlorothiazide (MICROZIDE) 12.5 MG capsule Take 1 capsule (12.5 mg total) by mouth daily. Patient not taking: Reported on 11/22/2017 11/09/16   Tammi Sou, MD  ondansetron (ZOFRAN) 8 MG tablet Take 1 tablet (8 mg total) by mouth 2 (two) times daily as needed for refractory nausea / vomiting. Start on day 2  after bendamustine chemo 01/16/18   Volanda Napoleon, MD  oxyCODONE (OXY IR/ROXICODONE) 5 MG immediate release tablet 1-2 tabs po qid prn pain 11/22/17   McGowen, Adrian Blackwater, MD  prochlorperazine (COMPAZINE) 10 MG tablet Take 1 tablet (10 mg total) by mouth every 6 (six) hours as needed (Nausea or vomiting). 01/15/18   Volanda Napoleon, MD  vitamin C (ASCORBIC ACID) 500 MG tablet Take 500 mg by mouth daily.    [provider]  vitamin E 400 UNIT capsule Take 400 Units by mouth every other day.    [provider]    Family History Family History  Problem Relation Age of Onset  . Parkinson's disease Mother   . Heart attack Mother   .  Cancer Brother   . Cancer Daughter     Social History Social History   Tobacco Use  . Smoking status: Former Research scientist (life sciences)  . Smokeless tobacco: Never Used  Substance Use Topics  . Alcohol use: Yes    Comment: daily, gin every night  . Drug use: No     Allergies   Patient has no known allergies.   Review of Systems Review of Systems  Constitutional: Positive for activity change, appetite change and fatigue. Negative for fever.  HENT: Negative for sore throat.   Respiratory: Negative for shortness of breath.   Cardiovascular: Negative for chest pain and syncope.  Gastrointestinal: Negative for abdominal pain, diarrhea, nausea and vomiting.  Genitourinary: Negative for dysuria.  Musculoskeletal: Positive for gait problem. Negative for joint swelling.  Skin: Negative for rash.  Neurological: Positive for dizziness and weakness. Negative for focal weakness, seizures and headaches.     Physical Exam Updated Vital Signs BP (!) 121/94 (BP Location: Right Arm)   Pulse 98   Temp 99.2 F (37.3 C) (Oral)   Resp 16   Ht _0  (1.702 m)   Wt 69.1 kg   SpO2 95%   BMI 23.87 kg/m   Physical Exam  Constitutional: He appears well-developed and well-nourished.  Non-toxic appearance. No distress.  HENT:  Head: Normocephalic and atraumatic.   Mouth/Throat:    Eyes: Conjunctivae are normal.  Neck: Neck supple.    Cardiovascular: Normal rate, regular rhythm and normal heart sounds.  No murmur heard. Pulmonary/Chest: Effort normal and breath sounds normal. No respiratory distress.  Abdominal: Soft. There is no tenderness.  Musculoskeletal: He exhibits no edema or deformity.  Neurological: He is alert. No sensory deficit. GCS eye subscore is 4. GCS verbal subscore is 5. GCS motor subscore is 6.  His strength is symmetric but generally weak all over.  He needed assistance with transfers from chair to bed.  Skin: Skin is warm and dry.  Psychiatric: He has a normal mood and affect.  Nursing note and vitals reviewed.    ED Treatments / Results  Labs (all labs ordered are listed, but only abnormal results are displayed) Labs Reviewed  COMPREHENSIVE METABOLIC PANEL - Abnormal; Notable for the following components:      Result Value   Sodium 134 (*)    Chloride 96 (*)    Glucose, Bld 109 (*)    BUN 27 (*)    Creatinine, Ser 1.27 (*)    Calcium 8.7 (*)    Albumin 3.2 (*)    AST 48 (*)    ALT 68 (*)    Total Bilirubin 1.4 (*)    GFR calc non Af Amer 46 (*)    GFR calc Af Amer 53 (*)    All other components within normal limits  CBC WITH DIFFERENTIAL/PLATELET - Abnormal; Notable for the following components:   WBC 20.3 (*)    Platelets 142 (*)    Neutro Abs 17.7 (*)    Lymphs Abs 0.2 (*)    Monocytes Absolute 2.1 (*)    Abs Immature Granulocytes 0.22 (*)    All other components within normal limits  URINALYSIS, ROUTINE W REFLEX MICROSCOPIC - Abnormal; Notable for the following components:   Leukocytes, UA SMALL (*)    All other components within normal limits  URINALYSIS, MICROSCOPIC (REFLEX) - Abnormal; Notable for the following components:   Bacteria, UA RARE (*)    All other components within normal limits  I-STAT CG4 LACTIC ACID, ED -  Abnormal; Notable for the following components:   Lactic Acid, Venous 2.31  (*)    All other components within normal limits  CULTURE, BLOOD (ROUTINE X 2)  CULTURE, BLOOD (ROUTINE X 2)  LIPASE, BLOOD  TROPONIN I  I-STAT CG4 LACTIC ACID, ED    EKG None  Radiology Ct Head Wo Contrast  Result Date: 02/23/2018 CLINICAL DATA:  Facial swelling. Undergoing treatment for diffuse large B-cell lymphoma. EXAM: CT HEAD WITHOUT CONTRAST CT MAXILLOFACIAL WITHOUT CONTRAST TECHNIQUE: Multidetector CT imaging of the head and maxillofacial structures were performed using the standard protocol without intravenous contrast. Multiplanar CT image reconstructions of the maxillofacial structures were also generated. COMPARISON:  Head CT 11/17/2017 FINDINGS: CT HEAD FINDINGS Brain: There is no evidence for acute hemorrhage, hydrocephalus, mass lesion, or abnormal extra-axial fluid collection. No definite CT evidence for acute infarction. Diffuse loss of parenchymal volume is consistent with atrophy. Patchy low attenuation in the deep hemispheric and periventricular white matter is nonspecific, but likely reflects chronic microvascular ischemic demyelination. Vascular: No hyperdense vessel or unexpected calcification. Skull: The patient's known multiple lytic skull lesions show evidence of interval progression with posterior midline lesion measuring 2.4 cm in long dimension today compared to 2.1 cm previously. Other: None. CT MAXILLOFACIAL FINDINGS Osseous: No fracture or mandibular dislocation. Advanced degenerative changes are noted in the TMJ bilaterally. Chronic type 2 dens fracture as evaluated on cervical spine CT of 10/09/2017. Orbits: Unremarkable. Sinuses: Paranasal sinuses and mastoid air cells are clear. Soft tissues: There is edema and stranding in the lower jaw, right greater than left extending under the chin. This is been incompletely visualized on the maxillofacial CT. Superinfection of the fluid cannot be excluded and assessment is limited by lack of intravenous contrast material.  IMPRESSION: 1. No acute intracranial abnormality. Atrophy with chronic small vessel white matter ischemic disease. 2. Multiple lytic skull lesions as seen previously, slightly progressed in the interval. 3. Chronic type 2 dens fracture as better characterized on cervical spine CT of 10/09/2017. Alignment appears similar today. 4. Edema and stranding in the soft tissues of the lower jaw, right greater than left and extending down under the chin and posteriorly up medial to the right mandibular angle. These changes have been incompletely visualized on the maxillofacial CT. No definite dental source of these changes although such cannot be completely excluded on this study. CT imaging with intravenous contrast, if possible would likely prove helpful to further assess. Consider performing maxillofacial/neck CT as the abnormal soft tissue attenuation tracks into the anterior neck. Electronically Signed   By: Misty Stanley M.D.   On: 02/23/2018 21:58   Ct Abdomen Pelvis W Contrast  Result Date: 02/23/2018 CLINICAL DATA:  Sepsis EXAM: CT ABDOMEN AND PELVIS WITH CONTRAST TECHNIQUE: Multidetector CT imaging of the abdomen and pelvis was performed using the standard protocol following bolus administration of intravenous contrast. CONTRAST:  32m ISOVUE-300 IOPAMIDOL (ISOVUE-300) INJECTION 61% COMPARISON:  12/18/2017 head CT FINDINGS: Lower chest: Thoracic aortic atherosclerosis with left main and three-vessel coronary arteriosclerosis. Heart size is normal without pericardial effusion or thickening. No acute pulmonary abnormality. Hepatobiliary: Status post cholecystectomy with intra and extrahepatic ductal dilatation likely on the basis of reservoir effect from prior cholecystectomy. No choledocholithiasis or definite occluding mass. Tiny too small to characterize hypodensities in the left hepatic lobe are noted statistically consistent with cysts or hemangiomata. These measure up to 4 mm. Pancreas: Atrophic without  ductal dilatation or mass. No inflammation. Spleen: Normal size spleen without acute abnormality. Adrenals/Urinary Tract: Normal  bilateral adrenal glands. Mild renal cortical thinning with bilateral renal cysts some which are too small to characterize. The largest however arises off the interpolar left kidney measuring approximately 3.3 x 7 x 6.7 cm. Stomach/Bowel: Small hiatal hernia. Decompressed stomach with normal duodenal sweep. Normal ligament of Treitz position. Mild fluid-filled distention of jejunal loops may reflect mild small bowel enteritis. No mechanical bowel obstruction. Moderate stool retention within the colon without obstruction or inflammation. The appendix is not confidently identified but no pericecal inflammation is seen. Vascular/Lymphatic: Moderate aortoiliac atherosclerosis without aneurysm. No lymphadenopathy. Reproductive: Enlarged prostate gland measuring 6 x 6.2 x 7.3 cm. Other: No free air or free fluid. Musculoskeletal: Dextroconvex curvature of the mid lumbar spine with redemonstration of lytic abnormality involving the left sacral ala with destruction noted as before. IMPRESSION: 1. Left main and three-vessel coronary arteriosclerosis. 2. Status post cholecystectomy with intra and extrahepatic ductal dilatation likely on the basis of reservoir effect from prior cholecystectomy. 3. Tiny too small to characterize hypodensities in the left hepatic lobe statistically consistent with cysts or hemangiomata. 4. Bilateral renal cysts some which are too small to characterize. No obstructive uropathy. 5. Mild fluid-filled distention of jejunal loops may reflect a mild small bowel enteritis. No mechanical bowel obstruction. 6. Stable lytic abnormality of the left sacral ala. 7. Prostatomegaly. Electronically Signed   By: Ashley Royalty M.D.   On: 02/23/2018 18:44   Dg Chest Port 1 View  Result Date: 02/23/2018 CLINICAL DATA:  84 y/o M; several days of weakness. History of non-Hodgkin's  lymphoma. EXAM: PORTABLE CHEST 1 VIEW COMPARISON:  07/15/2017 chest radiograph. 12/28/2017 PET-CT. FINDINGS: Expansile left anterior fourth rib lesion as seen on prior PET-CT. No consolidation, effusion, or pneumothorax. Platelike atelectasis at the lung bases and small right basilar calcified granuloma. Stable normal cardiac silhouette. Calcific aortic atherosclerosis. No acute osseous abnormality is evident. IMPRESSION: No acute pulmonary process identified. Electronically Signed   By: Kristine Garbe M.D.   On: 02/23/2018 16:54   Ct Maxillofacial Wo Contrast  Result Date: 02/23/2018 CLINICAL DATA:  Facial swelling. Undergoing treatment for diffuse large B-cell lymphoma. EXAM: CT HEAD WITHOUT CONTRAST CT MAXILLOFACIAL WITHOUT CONTRAST TECHNIQUE: Multidetector CT imaging of the head and maxillofacial structures were performed using the standard protocol without intravenous contrast. Multiplanar CT image reconstructions of the maxillofacial structures were also generated. COMPARISON:  Head CT 11/17/2017 FINDINGS: CT HEAD FINDINGS Brain: There is no evidence for acute hemorrhage, hydrocephalus, mass lesion, or abnormal extra-axial fluid collection. No definite CT evidence for acute infarction. Diffuse loss of parenchymal volume is consistent with atrophy. Patchy low attenuation in the deep hemispheric and periventricular white matter is nonspecific, but likely reflects chronic microvascular ischemic demyelination. Vascular: No hyperdense vessel or unexpected calcification. Skull: The patient's known multiple lytic skull lesions show evidence of interval progression with posterior midline lesion measuring 2.4 cm in long dimension today compared to 2.1 cm previously. Other: None. CT MAXILLOFACIAL FINDINGS Osseous: No fracture or mandibular dislocation. Advanced degenerative changes are noted in the TMJ bilaterally. Chronic type 2 dens fracture as evaluated on cervical spine CT of 10/09/2017. Orbits:  Unremarkable. Sinuses: Paranasal sinuses and mastoid air cells are clear. Soft tissues: There is edema and stranding in the lower jaw, right greater than left extending under the chin. This is been incompletely visualized on the maxillofacial CT. Superinfection of the fluid cannot be excluded and assessment is limited by lack of intravenous contrast material. IMPRESSION: 1. No acute intracranial abnormality. Atrophy with chronic small vessel white  matter ischemic disease. 2. Multiple lytic skull lesions as seen previously, slightly progressed in the interval. 3. Chronic type 2 dens fracture as better characterized on cervical spine CT of 10/09/2017. Alignment appears similar today. 4. Edema and stranding in the soft tissues of the lower jaw, right greater than left and extending down under the chin and posteriorly up medial to the right mandibular angle. These changes have been incompletely visualized on the maxillofacial CT. No definite dental source of these changes although such cannot be completely excluded on this study. CT imaging with intravenous contrast, if possible would likely prove helpful to further assess. Consider performing maxillofacial/neck CT as the abnormal soft tissue attenuation tracks into the anterior neck. Electronically Signed   By: Misty Stanley M.D.   On: 02/23/2018 21:58    Procedures .Critical Care Performed by: Hayden Rasmussen, MD Authorized by: Hayden Rasmussen, MD   Critical care provider statement:    Critical care time (minutes):  45   Critical care time was exclusive of:  Separately billable procedures and treating other patients   Critical care was necessary to treat or prevent imminent or life-threatening deterioration of the following conditions:  Sepsis   Critical care was time spent personally by me on the following activities:  Discussions with consultants, evaluation of patient's response to treatment, examination of patient, ordering and performing  treatments and interventions, ordering and review of laboratory studies, ordering and review of radiographic studies, pulse oximetry, re-evaluation of patient's condition, obtaining history from patient or surrogate, review of old charts and development of treatment plan with patient or surrogate   I assumed direction of critical care for this patient from another provider in my specialty: no     (including critical care time)  Medications Ordered in ED Medications  sodium chloride 0.9 % bolus 1,000 mL ( Intravenous Stopped 02/23/18 1726)  acetaminophen (TYLENOL) tablet 650 mg (650 mg Oral Given 02/23/18 1745)  iopamidol (ISOVUE-300) 61 % injection 100 mL (80 mLs Intravenous Contrast Given 02/23/18 1808)  cefTRIAXone (ROCEPHIN) 1 g in sodium chloride 0.9 % 100 mL IVPB ( Intravenous Stopped 02/23/18 1944)  clindamycin (CLEOCIN) IVPB 600 mg ( Intravenous Stopped 02/23/18 2246)     Initial Impression / Assessment and Plan / ED Course  I have reviewed the triage vital signs and the nursing notes.  Pertinent labs & imaging results that were available during my care of the patient were reviewed by me and considered in my medical decision making (see chart for details).  Clinical Course as of Feb 25 1511  Fri Feb 23, 2018  1613 EKG is sinus rhythm with PACs rate of 95 left axis deviation low voltage similar to 4/19   [MB]  55 Was just informed that the patient was retemp'd and now has a temperature of 101.0.  I have ordered lactate blood cultures.  Possible sepsis candidate now.   [MB]  5269 84 year old male with history of B-cell non-Hodgkin's lymphoma to bone here with increased malaise and dizziness over 1 week plus.  He is now found to have a fever and he has an elevated WBCs of 20,000.  His lactate is elevated to 2.3.  His chemistries show a slight bump in his AST ALT and T bili but a normal alk phos.  Chest x-ray did not show an obvious infiltrate.  I put him in for a CT abdomen and pelvis  and still waiting on the urine.  Getting IV fluids.  Holding on antibiotics have no  source yet.   [MB]  1942 Patient states he is feeling better and hoping to go home.  He does have this fever and elevated white count but the family said he has been having some dental problems.  The urine showed a possible infection.  We will try to get him up and ambulate him and see how well he does not see if he would be appropriate for him to go home.   [MB]  2057 Patient was able to get up with assistance and ambulated fairly well for his age.  Family now are afraid to take him home and wondering if his tooth is affecting his ability to take anything by mouth or even if he had a stroke.  It seems they are very reticent to consider taking him home and may be better that he gets admitted.  I am putting her in for a CT head and max face.   [MB]  2302 Discussed with Dr. Si Raider hospitalist at [facility].  He does not feel comfortable admitting the patient as we lack any dental coverage.  I reviewed this with the family and the son is asking if we could look to [facility].   [MB]  2321 Discussed with [facility] who says they are not can have any medical beds until at least _0 /15/19 [place], Jonel Sick C, MD 02/24/18 1512    Hayden Rasmussen, MD 03/03/18 1744

## 2018-02-23 NOTE — ED Notes (Signed)
Pt ambulated with EMT with assist. MD aware

## 2018-02-23 NOTE — ED Notes (Signed)
ED Provider at bedside. 

## 2018-02-23 NOTE — Discharge Instructions (Addendum)
You were evaluated in the emergency department for weakness and not eating or drinking.  You had a fever when you were here and your body shows its fighting an infection.  Your CAT scan showed that you may have some dental infection and if you were given IV antibiotics.  You were offered admission but you felt that you would do better going home.  It will be important for you to continue to take the antibiotics and drink as much fluids as you can.  Please follow-up with a dentist as soon as possible and return if any worsening symptoms.

## 2018-02-26 ENCOUNTER — Telehealth (HOSPITAL_BASED_OUTPATIENT_CLINIC_OR_DEPARTMENT_OTHER): Payer: Self-pay | Admitting: Emergency Medicine

## 2018-02-26 ENCOUNTER — Encounter (HOSPITAL_COMMUNITY): Payer: Self-pay

## 2018-02-26 ENCOUNTER — Inpatient Hospital Stay (HOSPITAL_COMMUNITY)
Admission: EM | Admit: 2018-02-26 | Discharge: 2018-03-01 | DRG: 872 | Disposition: A | Discharge status: Home Health | Payer: Medicare Other | Attending: Family Medicine | Admitting: Family Medicine

## 2018-02-26 ENCOUNTER — Telehealth: Payer: Self-pay | Admitting: Hematology & Oncology

## 2018-02-26 ENCOUNTER — Telehealth: Payer: Self-pay | Admitting: Family Medicine

## 2018-02-26 DIAGNOSIS — N183 Chronic kidney disease, stage 3 (moderate): Secondary | ICD-10-CM | POA: Diagnosis present

## 2018-02-26 DIAGNOSIS — I129 Hypertensive chronic kidney disease with stage 1 through stage 4 chronic kidney disease, or unspecified chronic kidney disease: Secondary | ICD-10-CM | POA: Diagnosis not present

## 2018-02-26 DIAGNOSIS — D631 Anemia in chronic kidney disease: Secondary | ICD-10-CM | POA: Diagnosis present

## 2018-02-26 DIAGNOSIS — J9811 Atelectasis: Secondary | ICD-10-CM | POA: Diagnosis not present

## 2018-02-26 DIAGNOSIS — D696 Thrombocytopenia, unspecified: Secondary | ICD-10-CM | POA: Diagnosis present

## 2018-02-26 DIAGNOSIS — Z87891 Personal history of nicotine dependence: Secondary | ICD-10-CM

## 2018-02-26 DIAGNOSIS — B954 Other streptococcus as the cause of diseases classified elsewhere: Secondary | ICD-10-CM | POA: Diagnosis present

## 2018-02-26 DIAGNOSIS — Z6822 Body mass index (BMI) 22.0-22.9, adult: Secondary | ICD-10-CM

## 2018-02-26 DIAGNOSIS — E440 Moderate protein-calorie malnutrition: Secondary | ICD-10-CM | POA: Diagnosis present

## 2018-02-26 DIAGNOSIS — N182 Chronic kidney disease, stage 2 (mild): Secondary | ICD-10-CM | POA: Diagnosis not present

## 2018-02-26 DIAGNOSIS — R7881 Bacteremia: Secondary | ICD-10-CM | POA: Diagnosis not present

## 2018-02-26 DIAGNOSIS — Z79899 Other long term (current) drug therapy: Secondary | ICD-10-CM

## 2018-02-26 DIAGNOSIS — Z8249 Family history of ischemic heart disease and other diseases of the circulatory system: Secondary | ICD-10-CM | POA: Diagnosis not present

## 2018-02-26 DIAGNOSIS — Z923 Personal history of irradiation: Secondary | ICD-10-CM | POA: Diagnosis not present

## 2018-02-26 DIAGNOSIS — D72829 Elevated white blood cell count, unspecified: Secondary | ICD-10-CM | POA: Diagnosis not present

## 2018-02-26 DIAGNOSIS — N179 Acute kidney failure, unspecified: Secondary | ICD-10-CM | POA: Diagnosis not present

## 2018-02-26 DIAGNOSIS — M48061 Spinal stenosis, lumbar region without neurogenic claudication: Secondary | ICD-10-CM | POA: Diagnosis not present

## 2018-02-26 DIAGNOSIS — R627 Adult failure to thrive: Secondary | ICD-10-CM | POA: Diagnosis present

## 2018-02-26 DIAGNOSIS — I10 Essential (primary) hypertension: Secondary | ICD-10-CM | POA: Diagnosis present

## 2018-02-26 DIAGNOSIS — M1990 Unspecified osteoarthritis, unspecified site: Secondary | ICD-10-CM | POA: Diagnosis present

## 2018-02-26 DIAGNOSIS — B9689 Other specified bacterial agents as the cause of diseases classified elsewhere: Secondary | ICD-10-CM | POA: Diagnosis not present

## 2018-02-26 DIAGNOSIS — Z8673 Personal history of transient ischemic attack (TIA), and cerebral infarction without residual deficits: Secondary | ICD-10-CM

## 2018-02-26 DIAGNOSIS — Z85038 Personal history of other malignant neoplasm of large intestine: Secondary | ICD-10-CM

## 2018-02-26 DIAGNOSIS — C8330 Diffuse large B-cell lymphoma, unspecified site: Secondary | ICD-10-CM | POA: Diagnosis present

## 2018-02-26 DIAGNOSIS — Z79891 Long term (current) use of opiate analgesic: Secondary | ICD-10-CM

## 2018-02-26 DIAGNOSIS — Z9221 Personal history of antineoplastic chemotherapy: Secondary | ICD-10-CM | POA: Diagnosis not present

## 2018-02-26 DIAGNOSIS — Z9049 Acquired absence of other specified parts of digestive tract: Secondary | ICD-10-CM | POA: Diagnosis not present

## 2018-02-26 DIAGNOSIS — Z820 Family history of epilepsy and other diseases of the nervous system: Secondary | ICD-10-CM

## 2018-02-26 DIAGNOSIS — C8590 Non-Hodgkin lymphoma, unspecified, unspecified site: Secondary | ICD-10-CM | POA: Diagnosis not present

## 2018-02-26 DIAGNOSIS — C8580 Other specified types of non-Hodgkin lymphoma, unspecified site: Secondary | ICD-10-CM

## 2018-02-26 LAB — BLOOD CULTURE ID PANEL (REFLEXED)
ACINETOBACTER BAUMANNII: NOT DETECTED
CANDIDA ALBICANS: NOT DETECTED
Candida glabrata: NOT DETECTED
Candida krusei: NOT DETECTED
Candida parapsilosis: NOT DETECTED
Candida tropicalis: NOT DETECTED
ENTEROBACTERIACEAE SPECIES: NOT DETECTED
ENTEROCOCCUS SPECIES: NOT DETECTED
ESCHERICHIA COLI: NOT DETECTED
Enterobacter cloacae complex: NOT DETECTED
Haemophilus influenzae: NOT DETECTED
KLEBSIELLA OXYTOCA: NOT DETECTED
Klebsiella pneumoniae: NOT DETECTED
LISTERIA MONOCYTOGENES: NOT DETECTED
Neisseria meningitidis: NOT DETECTED
Proteus species: NOT DETECTED
Pseudomonas aeruginosa: NOT DETECTED
STREPTOCOCCUS AGALACTIAE: NOT DETECTED
STREPTOCOCCUS PYOGENES: NOT DETECTED
Serratia marcescens: NOT DETECTED
Staphylococcus aureus (BCID): NOT DETECTED
Staphylococcus species: NOT DETECTED
Streptococcus pneumoniae: NOT DETECTED
Streptococcus species: DETECTED — AB

## 2018-02-26 LAB — CBC WITH DIFFERENTIAL/PLATELET
Abs Immature Granulocytes: 0.18 10*3/uL — ABNORMAL HIGH (ref 0.00–0.07)
BASOS ABS: 0 10*3/uL (ref 0.0–0.1)
Basophils Relative: 0 %
EOS PCT: 0 %
Eosinophils Absolute: 0 10*3/uL (ref 0.0–0.5)
HCT: 35.3 % — ABNORMAL LOW (ref 39.0–52.0)
HEMOGLOBIN: 11.7 g/dL — AB (ref 13.0–17.0)
Immature Granulocytes: 1 %
LYMPHS PCT: 2 %
Lymphs Abs: 0.2 10*3/uL — ABNORMAL LOW (ref 0.7–4.0)
MCH: 29.8 pg (ref 26.0–34.0)
MCHC: 33.1 g/dL (ref 30.0–36.0)
MCV: 89.8 fL (ref 80.0–100.0)
Monocytes Absolute: 0.5 10*3/uL (ref 0.1–1.0)
Monocytes Relative: 4 %
NEUTROS PCT: 93 %
NRBC: 0 % (ref 0.0–0.2)
Neutro Abs: 12.7 10*3/uL — ABNORMAL HIGH (ref 1.7–7.7)
Platelets: 31 10*3/uL — ABNORMAL LOW (ref 150–400)
RBC: 3.93 MIL/uL — AB (ref 4.22–5.81)
RDW: 15.2 % (ref 11.5–15.5)
WBC: 13.7 10*3/uL — ABNORMAL HIGH (ref 4.0–10.5)

## 2018-02-26 LAB — COMPREHENSIVE METABOLIC PANEL
ALT: 33 U/L (ref 0–44)
ANION GAP: 13 (ref 5–15)
AST: 21 U/L (ref 15–41)
Albumin: 2.9 g/dL — ABNORMAL LOW (ref 3.5–5.0)
Alkaline Phosphatase: 55 U/L (ref 38–126)
BUN: 66 mg/dL — ABNORMAL HIGH (ref 8–23)
CHLORIDE: 99 mmol/L (ref 98–111)
CO2: 20 mmol/L — ABNORMAL LOW (ref 22–32)
Calcium: 8.4 mg/dL — ABNORMAL LOW (ref 8.9–10.3)
Creatinine, Ser: 2.13 mg/dL — ABNORMAL HIGH (ref 0.61–1.24)
GLUCOSE: 115 mg/dL — AB (ref 70–99)
POTASSIUM: 4.7 mmol/L (ref 3.5–5.1)
Sodium: 132 mmol/L — ABNORMAL LOW (ref 135–145)
TOTAL PROTEIN: 6.1 g/dL — AB (ref 6.5–8.1)
Total Bilirubin: 0.9 mg/dL (ref 0.3–1.2)

## 2018-02-26 LAB — I-STAT CG4 LACTIC ACID, ED: Lactic Acid, Venous: 2.33 mmol/L (ref 0.5–1.9)

## 2018-02-26 MED ORDER — SODIUM CHLORIDE 0.9 % IV SOLN
1.0000 g | Freq: Once | INTRAVENOUS | Status: AC
  Administered 2018-02-27: 1 g via INTRAVENOUS
  Filled 2018-02-26: qty 10

## 2018-02-26 MED ORDER — SODIUM CHLORIDE 0.9 % IV BOLUS
1000.0000 mL | Freq: Once | INTRAVENOUS | Status: AC
  Administered 2018-02-27: 1000 mL via INTRAVENOUS

## 2018-02-26 NOTE — Telephone Encounter (Signed)
Please advise. Thanks.  

## 2018-02-26 NOTE — ED Triage Notes (Signed)
Per family pt was seen at [facility] for unresponsivness was suspected to be dehydrated; family states pt has a wisdom tooth abscess. Pt states tooth was hurting but no longer hurts; Pt had blood cultures drawn Friday and culture came back positive; pt was told to come to Ed; pt denies pain and a&ox 4 on arrival. Pt uses walker/cane at home with no recent falls; vitals WNL-Monique,RN

## 2018-02-26 NOTE — Telephone Encounter (Signed)
Maudie Mercury, can you help with this? -thx

## 2018-02-26 NOTE — Telephone Encounter (Signed)
Received call from pt wife to sch ER follow up appt per discharge paperwork. Gave pt wife 11/22 at 2 pm date/time

## 2018-02-26 NOTE — Telephone Encounter (Signed)
Copied from [place] [id number]. Topic: General - Other >> Feb 26, 2018  4:24 PM Janace Aris A wrote: Reason for CRM: Pt's wife called in very emotional , wanting to know if we can refer the pt to [facility], and PT She says she is no longer able to take care of him, because she is getting older and can not bear his weight. She says he hasn't had a bath in 2 months and she feels bad.   She has a person in mind, who she does not mind her husband seeing for care.  Carmelina Paddock hopper- [phone]- fax

## 2018-02-26 NOTE — ED Provider Notes (Signed)
[facility] EMERGENCY DEPARTMENT Provider Note   CSN: [id number] Arrival date & time: 02/26/18  2202     History   Chief Complaint Chief Complaint  Patient presents with  . Blood Infection    HPI [facility] is a 84 y.o. male.  HPI  This is a 84 year old male with non-Hodgkin's lymphoma, hypertension, chronic renal insufficiency who presents with concerns for positive blood cultures.  Patient was seen and evaluated at [facility] on Friday.  At that point he was noted to be febrile and had a leukocytosis to 20.  Source of infection was not totally clear but felt to be dental in origin.  After multiple conversations with outside consultants, family declined transfer to an outside facility and requested antibiotics at home.  Patient was given Rocephin and clindamycin IV prior to discharge and was discharged with clindamycin.  Since that time the patient reports that he has been fatigued but otherwise he is feeling much better.  He has not had any further dental pain.  He does report a dry cough.  Family reports that they noted that he was talking to himself on Saturday afternoon which is abnormal for him.  T-max at home yesterday 100.0.  They received a phone call today that his blood cultures were positive and he needed to come back in.  I have reviewed his blood cultures.  They are growing out strep species.  Patient denies any chest pain, shortness of breath, abdominal pain, nausea, vomiting, further dental pain.  He is currently taking clindamycin.  Past Medical History:  Diagnosis Date  . C2 cervical fracture ([facility]) 07/2017   Type II morphology with posterior displacement--C collar (potentially indefinitely).  . Chronic lower back pain   . Chronic renal insufficiency, stage III (moderate) (HCC)    GFR 50s-60s  . Colon cancer ([facility]) 1993  . CVA (cerebral vascular accident) ([facility])    Left MCA territory.  Carotid dopplers ok, Brain MRA ok, ECHO with grd I DD.  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  RT 12/2017 helped with his pain; pt is to get chemo 01/2018.  . Goals of care, counseling/discussion 12/15/2017  . Hypertension   . Lumbar spinal stenosis    Surgery 2012 helped but in 2015 pain has returned  . Lytic bone lesions on xray 08/2017   Skull, mildly abnormal bone scan. SPEP/UPEP w/out monoclonal spike.  Dr. Marin Olp eval 08/2017: plan CT C/A/P but not done as of 11/22/17.  ED visit s/p fall 11/17/17-->multiple lytic lesions now, L sacrum destroyed by tumor, lytic lesion in pelvis. Dr. Marin Olp has arranged for bx, also plan for RT and XGeva.  . Metastasis to bone of unknown primary ([facility]) 11/27/2017   Large B cell lymphoma (bone bx).  RT 12/2017, set to get chemo 01/2018.  . Osteoarthritis   . Renal insufficiency   . Venous insufficiency of both lower extremities    made worse by amlodipine    Patient Active Problem List   Diagnosis Date Noted  . Diffuse large cell non-Hodgkin's lymphoma ([facility]) 12/15/2017  . Goals of care, counseling/discussion 12/15/2017  . Metastasis to bone of unknown primary ([facility]) 11/27/2017  . Cerebral embolism with cerebral infarction 03/15/2016  . Cryptogenic stroke ([facility]) 03/15/2016  . Stroke-like symptoms 03/14/2016  . Slurred speech   . Essential hypertension   . Stage 3 chronic kidney disease ([facility])   . Acute bronchitis 01/27/2014  .  URI (upper respiratory infection) 01/27/2014    Past Surgical History:  Procedure Laterality Date  . CHOLECYSTECTOMY  1984  . INGUINAL HERNIA REPAIR  1974   [facility]  . LUMBAR SPINE SURGERY  2012   for spinal stenosis (Dr. Jonelle Sports in [place])  . LUNG LOBECTOMY  1993   LLL per pt--worry of possible cancer but turned out to be benign.  [facility] PARTIAL COLECTOMY  1993   for colon cancer        Home Medications    Prior to Admission medications   Medication Sig Start Date End Date Taking? Authorizing Provider  amLODipine  (NORVASC) 10 MG tablet Take 1 tablet (10 mg total) by mouth daily. 01/12/18  Yes McGowen, Adrian Blackwater, MD  atenolol (TENORMIN) 100 MG tablet Take 1 tablet (100 mg total) by mouth daily. 01/12/18  Yes McGowen, Adrian Blackwater, MD  cholecalciferol (VITAMIN D) 1000 UNITS tablet Take 1,000 Units by mouth daily.   Yes [provider]  clindamycin (CLEOCIN) 300 MG capsule Take 1 capsule (300 mg total) by mouth every 6 (six) hours. 02/23/18  Yes Hayden Rasmussen, MD  oxyCODONE (OXY IR/ROXICODONE) 5 MG immediate release tablet 1-2 tabs po qid prn pain Patient taking differently: Take 5-10 mg by mouth every 6 (six) hours as needed for moderate pain.  11/22/17  Yes McGowen, Adrian Blackwater, MD  vitamin C (ASCORBIC ACID) 500 MG tablet Take 500 mg by mouth daily.   Yes [provider]  vitamin E 400 UNIT capsule Take 400 Units by mouth daily.    Yes [provider]  allopurinol (ZYLOPRIM) 100 MG tablet Take 1 tablet (100 mg total) by mouth daily. Patient not taking: Reported on 02/26/2018 01/12/18   Volanda Napoleon, MD  atorvastatin (LIPITOR) 10 MG tablet TAKE ONE TABLET BY MOUTH ONCE DAILY Patient not taking: Reported on 02/26/2018 01/25/18   Tammi Sou, MD  clopidogrel (PLAVIX) 75 MG tablet TAKE 1 TABLET BY MOUTH DAILY Patient not taking: Reported on 12/05/2017 05/19/17   Tammi Sou, MD  dexamethasone (DECADRON) 4 MG tablet Take 2 tablets (8 mg total) by mouth daily. Start the day after bendamustine chemotherapy for 2 days. Take with food. Patient not taking: Reported on 02/26/2018 01/15/18   Volanda Napoleon, MD  diclofenac sodium (VOLTAREN) 1 % GEL Apply 4 g topically 4 (four) times daily. Patient not taking: Reported on 11/22/2017 11/17/17   Albesa Seen, PA-C  famciclovir (FAMVIR) 250 MG tablet Take 1 tablet (250 mg total) by mouth daily. Patient not taking: Reported on 02/26/2018 01/12/18   Volanda Napoleon, MD  hydrochlorothiazide (MICROZIDE) 12.5 MG capsule Take 1 capsule (12.5 mg  total) by mouth daily. Patient not taking: Reported on 11/22/2017 11/09/16   Tammi Sou, MD  ondansetron (ZOFRAN) 8 MG tablet Take 1 tablet (8 mg total) by mouth 2 (two) times daily as needed for refractory nausea / vomiting. Start on day 2 after bendamustine chemo Patient not taking: Reported on 02/26/2018 01/16/18   Volanda Napoleon, MD  prochlorperazine (COMPAZINE) 10 MG tablet Take 1 tablet (10 mg total) by mouth every 6 (six) hours as needed (Nausea or vomiting). Patient not taking: Reported on 02/26/2018 01/15/18   Volanda Napoleon, MD    Family History Family History  Problem Relation Age of Onset  . Parkinson's disease Mother   . Heart attack Mother   . Cancer Brother   . Cancer Daughter     Social History Social History  Tobacco Use  . Smoking status: Former Research scientist (life sciences)  . Smokeless tobacco: Never Used  Substance Use Topics  . Alcohol use: Yes    Comment: daily, gin every night  . Drug use: No     Allergies   Patient has no known allergies.   Review of Systems Review of Systems  Constitutional: Positive for fatigue and fever.  HENT: Negative for dental problem.   Respiratory: Positive for cough. Negative for shortness of breath.   Cardiovascular: Negative for chest pain.  Gastrointestinal: Negative for abdominal pain, nausea and vomiting.  Genitourinary: Negative for dysuria.  All other systems reviewed and are negative.    Physical Exam Updated Vital Signs BP 125/76 (BP Location: Left Arm)   Pulse 66   Temp 97.7 F (36.5 C) (Oral)   Resp 18   SpO2 99%   Physical Exam  Constitutional: He is oriented to person, place, and time. He appears well-developed.  Elderly, nontoxic-appearing  HENT:  Head: Normocephalic and atraumatic.  Only poor dentition, multiple caps and fillings noted, fractured right lower wisdom tooth, no fullness noted under the tongue  Eyes: Pupils are equal, round, and reactive to light.  Neck: Normal range of motion. Neck supple.    Cardiovascular: Normal rate, regular rhythm and normal heart sounds.  No murmur heard. Pulmonary/Chest: Effort normal and breath sounds normal. No respiratory distress. He has no wheezes.  Abdominal: Soft. Bowel sounds are normal. There is no tenderness. There is no rebound.  Musculoskeletal: He exhibits no edema.  Lymphadenopathy:    He has no cervical adenopathy.  Neurological: He is alert and oriented to person, place, and time.  Skin: Skin is warm and dry.  Psychiatric: He has a normal mood and affect.  Nursing note and vitals reviewed.    ED Treatments / Results  Labs (all labs ordered are listed, but only abnormal results are displayed) Labs Reviewed  COMPREHENSIVE METABOLIC PANEL - Abnormal; Notable for the following components:      Result Value   Sodium 132 (*)    CO2 20 (*)    Glucose, Bld 115 (*)    BUN 66 (*)    Creatinine, Ser 2.13 (*)    Calcium 8.4 (*)    Total Protein 6.1 (*)    Albumin 2.9 (*)    All other components within normal limits  CBC WITH DIFFERENTIAL/PLATELET - Abnormal; Notable for the following components:   WBC 13.7 (*)    RBC 3.93 (*)    Hemoglobin 11.7 (*)    HCT 35.3 (*)    Platelets 31 (*)    Neutro Abs 12.7 (*)    Lymphs Abs 0.2 (*)    Abs Immature Granulocytes 0.18 (*)    All other components within normal limits  I-STAT CG4 LACTIC ACID, ED - Abnormal; Notable for the following components:   Lactic Acid, Venous 2.33 (*)    All other components within normal limits  CULTURE, BLOOD (ROUTINE X 2)  CULTURE, BLOOD (ROUTINE X 2)  URINALYSIS, ROUTINE W REFLEX MICROSCOPIC  PROTIME-INR  I-STAT CG4 LACTIC ACID, ED    EKG None  Radiology No results found.  Procedures Procedures (including critical care time)  Medications Ordered in ED Medications  sodium chloride 0.9 % bolus 1,000 mL (1,000 mLs Intravenous New Bag/Given 02/27/18 0013)  cefTRIAXone (ROCEPHIN) 1 g in sodium chloride 0.9 % 100 mL IVPB (1 g Intravenous New Bag/Given  02/27/18 0012)     Initial Impression / Assessment and Plan / ED Course  I have reviewed the triage vital signs and the nursing notes.  Pertinent labs & imaging results that were available during my care of the patient were reviewed by me and considered in my medical decision making (see chart for details).     Patient presents after receiving a phone call regarding positive blood cultures.  Blood cultures positive for strep species.  Exact speciation not available.  He is overall nontoxic-appearing and vital signs are reassuring.  He is afebrile today.  Reports much improved dental symptoms.  Did have a cough and a temperature of 100.0 as of yesterday.  He clinically appears somewhat dry.  Work-up initiated.  Repeat cultures were drawn.  Patient's lactate 2.33.  He is not hypotensive.  Creatinine is 2.1 which is up for the patient.  Likely related to dehydration.  Patient was given a liter of fluids.  White count today is 13.7.  Platelet count 31.  Most recent platelet count 2 days ago greater than 100.  Could be related to acute illness versus his known lymphoma.  Chest x-ray obtained to evaluate for possible pneumonia.  Urinalysis also obtained.  Given positive blood cultures, patient was given IV Rocephin.  We will plan for admission for repeat blood cultures, hydration, and IV antibiotics.  At this time he does not appear to be septic or in septic shock.  Nor does the patient appear to need acute dental services.  Final Clinical Impressions(s) / ED Diagnoses   Final diagnoses:  Positive blood culture  Acute kidney injury ([facility])  Thrombocytopenia [facility])    ED Discharge Orders    None       Dina Rich, Barbette Hair, MD 02/27/18 [phone]

## 2018-02-27 ENCOUNTER — Encounter (HOSPITAL_COMMUNITY): Payer: Self-pay | Admitting: Internal Medicine

## 2018-02-27 ENCOUNTER — Other Ambulatory Visit: Payer: Self-pay

## 2018-02-27 ENCOUNTER — Emergency Department (HOSPITAL_COMMUNITY): Payer: Medicare Other

## 2018-02-27 DIAGNOSIS — B954 Other streptococcus as the cause of diseases classified elsewhere: Secondary | ICD-10-CM | POA: Diagnosis present

## 2018-02-27 DIAGNOSIS — Z85038 Personal history of other malignant neoplasm of large intestine: Secondary | ICD-10-CM | POA: Diagnosis not present

## 2018-02-27 DIAGNOSIS — Z8673 Personal history of transient ischemic attack (TIA), and cerebral infarction without residual deficits: Secondary | ICD-10-CM | POA: Diagnosis not present

## 2018-02-27 DIAGNOSIS — D696 Thrombocytopenia, unspecified: Secondary | ICD-10-CM | POA: Diagnosis present

## 2018-02-27 DIAGNOSIS — N182 Chronic kidney disease, stage 2 (mild): Secondary | ICD-10-CM | POA: Diagnosis present

## 2018-02-27 DIAGNOSIS — R7881 Bacteremia: Secondary | ICD-10-CM | POA: Diagnosis present

## 2018-02-27 DIAGNOSIS — M48061 Spinal stenosis, lumbar region without neurogenic claudication: Secondary | ICD-10-CM | POA: Diagnosis present

## 2018-02-27 DIAGNOSIS — B9689 Other specified bacterial agents as the cause of diseases classified elsewhere: Secondary | ICD-10-CM | POA: Diagnosis present

## 2018-02-27 DIAGNOSIS — Z923 Personal history of irradiation: Secondary | ICD-10-CM | POA: Diagnosis not present

## 2018-02-27 DIAGNOSIS — N179 Acute kidney failure, unspecified: Secondary | ICD-10-CM | POA: Insufficient documentation

## 2018-02-27 DIAGNOSIS — Z79891 Long term (current) use of opiate analgesic: Secondary | ICD-10-CM | POA: Diagnosis not present

## 2018-02-27 DIAGNOSIS — Z9049 Acquired absence of other specified parts of digestive tract: Secondary | ICD-10-CM | POA: Diagnosis not present

## 2018-02-27 DIAGNOSIS — C8590 Non-Hodgkin lymphoma, unspecified, unspecified site: Secondary | ICD-10-CM | POA: Diagnosis present

## 2018-02-27 DIAGNOSIS — I129 Hypertensive chronic kidney disease with stage 1 through stage 4 chronic kidney disease, or unspecified chronic kidney disease: Secondary | ICD-10-CM | POA: Diagnosis present

## 2018-02-27 DIAGNOSIS — N183 Chronic kidney disease, stage 3 (moderate): Secondary | ICD-10-CM | POA: Diagnosis present

## 2018-02-27 DIAGNOSIS — Z6822 Body mass index (BMI) 22.0-22.9, adult: Secondary | ICD-10-CM | POA: Diagnosis not present

## 2018-02-27 DIAGNOSIS — E440 Moderate protein-calorie malnutrition: Secondary | ICD-10-CM | POA: Diagnosis present

## 2018-02-27 DIAGNOSIS — Z9221 Personal history of antineoplastic chemotherapy: Secondary | ICD-10-CM | POA: Diagnosis not present

## 2018-02-27 DIAGNOSIS — Z820 Family history of epilepsy and other diseases of the nervous system: Secondary | ICD-10-CM | POA: Diagnosis not present

## 2018-02-27 DIAGNOSIS — Z8249 Family history of ischemic heart disease and other diseases of the circulatory system: Secondary | ICD-10-CM | POA: Diagnosis not present

## 2018-02-27 DIAGNOSIS — Z87891 Personal history of nicotine dependence: Secondary | ICD-10-CM | POA: Diagnosis not present

## 2018-02-27 DIAGNOSIS — M1990 Unspecified osteoarthritis, unspecified site: Secondary | ICD-10-CM | POA: Diagnosis present

## 2018-02-27 DIAGNOSIS — R627 Adult failure to thrive: Secondary | ICD-10-CM | POA: Diagnosis present

## 2018-02-27 DIAGNOSIS — D631 Anemia in chronic kidney disease: Secondary | ICD-10-CM | POA: Diagnosis present

## 2018-02-27 LAB — CBC
HCT: 34.8 % — ABNORMAL LOW (ref 39.0–52.0)
Hemoglobin: 11.2 g/dL — ABNORMAL LOW (ref 13.0–17.0)
MCH: 28.6 pg (ref 26.0–34.0)
MCHC: 32.2 g/dL (ref 30.0–36.0)
MCV: 88.8 fL (ref 80.0–100.0)
NRBC: 0 % (ref 0.0–0.2)
PLATELETS: 161 10*3/uL (ref 150–400)
RBC: 3.92 MIL/uL — AB (ref 4.22–5.81)
RDW: 15.3 % (ref 11.5–15.5)
WBC: 13.5 10*3/uL — AB (ref 4.0–10.5)

## 2018-02-27 LAB — BASIC METABOLIC PANEL
ANION GAP: 12 (ref 5–15)
BUN: 64 mg/dL — ABNORMAL HIGH (ref 8–23)
CALCIUM: 8.2 mg/dL — AB (ref 8.9–10.3)
CO2: 21 mmol/L — ABNORMAL LOW (ref 22–32)
CREATININE: 1.82 mg/dL — AB (ref 0.61–1.24)
Chloride: 101 mmol/L (ref 98–111)
GFR, EST AFRICAN AMERICAN: 34 mL/min — AB (ref >60)
GFR, EST NON AFRICAN AMERICAN: 30 mL/min — AB (ref >60)
Glucose, Bld: 109 mg/dL — ABNORMAL HIGH (ref 70–99)
Potassium: 4.4 mmol/L (ref 3.5–5.1)
Sodium: 134 mmol/L — ABNORMAL LOW (ref 135–145)

## 2018-02-27 LAB — PROTIME-INR
INR: 0.95
Prothrombin Time: 12.6 seconds (ref 11.4–15.2)

## 2018-02-27 LAB — PATHOLOGIST SMEAR REVIEW

## 2018-02-27 LAB — TROPONIN I: Troponin I: <0.03 ng/mL (ref <0.03)

## 2018-02-27 LAB — MAGNESIUM: MAGNESIUM: 2.2 mg/dL (ref 1.7–2.4)

## 2018-02-27 LAB — I-STAT CG4 LACTIC ACID, ED: LACTIC ACID, VENOUS: 0.95 mmol/L (ref 0.5–1.9)

## 2018-02-27 LAB — TSH: TSH: 0.15 u[IU]/mL — AB (ref 0.350–4.500)

## 2018-02-27 MED ORDER — AMLODIPINE BESYLATE 5 MG PO TABS
10.0000 mg | ORAL_TABLET | Freq: Every day | ORAL | Status: DC
  Administered 2018-02-27 – 2018-03-01 (×3): 10 mg via ORAL
  Filled 2018-02-27 (×3): qty 2

## 2018-02-27 MED ORDER — VITAMIN C 500 MG PO TABS
500.0000 mg | ORAL_TABLET | Freq: Every day | ORAL | Status: DC
  Administered 2018-02-27 – 2018-03-01 (×3): 500 mg via ORAL
  Filled 2018-02-27 (×3): qty 1

## 2018-02-27 MED ORDER — SODIUM CHLORIDE 0.9 % IV SOLN
INTRAVENOUS | Status: AC
  Administered 2018-02-27 (×2): via INTRAVENOUS

## 2018-02-27 MED ORDER — ACETAMINOPHEN 650 MG RE SUPP: 650.0000 mg | Freq: Four times a day (QID) | RECTAL | Status: DC | PRN

## 2018-02-27 MED ORDER — ATENOLOL 50 MG PO TABS
100.0000 mg | ORAL_TABLET | Freq: Every day | ORAL | Status: DC
  Administered 2018-02-27 – 2018-03-01 (×3): 100 mg via ORAL
  Filled 2018-02-27: qty 2
  Filled 2018-02-27: qty 4
  Filled 2018-02-27: qty 2
  Filled 2018-02-27: qty 4
  Filled 2018-02-27: qty 2

## 2018-02-27 MED ORDER — OXYCODONE HCL 5 MG PO TABS: 5.0000 mg | ORAL_TABLET | Freq: Four times a day (QID) | ORAL | Status: DC | PRN

## 2018-02-27 MED ORDER — ACETAMINOPHEN 325 MG PO TABS: 650.0000 mg | ORAL_TABLET | Freq: Four times a day (QID) | ORAL | Status: DC | PRN

## 2018-02-27 MED ORDER — SODIUM CHLORIDE 0.9 % IV SOLN
1.0000 g | Freq: Once | INTRAVENOUS | Status: AC
  Administered 2018-02-27: 1 g via INTRAVENOUS
  Filled 2018-02-27: qty 10

## 2018-02-27 MED ORDER — ONDANSETRON HCL 4 MG PO TABS: 4.0000 mg | ORAL_TABLET | Freq: Four times a day (QID) | ORAL | Status: DC | PRN

## 2018-02-27 MED ORDER — ONDANSETRON HCL 4 MG/2ML IJ SOLN: 4.0000 mg | Freq: Four times a day (QID) | INTRAMUSCULAR | Status: DC | PRN

## 2018-02-27 MED ORDER — SODIUM CHLORIDE 0.9 % IV SOLN
2.0000 g | INTRAVENOUS | Status: DC
  Administered 2018-02-27 – 2018-03-01 (×2): 2 g via INTRAVENOUS
  Filled 2018-02-27 (×3): qty 20

## 2018-02-27 NOTE — Plan of Care (Signed)
Patient is a 84 year old male with complex medical problems including history of diffuse B-cell lymphoma on chemotherapy who was seen in the ER 4 days ago with fever, chills, leukocytosis.  He underwent CT of the abdomen, CT of the head and maxillofacial CT.  There was concern for dental infection.  At that visit blood cultures were drawn and the patient was discharged home on clindamycin.  Patient blood cultures came back positive for Streptococcus and therefore he was called and come back to the emergency room and was admitted this morning.  Patient seen and examined.  Vitals, labs, imaging reviewed.  Currently on treatment with ceftriaxone.  Final blood cultures are still pending.  Platelet count was low yesterday but better today.  Patient at this time says he feels better.  Plan of care discussed with the patient and he verbalized understanding.

## 2018-02-27 NOTE — H&P (Signed)
History and Physical    Brendan Gonzalez Brendan Gonzalez:[id number] DOB: 11-29-1919 DOA: 02/26/2018  PCP: Brendan Sou, MD  Patient coming from: Home.  Chief Complaint: Positive blood cultures.  HPI: Brendan Gonzalez is a 84 y.o. 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  Lactate was mildly elevated.  Patient underwent CT of the abdomen CT head and maxillofacial.  There was concern for dental infection and CT maxillofacial showed some edema around the lower jaw.  During that visit patient had blood cultures drawn and eventually patient was discharged home on clindamycin.  Patient was called back for cultures growing positive cocci and strep species.  As per the family patient has not been eating well since last 1 week.  Patient denies any chest pain shortness of breath nausea vomiting or diarrhea.  He says his appetite has been poor.  ED Course: In the ER patient's labs show worsening creatinine from 1.24 days ago to 2.1 now lactate was elevated which improved with fluids.  Still has leukocytosis.  Patient was started on ceftriaxone and admitted for bacteremia and acute renal failure.  During this visit patient is noted to have a platelet count of 30 but has no bleeding symptoms and patient is not febrile.  Review of Systems: As per HPI, rest all negative.   Past Medical History:  Diagnosis Date  . C2 cervical fracture ([place]) 07/2017   Type II morphology with posterior displacement--C collar (potentially indefinitely).  . Chronic lower back pain   . Chronic renal insufficiency, stage III (moderate) (HCC)    GFR 50s-60s  . Colon cancer ([place]) 1993  . CVA (cerebral vascular accident) ([place])    Left MCA territory.  Carotid dopplers ok, Brain MRA ok, ECHO with grd I DD.  [name] Diffuse large cell  non-Hodgkin's lymphoma ([place]) 12/15/2017   B cell NHL involving bones only as of 01/2018.  RT 12/2017 helped with his pain; pt is to get chemo 01/2018.  . Goals of care, counseling/discussion 12/15/2017  . Hypertension   . Lumbar spinal stenosis    Surgery 2012 helped but in 2015 pain has returned  . Lytic bone lesions on xray 08/2017   Skull, mildly abnormal bone scan. SPEP/UPEP w/out monoclonal spike.  Brendan Gonzalez eval 08/2017: plan CT C/A/P but not done as of 11/22/17.  ED visit s/p fall 11/17/17-->multiple lytic lesions now, L sacrum destroyed by tumor, lytic lesion in pelvis. Brendan Gonzalez has arranged for bx, also plan for RT and XGeva.  . 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  RT 12/2017, set to get chemo 01/2018.  . Osteoarthritis   . Renal insufficiency   . Venous insufficiency of both lower extremities    made worse by amlodipine    Past Surgical History:  Procedure Laterality Date  . CHOLECYSTECTOMY  1984  . INGUINAL HERNIA REPAIR  1974   [facility]  . LUMBAR SPINE SURGERY  2012   for spinal stenosis (Dr. Jonelle Sports in [place]  . LUNG LOBECTOMY  1993   LLL per pt--worry of possible cancer but turned out to be benign.  [name] PARTIAL COLECTOMY  1993   for colon cancer     reports that he has quit smoking.  He has never used smokeless tobacco. He reports that he drinks alcohol. He reports that he does not use drugs.  No Known Allergies  Family History  Problem Relation Age of Onset  . Parkinson's disease Mother   . Heart attack Mother   . Cancer Brother   . Cancer Daughter     Prior to Admission medications   Medication Sig Start Date End Date Taking? Authorizing Provider  amLODipine (NORVASC) 10 MG tablet Take 1 tablet (10 mg total) by mouth daily. 01/12/18  Yes McGowen, Adrian Blackwater, MD  atenolol (TENORMIN) 100 MG tablet Take 1 tablet (100 mg total) by mouth daily. 01/12/18  Yes McGowen, Adrian Blackwater, MD  cholecalciferol (VITAMIN D) 1000 UNITS  tablet Take 1,000 Units by mouth daily.   Yes [provider]  clindamycin (CLEOCIN) 300 MG capsule Take 1 capsule (300 mg total) by mouth every 6 (six) hours. 02/23/18  Yes Hayden Rasmussen, MD  oxyCODONE (OXY IR/ROXICODONE) 5 MG immediate release tablet 1-2 tabs po qid prn pain Patient taking differently: Take 5-10 mg by mouth every 6 (six) hours as needed for moderate pain.  11/22/17  Yes McGowen, Adrian Blackwater, MD  vitamin C (ASCORBIC ACID) 500 MG tablet Take 500 mg by mouth daily.   Yes [provider]  vitamin E 400 UNIT capsule Take 400 Units by mouth daily.    Yes [provider]  allopurinol (ZYLOPRIM) 100 MG tablet Take 1 tablet (100 mg total) by mouth daily. Patient not taking: Reported on 02/26/2018 01/12/18   Volanda Napoleon, MD  atorvastatin (LIPITOR) 10 MG tablet TAKE ONE TABLET BY MOUTH ONCE DAILY Patient not taking: Reported on 02/26/2018 01/25/18   Brendan Sou, MD  clopidogrel (PLAVIX) 75 MG tablet TAKE 1 TABLET BY MOUTH DAILY Patient not taking: Reported on 12/05/2017 05/19/17   Brendan Sou, MD  dexamethasone (DECADRON) 4 MG tablet Take 2 tablets (8 mg total) by mouth daily. Start the day after bendamustine chemotherapy for 2 days. Take with food. Patient not taking: Reported on 02/26/2018 01/15/18   Volanda Napoleon, MD  diclofenac sodium (VOLTAREN) 1 % GEL Apply 4 g topically 4 (four) times daily. Patient not taking: Reported on 11/22/2017 11/17/17   Albesa Seen, PA-C  famciclovir (FAMVIR) 250 MG tablet Take 1 tablet (250 mg total) by mouth daily. Patient not taking: Reported on 02/26/2018 01/12/18   Volanda Napoleon, MD  hydrochlorothiazide (MICROZIDE) 12.5 MG capsule Take 1 capsule (12.5 mg total) by mouth daily. Patient not taking: Reported on 11/22/2017 11/09/16   Brendan Sou, MD  ondansetron (ZOFRAN) 8 MG tablet Take 1 tablet (8 mg total) by mouth 2 (two) times daily as needed for refractory nausea / vomiting. Start on day 2 after  bendamustine chemo Patient not taking: Reported on 02/26/2018 01/16/18   Volanda Napoleon, MD  prochlorperazine (COMPAZINE) 10 MG tablet Take 1 tablet (10 mg total) by mouth every 6 (six) hours as needed (Nausea or vomiting). Patient not taking: Reported on 02/26/2018 01/15/18   Volanda Napoleon, MD    Physical Exam: Vitals:   02/27/18 0115 02/27/18 0130 02/27/18 0145 02/27/18 0200  BP: (!) 120/57 125/66 120/72   Pulse: (!) 59 61 62 63  Resp: 11 11 15 14   Temp:      TempSrc:      SpO2: 100% 99% 98% 100%      Constitutional: Moderately built and nourished. Vitals:   02/27/18 0115 02/27/18 0130 02/27/18 0145 02/27/18  0200  BP: (!) 120/57 125/66 120/72   Pulse: (!) 59 61 62 63  Resp: 11 11 15 14   Temp:      TempSrc:      SpO2: 100% 99% 98% 100%   Eyes: Anicteric no pallor. ENMT: No discharge from the ears eyes nose or mouth. Neck: No mass felt.  No neck rigidity.  No JVD appreciated. Respiratory: No rhonchi or crepitations. Cardiovascular: S1-S2 heard no murmurs appreciated. Abdomen: Soft nontender bowel sounds present. Musculoskeletal: No edema.  No joint effusion. Skin: Chronic skin changes. Neurologic: Alert awake oriented to place and person.  Moves all extremities. Psychiatric: Oriented to place and person.   Labs on Admission: I have personally reviewed following labs and imaging studies  CBC: Recent Labs  Lab 02/23/18 1618 02/26/18 2247  WBC 20.3* 13.7*  NEUTROABS 17.7* 12.7*  HGB 13.2 11.7*  HCT 40.8 35.3*  MCV 90.5 89.8  PLT 142* 31*   Basic Metabolic Panel: Recent Labs  Lab 02/23/18 1618 02/26/18 2247  NA 134* 132*  K 4.1 4.7  CL 96* 99  CO2 25 20*  GLUCOSE 109* 115*  BUN 27* 66*  CREATININE 1.27* 2.13*  CALCIUM 8.7* 8.4*   GFR: Estimated Creatinine Clearance: 19 mL/min (A) (by C-G formula based on SCr of 2.13 mg/dL (H)). Liver Function Tests: Recent Labs  Lab 02/23/18 1618 02/26/18 2247  AST 48* 21  ALT 68* 33  ALKPHOS 63 55    BILITOT 1.4* 0.9  PROT 6.5 6.1*  ALBUMIN 3.2* 2.9*   Recent Labs  Lab 02/23/18 1618  LIPASE 30   No results for input(s): AMMONIA in the last 168 hours. Coagulation Profile: No results for input(s): INR, PROTIME in the last 168 hours. Cardiac Enzymes: Recent Labs  Lab 02/23/18 1618  TROPONINI <0.03   BNP (last 3 results) No results for input(s): PROBNP in the last 8760 hours. HbA1C: No results for input(s): HGBA1C in the last 72 hours. CBG: No results for input(s): GLUCAP in the last 168 hours. Lipid Profile: No results for input(s): CHOL, HDL, LDLCALC, TRIG, CHOLHDL, LDLDIRECT in the last 72 hours. Thyroid Function Tests: No results for input(s): TSH, T4TOTAL, FREET4, T3FREE, THYROIDAB in the last 72 hours. Anemia Panel: No results for input(s): VITAMINB12, FOLATE, FERRITIN, TIBC, IRON, RETICCTPCT in the last 72 hours. Urine analysis:    Component Value Date/Time   COLORURINE YELLOW 02/23/2018 1739   APPEARANCEUR CLEAR 02/23/2018 1739   LABSPEC 1.010 02/23/2018 1739   PHURINE 7.5 02/23/2018 1739   GLUCOSEU NEGATIVE 02/23/2018 1739   HGBUR NEGATIVE 02/23/2018 1739   BILIRUBINUR NEGATIVE 02/23/2018 1739   KETONESUR NEGATIVE 02/23/2018 1739   PROTEINUR NEGATIVE 02/23/2018 1739   NITRITE NEGATIVE 02/23/2018 1739   LEUKOCYTESUR SMALL (A) 02/23/2018 1739   Sepsis Labs: @LABRCNTIP (procalcitonin:4,lacticidven:4) ) Recent Results (from the past 240 hour(s))  Blood Culture (routine x 2)     Status: None (Preliminary result)   Collection Time: 02/23/18  4:50 PM  Result Value Ref Range Status   Specimen Description   Final    BLOOD BLOOD RIGHT HAND Performed at [facility], [address] [place], [place] [address]    Special Requests   Final    BOTTLES DRAWN AEROBIC AND ANAEROBIC Blood Culture adequate volume Performed at [facility], [address] [place], [place] [address]    Culture  Setup Time   Final    ANAEROBIC BOTTLE ONLY GRAM  POSITIVE COCCI CRITICAL VALUE NOTED.  VALUE IS CONSISTENT WITH PREVIOUSLY REPORTED AND CALLED VALUE.    Culture   Final    NO GROWTH 3 DAYS Performed at [facility], [place] [address] [place], [place] [address]    Report Status PENDING  Incomplete  Blood Culture (routine x 2)     Status: None (Preliminary result)   Collection Time: 02/23/18  5:25 PM  Result Value Ref Range Status   Specimen Description   Final    BLOOD BLOOD LEFT ARM Performed at [facility], [address] [place], [place] [address]    Special Requests   Final    BOTTLES DRAWN AEROBIC AND ANAEROBIC Blood Culture adequate volume Performed at [facility], [address] [place], [place] [address]    Culture  Setup Time   Final    ANAEROBIC BOTTLE ONLY GRAM POSITIVE COCCI CRITICAL RESULT CALLED TO, READ BACK BY AND VERIFIED WITH: Brendan Ax RN, AT [phone] 02/26/18 BY Rush Landmark    Culture   Final    Lonell Grandchild POSITIVE COCCI CULTURE REINCUBATED FOR BETTER GROWTH Performed at [facility], [place] [address], [place], [place] [address]    Report Status PENDING  Incomplete  Blood Culture ID Panel (Reflexed)     Status: Abnormal   Collection Time: 02/23/18  5:25 PM  Result Value Ref Range Status   Enterococcus species NOT DETECTED NOT DETECTED Final   Listeria monocytogenes NOT DETECTED NOT DETECTED Final   Staphylococcus species NOT DETECTED NOT DETECTED Final   Staphylococcus aureus (BCID) NOT DETECTED NOT DETECTED Final   Streptococcus species DETECTED (A) NOT DETECTED Final    Comment: Not Enterococcus species, Streptococcus agalactiae, Streptococcus pyogenes, or Streptococcus pneumoniae. CRITICAL RESULT CALLED TO, READ BACK BY AND VERIFIED WITH: Brendan Ax RN, AT [phone] 02/26/18 BY D. VANHOOK    Streptococcus agalactiae NOT DETECTED NOT DETECTED Final   Streptococcus pneumoniae NOT DETECTED NOT DETECTED Final   Streptococcus pyogenes NOT DETECTED NOT DETECTED Final   Acinetobacter  baumannii NOT DETECTED NOT DETECTED Final   Enterobacteriaceae species NOT DETECTED NOT DETECTED Final   Enterobacter cloacae complex NOT DETECTED NOT DETECTED Final   Escherichia coli NOT DETECTED NOT DETECTED Final   Klebsiella oxytoca NOT DETECTED NOT DETECTED Final   Klebsiella pneumoniae NOT DETECTED NOT DETECTED Final   Proteus species NOT DETECTED NOT DETECTED Final   Serratia marcescens NOT DETECTED NOT DETECTED Final   Haemophilus influenzae NOT DETECTED NOT DETECTED Final   Neisseria meningitidis NOT DETECTED NOT DETECTED Final   Pseudomonas aeruginosa NOT DETECTED NOT DETECTED Final   Candida albicans NOT DETECTED NOT DETECTED Final   Candida glabrata NOT DETECTED NOT DETECTED Final   Candida krusei NOT DETECTED NOT DETECTED Final   Candida parapsilosis NOT DETECTED NOT DETECTED Final   Candida tropicalis NOT DETECTED NOT DETECTED Final    Comment: Performed at [facility], [place]. [address], [place], [place] [address]     Radiological Exams on Admission: Dg Chest 2 View  Result Date: 02/27/2018 CLINICAL DATA:  Dehydration. EXAM: CHEST - 2 VIEW COMPARISON:  February 23, 2018 FINDINGS: No pneumothorax. Mild opacity at the left base is favored represent atelectasis. A small effusions not excluded. No pneumothorax. No change in the cardiomediastinal silhouette. No new infiltrates. IMPRESSION: Atelectasis at the left base. A small associated effusion not excluded. No other acute interval change. Electronically Signed   By: Dorise Bullion III M.D   On: 02/27/2018 00:42  Assessment/Plan Principal Problem:   Bacteremia Active Problems:   Essential hypertension   Diffuse large cell non-Hodgkin's lymphoma ([place])   ARF (acute renal failure) (HCC)   Thrombocytopenia ([place])    1. Gram-positive cocci bacteremia -1 of the bottles is showing Streptococcus species.  Discussed with pharmacy and patient is placed on ceftriaxone.  Patient had tooth issues during last visit 4 days  ago which has resolved at this time.  CT scan done at that time showed some edema of the lower jaw on exam at this time does not show any tenderness.  Wait for final blood culture results including sensitivity. 2. Acute renal failure likely from poor oral intake -continue with gentle hydration follow intake output metabolic panel. 3. Thrombocytopenia -patient's platelet counts have acutely worsened.  Likely from chemotherapy or infectious process.  Patient is not bleeding.  I do not think it is TTP since patient is not febrile at this time.  We will check smear reviewed to rule out any schistocytes.  LDH is likely to be high due to lymphoma.  Follow CBC.  If platelets x2 remain low will need to get oncology input. 4. Hypertension on abnormal and amlodipine. 5. Lymphoma being followed by Brendan Gonzalez on chemotherapy last one was on February 12, 2018.   6. Anemia follow CBC. 7. Generalized weakness likely from poor oral intake and failure to thrive.  Get physical therapy consult. 8. History of stroke on Plavix and statins.  Note that patient has severe thrombocytopenia.   DVT prophylaxis: SCDs due to thrombocytopenia. Code Status: Full code as discussed with patient. Family Communication: No family at the bedside. Disposition Plan: Home. Consults called: None. Admission status: Inpatient.   Rise Patience MD [facility] Pager [phone].  If 7PM-7AM, please contact night-coverage [email or web address] Password [email or web address]  02/27/2018, 2:50 AM

## 2018-02-27 NOTE — Telephone Encounter (Signed)
Attempted to contact patients wife, VM not available. Patient currently admitted at [facility] with Bacteremia.

## 2018-02-28 DIAGNOSIS — R7881 Bacteremia: Principal | ICD-10-CM

## 2018-02-28 MED ORDER — CLOPIDOGREL BISULFATE 75 MG PO TABS
75.0000 mg | ORAL_TABLET | Freq: Every day | ORAL | Status: DC
  Administered 2018-02-28 – 2018-03-01 (×2): 75 mg via ORAL
  Filled 2018-02-28 (×2): qty 1

## 2018-02-28 MED ORDER — ZOLPIDEM TARTRATE 5 MG PO TABS
5.0000 mg | ORAL_TABLET | Freq: Once | ORAL | Status: AC
  Administered 2018-02-28: 5 mg via ORAL
  Filled 2018-02-28: qty 1

## 2018-02-28 MED ORDER — SODIUM CHLORIDE 0.9 % IV SOLN
INTRAVENOUS | Status: AC
  Administered 2018-02-28 – 2018-03-01 (×2): via INTRAVENOUS

## 2018-02-28 NOTE — Progress Notes (Signed)
[facility] PROGRESS NOTE  CHADRIC KIMBERLEY MBT:[id number] DOB: June 01, 1919 DOA: 02/26/2018 PCP: Tammi Sou, MD Dr Marin Olp  Narrative: 65 M diffuse B-cell lymphoma/chemo status post 7 XRT left sacrum destructive lesion, was on Rituxan and bendamustine 01/2018 and now is on Xgeva 120 every 3 monthly Prior C2 cervical fracture Colon cancer 1993 Lumbar spinal stenosis Osteoarthritis Postop follow-up PET scan 11/25? Acute L MCA infarct 03/16/2016 previously on Plavix CKD stage II  Patient seen HPr 11/15 found to be febrile leukocytosis 20?  Dental etiology?  -patient was offered admission but requested to go home-given Rocephin and clindamycin and sent home with clindamycin started to have a fever 11/18 blood cultures are positive growing out Streptococcus   A & Plan Gram-positive bacteremia with Streptococcus-cont ceftriaxone-expect can narrow in 24-48 hours. AKI on admission-resolved-copnt Saline 75 cc/h and rpt labs in am Thrombocytopenia-probably spurious? Labs look ok today-no fruther work-up HTN-controlled on amlodipine 10, atenolol 100 daily Large B-cell lymphoma status post chemo and XRT-stable-OP follow up L MCA 03/16/16-cont plavix 75 daily Osteoarthritis-cont Oxyir 5 q6 prn Prior colon cancer history Prior cervical fracture Malnutrition  Verlon Au, MD  [facility] Direct contact: [phone] --Via amion app OR  --[email or web address]; password [email or web address]  7PM-7AM contact night coverage as above 02/28/2018, 7:39 AM  LOS: 1 day   Consultants:    Procedures:    Antimicrobials:  Ceftriaxone since admit  Interval history/Subjective: No issues sleeping on eval no cp no fever no diarr No cough no cold  Objective:  Vitals:  Vitals:   02/27/18 2352 02/28/18 0357  BP: 123/76 122/67  Pulse: (!) 59 (!) 56  Resp: 18 16  Temp: 97.8 F (36.6 C) 98 F (36.7 C)  SpO2: 98% 97%    Exam:  . Awake coherent slow steady speech, . eomi ncat . abd soft nt nd no  rebound . cta b no added sound . s1 s 2no m   I have personally reviewed the following:   Labs:  21  BUN/creatinine 64/1.8 down from 2.1 baseline is typically 27/1 0.2-1.5)  Lactic acidosis 2.2  Platelets up to 161 from 31 hemoglobin 11.7 smear showed mild normocytic anemia  Imaging studies:  CXR showed atelectasis of left base without any other findings  CT from 11/15 reviewed showing edema and stranding of soft tissues large, right greater than left down undertaken no definite dental source  Medical tests:  n   Test discussed with performing physician:  n  Decision to obtain old records:  n  Review and summation of old records:  n  Scheduled Meds: . amLODipine  10 mg Oral Daily  . atenolol  100 mg Oral Daily  . vitamin C  500 mg Oral Daily   Continuous Infusions: . cefTRIAXone (ROCEPHIN)  IV 2 g (02/27/18 1957)    Principal Problem:   Bacteremia Active Problems:   Essential hypertension   Diffuse large cell non-Hodgkin's lymphoma ([place])   ARF (acute renal failure) (HCC)   Thrombocytopenia (HCC)   LOS: 1 day

## 2018-03-01 ENCOUNTER — Telehealth: Payer: Self-pay | Admitting: *Deleted

## 2018-03-01 LAB — CBC WITH DIFFERENTIAL/PLATELET
ABS IMMATURE GRANULOCYTES: 0.25 10*3/uL — AB (ref 0.00–0.07)
BASOS ABS: 0 10*3/uL (ref 0.0–0.1)
Basophils Relative: 0 %
EOS PCT: 2 %
Eosinophils Absolute: 0.2 10*3/uL (ref 0.0–0.5)
HEMATOCRIT: 33.1 % — AB (ref 39.0–52.0)
Hemoglobin: 10.7 g/dL — ABNORMAL LOW (ref 13.0–17.0)
Immature Granulocytes: 3 %
LYMPHS ABS: 0.3 10*3/uL — AB (ref 0.7–4.0)
Lymphocytes Relative: 3 %
MCH: 28.8 pg (ref 26.0–34.0)
MCHC: 32.3 g/dL (ref 30.0–36.0)
MCV: 89 fL (ref 80.0–100.0)
MONO ABS: 1.4 10*3/uL — AB (ref 0.1–1.0)
Monocytes Relative: 14 %
NEUTROS PCT: 78 %
Neutro Abs: 7.8 10*3/uL — ABNORMAL HIGH (ref 1.7–7.7)
Platelets: 162 10*3/uL (ref 150–400)
RBC: 3.72 MIL/uL — ABNORMAL LOW (ref 4.22–5.81)
RDW: 15.2 % (ref 11.5–15.5)
WBC: 9.9 10*3/uL (ref 4.0–10.5)
nRBC: 0 % (ref 0.0–0.2)

## 2018-03-01 LAB — BASIC METABOLIC PANEL
Anion gap: 8 (ref 5–15)
BUN: 20 mg/dL (ref 8–23)
CHLORIDE: 105 mmol/L (ref 98–111)
CO2: 21 mmol/L — AB (ref 22–32)
CREATININE: 1.13 mg/dL (ref 0.61–1.24)
Calcium: 7.6 mg/dL — ABNORMAL LOW (ref 8.9–10.3)
GFR calc non Af Amer: 53 mL/min — ABNORMAL LOW (ref >60)
Glucose, Bld: 74 mg/dL (ref 70–99)
POTASSIUM: 4 mmol/L (ref 3.5–5.1)
Sodium: 134 mmol/L — ABNORMAL LOW (ref 135–145)

## 2018-03-01 LAB — CULTURE, BLOOD (ROUTINE X 2): Special Requests: ADEQUATE

## 2018-03-01 MED ORDER — LEVOFLOXACIN 500 MG PO TABS: 500.0000 mg | ORAL_TABLET | Freq: Every day | ORAL | 0 refills | Status: AC

## 2018-03-01 NOTE — Care Management Note (Signed)
Case Management Note  Patient Details  Name: MAXIMINO COZZOLINO MRN: [id number] Date of Birth: [birth year]  Subjective/Objective:                    Action/Plan: Pt is from [place].  Pt with orders for [facility] services. CSW spoke to [place] and they use [facility] for [facility]. CM called Hoyle Sauer with [facility] and she accepted the referral.  Pts spouse aware of d/c and will provide transport home.   Expected Discharge Date:  03/01/18               Expected Discharge Plan:     In-House Referral:     Discharge planning Services     Post Acute Care Choice:    Choice offered to:     DME Arranged:    DME Agency:     HH Arranged:    HH Agency:     Status of Service:     If discussed at [employer], dates discussed:    Additional Comments:  Pollie Friar, RN 03/01/2018, 4:42 PM

## 2018-03-01 NOTE — Telephone Encounter (Signed)
Message received from pt.'s son to inform Dr. Marin Olp that pt will be discharged from the hospital tomorrow and that appts will need to be canceled for 03/02/18.  Dr. Marin Olp notified and order received for pt to keep appt for PET scan on 03/06/18 and appts with Dr. Marin Olp on 03/12/18.  Call placed back to patient's son, Brendan Gonzalez to notify him of Dr. Antonieta Pert orders.  No further questions at this time.

## 2018-03-01 NOTE — Discharge Summary (Signed)
Physician Discharge Summary  Brendan Gonzalez:[id number] DOB: 1919-08-02 DOA: 02/26/2018  PCP: Tammi Sou, MD  Admit date: 02/26/2018 Discharge date: 03/01/2018  Time spent: 35 minutes  Recommendations for Outpatient Follow-up:  1. Will need further work-up with dentistry 2. Complete 10 more days of p.o. Levaquin  Discharge Diagnoses:  Principal Problem:   Bacteremia Active Problems:   Essential hypertension   Diffuse large cell non-Hodgkin's lymphoma ([place])   ARF (acute renal failure) (HCC)   Thrombocytopenia (HCC)   Discharge Condition: Improved  Diet recommendation: Regular  Filed Weights   02/27/18 0821  Weight: 66.1 kg    History of present illness:  84 M diffuse B-cell lymphoma/chemo status post 7 XRT left sacrum destructive lesion, was on Rituxan and bendamustine 01/2018 and now is on Xgeva 120 every 3 monthly Prior C2 cervical fracture Colon cancer 1993 Lumbar spinal stenosis Osteoarthritis Postop follow-up PET scan 11/25? Acute L MCA infarct 03/16/2016 previously on Plavix CKD stage II  Patient seen HPr 11/15 found to be febrile leukocytosis 20?  Dental etiology?  -patient was offered admission but requested to go home-given Rocephin and clindamycin and sent home with clindamycin started to have a fever 11/18 blood cultures are positive growing out Streptococcus   Hospital Course:  Gram-positive bacteremia with Streptococcus intermedius-initially was on ceftriaxone and then was transitioned to p.o. Plavix prescription called in on discharge AKI on admission-resolved-was on saline 75 creatinine went down from 64/1.8 220/1.13 on discharge Thrombocytopenia-probably spurious? Labs look ok today-no fruther work-up HTN-controlled on amlodipine 10, atenolol 100 daily Large B-cell lymphoma status post chemo and XRT-stable-OP follow up L MCA 03/16/16-cont plavix 75 daily Osteoarthritis-cont Oxyir 5 q6 prn Prior colon cancer history Prior cervical  fracture Malnutrition  Discharge Exam: Vitals:   03/01/18 0844 03/01/18 1106  BP: 136/81 121/70  Pulse: 81 68  Resp:  16  Temp:  98.9 F (37.2 C)  SpO2:  95%    General: Awake alert pleasant looks much younger than stated age no distress eating drinking coherent Cardiovascular: S1-S2 no murmur rub or gallop Respiratory: Clinically clear with no evidence of Abdomen soft nontender no rebound no guarding Neurologically intact power 5/5  Discharge Instructions   Discharge Instructions    Diet - low sodium heart healthy   Complete by:  As directed    Discharge instructions   Complete by:  As directed    You were diagnosed with a blood stream infection You will need to continue medications as an outpatient as has been instructed We would recommend that you follow with your dentist in the next week and have your teeth seen I would also recommend labs in 1 week Call your doctor if you have high fevers   Increase activity slowly   Complete by:  As directed      Allergies as of 03/01/2018   No Known Allergies     Medication List    STOP taking these medications   allopurinol 100 MG tablet Commonly known as:  ZYLOPRIM   clindamycin 300 MG capsule Commonly known as:  CLEOCIN   dexamethasone 4 MG tablet Commonly known as:  DECADRON   diclofenac sodium 1 % Gel Commonly known as:  VOLTAREN   famciclovir 250 MG tablet Commonly known as:  FAMVIR   hydrochlorothiazide 12.5 MG capsule Commonly known as:  MICROZIDE     TAKE these medications   amLODipine 10 MG tablet Commonly known as:  NORVASC Take 1 tablet (10 mg total) by mouth daily.  atenolol 100 MG tablet Commonly known as:  TENORMIN Take 1 tablet (100 mg total) by mouth daily.   atorvastatin 10 MG tablet Commonly known as:  LIPITOR TAKE ONE TABLET BY MOUTH ONCE DAILY   cholecalciferol 1000 units tablet Commonly known as:  VITAMIN D Take 1,000 Units by mouth daily.   clopidogrel 75 MG  tablet Commonly known as:  PLAVIX TAKE 1 TABLET BY MOUTH DAILY   levofloxacin 500 MG tablet Commonly known as:  LEVAQUIN Take 1 tablet (500 mg total) by mouth daily for 10 days.   ondansetron 8 MG tablet Commonly known as:  ZOFRAN Take 1 tablet (8 mg total) by mouth 2 (two) times daily as needed for refractory nausea / vomiting. Start on day 2 after bendamustine chemo   oxyCODONE 5 MG immediate release tablet Commonly known as:  Oxy IR/ROXICODONE 1-2 tabs po qid prn pain What changed:    how much to take  how to take this  when to take this  reasons to take this  additional instructions   prochlorperazine 10 MG tablet Commonly known as:  COMPAZINE Take 1 tablet (10 mg total) by mouth every 6 (six) hours as needed (Nausea or vomiting).   vitamin C 500 MG tablet Commonly known as:  ASCORBIC ACID Take 500 mg by mouth daily.   vitamin E 400 UNIT capsule Take 400 Units by mouth daily.      No Known Allergies    The results of significant diagnostics from this hospitalization (including imaging, microbiology, ancillary and laboratory) are listed below for reference.    Significant Diagnostic Studies: Dg Chest 2 View  Result Date: 02/27/2018 CLINICAL DATA:  Dehydration. EXAM: CHEST - 2 VIEW COMPARISON:  February 23, 2018 FINDINGS: No pneumothorax. Mild opacity at the left base is favored represent atelectasis. A small effusions not excluded. No pneumothorax. No change in the cardiomediastinal silhouette. No new infiltrates. IMPRESSION: Atelectasis at the left base. A small associated effusion not excluded. No other acute interval change. Electronically Signed   By: Dorise Bullion III M.D   On: 02/27/2018 00:42   Ct Head Wo Contrast  Result Date: 02/23/2018 CLINICAL DATA:  Facial swelling. Undergoing treatment for diffuse large B-cell lymphoma. EXAM: CT HEAD WITHOUT CONTRAST CT MAXILLOFACIAL WITHOUT CONTRAST TECHNIQUE: Multidetector CT imaging of the head and  maxillofacial structures were performed using the standard protocol without intravenous contrast. Multiplanar CT image reconstructions of the maxillofacial structures were also generated. COMPARISON:  Head CT 11/17/2017 FINDINGS: CT HEAD FINDINGS Brain: There is no evidence for acute hemorrhage, hydrocephalus, mass lesion, or abnormal extra-axial fluid collection. No definite CT evidence for acute infarction. Diffuse loss of parenchymal volume is consistent with atrophy. Patchy low attenuation in the deep hemispheric and periventricular white matter is nonspecific, but likely reflects chronic microvascular ischemic demyelination. Vascular: No hyperdense vessel or unexpected calcification. Skull: The patient's known multiple lytic skull lesions show evidence of interval progression with posterior midline lesion measuring 2.4 cm in long dimension today compared to 2.1 cm previously. Other: None. CT MAXILLOFACIAL FINDINGS Osseous: No fracture or mandibular dislocation. Advanced degenerative changes are noted in the TMJ bilaterally. Chronic type 2 dens fracture as evaluated on cervical spine CT of 10/09/2017. Orbits: Unremarkable. Sinuses: Paranasal sinuses and mastoid air cells are clear. Soft tissues: There is edema and stranding in the lower jaw, right greater than left extending under the chin. This is been incompletely visualized on the maxillofacial CT. Superinfection of the fluid cannot be excluded and assessment is limited  by lack of intravenous contrast material. IMPRESSION: 1. No acute intracranial abnormality. Atrophy with chronic small vessel white matter ischemic disease. 2. Multiple lytic skull lesions as seen previously, slightly progressed in the interval. 3. Chronic type 2 dens fracture as better characterized on cervical spine CT of 10/09/2017. Alignment appears similar today. 4. Edema and stranding in the soft tissues of the lower jaw, right greater than left and extending down under the chin and  posteriorly up medial to the right mandibular angle. These changes have been incompletely visualized on the maxillofacial CT. No definite dental source of these changes although such cannot be completely excluded on this study. CT imaging with intravenous contrast, if possible would likely prove helpful to further assess. Consider performing maxillofacial/neck CT as the abnormal soft tissue attenuation tracks into the anterior neck. Electronically Signed   By: Misty Stanley M.D.   On: 02/23/2018 21:58   Ct Abdomen Pelvis W Contrast  Result Date: 02/23/2018 CLINICAL DATA:  Sepsis EXAM: CT ABDOMEN AND PELVIS WITH CONTRAST TECHNIQUE: Multidetector CT imaging of the abdomen and pelvis was performed using the standard protocol following bolus administration of intravenous contrast. CONTRAST:  40mL ISOVUE-300 IOPAMIDOL (ISOVUE-300) INJECTION 61% COMPARISON:  12/18/2017 head CT FINDINGS: Lower chest: Thoracic aortic atherosclerosis with left main and three-vessel coronary arteriosclerosis. Heart size is normal without pericardial effusion or thickening. No acute pulmonary abnormality. Hepatobiliary: Status post cholecystectomy with intra and extrahepatic ductal dilatation likely on the basis of reservoir effect from prior cholecystectomy. No choledocholithiasis or definite occluding mass. Tiny too small to characterize hypodensities in the left hepatic lobe are noted statistically consistent with cysts or hemangiomata. These measure up to 4 mm. Pancreas: Atrophic without ductal dilatation or mass. No inflammation. Spleen: Normal size spleen without acute abnormality. Adrenals/Urinary Tract: Normal bilateral adrenal glands. Mild renal cortical thinning with bilateral renal cysts some which are too small to characterize. The largest however arises off the interpolar left kidney measuring approximately 3.3 x 7 x 6.7 cm. Stomach/Bowel: Small hiatal hernia. Decompressed stomach with normal duodenal sweep. Normal ligament  of Treitz position. Mild fluid-filled distention of jejunal loops may reflect mild small bowel enteritis. No mechanical bowel obstruction. Moderate stool retention within the colon without obstruction or inflammation. The appendix is not confidently identified but no pericecal inflammation is seen. Vascular/Lymphatic: Moderate aortoiliac atherosclerosis without aneurysm. No lymphadenopathy. Reproductive: Enlarged prostate gland measuring 6 x 6.2 x 7.3 cm. Other: No free air or free fluid. Musculoskeletal: Dextroconvex curvature of the mid lumbar spine with redemonstration of lytic abnormality involving the left sacral ala with destruction noted as before. IMPRESSION: 1. Left main and three-vessel coronary arteriosclerosis. 2. Status post cholecystectomy with intra and extrahepatic ductal dilatation likely on the basis of reservoir effect from prior cholecystectomy. 3. Tiny too small to characterize hypodensities in the left hepatic lobe statistically consistent with cysts or hemangiomata. 4. Bilateral renal cysts some which are too small to characterize. No obstructive uropathy. 5. Mild fluid-filled distention of jejunal loops may reflect a mild small bowel enteritis. No mechanical bowel obstruction. 6. Stable lytic abnormality of the left sacral ala. 7. Prostatomegaly. Electronically Signed   By: Ashley Royalty M.D.   On: 02/23/2018 18:44   Dg Chest Port 1 View  Result Date: 02/23/2018 CLINICAL DATA:  84 y/o M; several days of weakness. History of non-Hodgkin's lymphoma. EXAM: PORTABLE CHEST 1 VIEW COMPARISON:  07/15/2017 chest radiograph. 12/28/2017 PET-CT. FINDINGS: Expansile left anterior fourth rib lesion as seen on prior PET-CT. No consolidation, effusion, or  pneumothorax. Platelike atelectasis at the lung bases and small right basilar calcified granuloma. Stable normal cardiac silhouette. Calcific aortic atherosclerosis. No acute osseous abnormality is evident. IMPRESSION: No acute pulmonary process  identified. Electronically Signed   By: Kristine Garbe M.D.   On: 02/23/2018 16:54   Ct Maxillofacial Wo Contrast  Result Date: 02/23/2018 CLINICAL DATA:  Facial swelling. Undergoing treatment for diffuse large B-cell lymphoma. EXAM: CT HEAD WITHOUT CONTRAST CT MAXILLOFACIAL WITHOUT CONTRAST TECHNIQUE: Multidetector CT imaging of the head and maxillofacial structures were performed using the standard protocol without intravenous contrast. Multiplanar CT image reconstructions of the maxillofacial structures were also generated. COMPARISON:  Head CT 11/17/2017 FINDINGS: CT HEAD FINDINGS Brain: There is no evidence for acute hemorrhage, hydrocephalus, mass lesion, or abnormal extra-axial fluid collection. No definite CT evidence for acute infarction. Diffuse loss of parenchymal volume is consistent with atrophy. Patchy low attenuation in the deep hemispheric and periventricular white matter is nonspecific, but likely reflects chronic microvascular ischemic demyelination. Vascular: No hyperdense vessel or unexpected calcification. Skull: The patient's known multiple lytic skull lesions show evidence of interval progression with posterior midline lesion measuring 2.4 cm in long dimension today compared to 2.1 cm previously. Other: None. CT MAXILLOFACIAL FINDINGS Osseous: No fracture or mandibular dislocation. Advanced degenerative changes are noted in the TMJ bilaterally. Chronic type 2 dens fracture as evaluated on cervical spine CT of 10/09/2017. Orbits: Unremarkable. Sinuses: Paranasal sinuses and mastoid air cells are clear. Soft tissues: There is edema and stranding in the lower jaw, right greater than left extending under the chin. This is been incompletely visualized on the maxillofacial CT. Superinfection of the fluid cannot be excluded and assessment is limited by lack of intravenous contrast material. IMPRESSION: 1. No acute intracranial abnormality. Atrophy with chronic small vessel white matter  ischemic disease. 2. Multiple lytic skull lesions as seen previously, slightly progressed in the interval. 3. Chronic type 2 dens fracture as better characterized on cervical spine CT of 10/09/2017. Alignment appears similar today. 4. Edema and stranding in the soft tissues of the lower jaw, right greater than left and extending down under the chin and posteriorly up medial to the right mandibular angle. These changes have been incompletely visualized on the maxillofacial CT. No definite dental source of these changes although such cannot be completely excluded on this study. CT imaging with intravenous contrast, if possible would likely prove helpful to further assess. Consider performing maxillofacial/neck CT as the abnormal soft tissue attenuation tracks into the anterior neck. Electronically Signed   By: Misty Stanley M.D.   On: 02/23/2018 21:58    Microbiology: Recent Results (from the past 240 hour(s))  Blood Culture (routine x 2)     Status: None (Preliminary result)   Collection Time: 02/23/18  4:50 PM  Result Value Ref Range Status   Specimen Description   Final    BLOOD BLOOD RIGHT HAND Performed at [facility], [address] [place], [place] [address]    Special Requests   Final    BOTTLES DRAWN AEROBIC AND ANAEROBIC Blood Culture adequate volume Performed at [facility], [address] [place], [place] [address]    Culture  Setup Time   Final    ANAEROBIC BOTTLE ONLY GRAM POSITIVE COCCI CRITICAL VALUE NOTED.  VALUE IS CONSISTENT WITH PREVIOUSLY REPORTED AND CALLED VALUE.    Culture   Final    GRAM POSITIVE COCCI CULTURE REINCUBATED FOR BETTER GROWTH Performed at [facility], [address] [place], [place] [address]    Report Status PENDING  Incomplete  Blood Culture (routine x 2)     Status: Abnormal   Collection Time: 02/23/18  5:25 PM  Result Value Ref Range Status   Specimen Description   Final    BLOOD BLOOD LEFT ARM Performed at [facility], [address] [place], [place] [address]    Special Requests   Final    BOTTLES DRAWN AEROBIC AND ANAEROBIC Blood Culture adequate volume Performed at [facility], [address] [place], [place] [address]    Culture  Setup Time   Final    ANAEROBIC BOTTLE ONLY GRAM POSITIVE COCCI CRITICAL RESULT CALLED TO, READ BACK BY AND VERIFIED WITH: Rosemarie Ax RN, AT [phone] 02/26/18 BY Rush Landmark Performed at [facility], [place] [address] [place], [place] [address]    Culture STREPTOCOCCUS INTERMEDIUS (A)  Final   Report Status 03/01/2018 FINAL  Final   Organism ID, Bacteria STREPTOCOCCUS INTERMEDIUS  Final      Susceptibility   Streptococcus intermedius - MIC*    PENICILLIN <=0.06 SENSITIVE Sensitive     CEFTRIAXONE 0.25 SENSITIVE Sensitive     ERYTHROMYCIN <=0.12 SENSITIVE Sensitive     LEVOFLOXACIN 0.5 SENSITIVE Sensitive     VANCOMYCIN 0.5 SENSITIVE Sensitive     MOXIFLOXACIN 0.12 SENSITIVE Sensitive     TIGECYCLINE <=0.06 SENSITIVE Sensitive     * STREPTOCOCCUS INTERMEDIUS  Blood Culture ID Panel (Reflexed)     Status: Abnormal   Collection Time: 02/23/18  5:25 PM  Result Value Ref Range Status   Enterococcus species NOT DETECTED NOT DETECTED Final   Listeria monocytogenes NOT DETECTED NOT DETECTED Final   Staphylococcus species NOT DETECTED NOT DETECTED Final   Staphylococcus aureus (BCID) NOT DETECTED NOT DETECTED Final   Streptococcus species DETECTED (A) NOT DETECTED Final    Comment: Not Enterococcus species, Streptococcus agalactiae, Streptococcus pyogenes, or Streptococcus pneumoniae. CRITICAL RESULT CALLED TO, READ BACK BY AND VERIFIED WITH: Rosemarie Ax RN, AT [phone] 02/26/18 BY D. VANHOOK    Streptococcus agalactiae NOT DETECTED NOT DETECTED Final   Streptococcus pneumoniae NOT DETECTED NOT DETECTED Final   Streptococcus pyogenes NOT DETECTED NOT DETECTED Final   Acinetobacter baumannii NOT DETECTED NOT DETECTED Final   Enterobacteriaceae  species NOT DETECTED NOT DETECTED Final   Enterobacter cloacae complex NOT DETECTED NOT DETECTED Final   Escherichia coli NOT DETECTED NOT DETECTED Final   Klebsiella oxytoca NOT DETECTED NOT DETECTED Final   Klebsiella pneumoniae NOT DETECTED NOT DETECTED Final   Proteus species NOT DETECTED NOT DETECTED Final   Serratia marcescens NOT DETECTED NOT DETECTED Final   Haemophilus influenzae NOT DETECTED NOT DETECTED Final   Neisseria meningitidis NOT DETECTED NOT DETECTED Final   Pseudomonas aeruginosa NOT DETECTED NOT DETECTED Final   Candida albicans NOT DETECTED NOT DETECTED Final   Candida glabrata NOT DETECTED NOT DETECTED Final   Candida krusei NOT DETECTED NOT DETECTED Final   Candida parapsilosis NOT DETECTED NOT DETECTED Final   Candida tropicalis NOT DETECTED NOT DETECTED Final    Comment: Performed at [facility], [place]. [address] [place], [place] [address]  Culture, blood (Routine x 2)     Status: None (Preliminary result)   Collection Time: 02/26/18 10:40 PM  Result Value Ref Range Status   Specimen Description BLOOD LEFT ARM  Final   Special Requests   Final    BOTTLES DRAWN AEROBIC AND  ANAEROBIC Blood Culture results may not be optimal due to an inadequate volume of blood received in culture bottles   Culture   Final    NO GROWTH 3 DAYS Performed at [facility], [place] [address] [place], [place] [address]    Report Status PENDING  Incomplete  Culture, blood (Routine x 2)     Status: None (Preliminary result)   Collection Time: 02/27/18  2:19 AM  Result Value Ref Range Status   Specimen Description BLOOD LEFT ANTECUBITAL  Final   Special Requests   Final    BOTTLES DRAWN AEROBIC AND ANAEROBIC Blood Culture adequate volume   Culture   Final    NO GROWTH 2 DAYS Performed at [facility], [place] [address], [place],  [address]    Report Status PENDING  Incomplete     Labs: Basic Metabolic Panel: Recent Labs  Lab 02/23/18 1618 02/26/18 2247  02/27/18 0404 03/01/18 0257  NA 134* 132* 134* 134*  K 4.1 4.7 4.4 4.0  CL 96* 99 101 105  CO2 25 20* 21* 21*  GLUCOSE 109* 115* 109* 74  BUN 27* 66* 64* 20  CREATININE 1.27* 2.13* 1.82* 1.13  CALCIUM 8.7* 8.4* 8.2* 7.6*  MG  --   --  2.2  --    Liver Function Tests: Recent Labs  Lab 02/23/18 1618 02/26/18 2247  AST 48* 21  ALT 68* 33  ALKPHOS 63 55  BILITOT 1.4* 0.9  PROT 6.5 6.1*  ALBUMIN 3.2* 2.9*   Recent Labs  Lab 02/23/18 1618  LIPASE 30   No results for input(s): AMMONIA in the last 168 hours. CBC: Recent Labs  Lab 02/23/18 1618 02/26/18 2247 02/27/18 0404 03/01/18 0257  WBC 20.3* 13.7* 13.5* 9.9  NEUTROABS 17.7* 12.7*  --  7.8*  HGB 13.2 11.7* 11.2* 10.7*  HCT 40.8 35.3* 34.8* 33.1*  MCV 90.5 89.8 88.8 89.0  PLT 142* 31* 161 162   Cardiac Enzymes: Recent Labs  Lab 02/23/18 1618 02/27/18 0404 02/27/18 0816 02/27/18 1450  TROPONINI <0.03 <0.03 <0.03 <0.03   BNP: BNP (last 3 results) No results for input(s): BNP in the last 8760 hours.  ProBNP (last 3 results) No results for input(s): PROBNP in the last 8760 hours.  CBG: No results for input(s): GLUCAP in the last 168 hours.     Signed:  Nita Sells MD   [facility] 03/01/2018, 1:07 PM

## 2018-03-02 ENCOUNTER — Other Ambulatory Visit: Payer: Medicare Other

## 2018-03-02 ENCOUNTER — Ambulatory Visit: Payer: Medicare Other | Admitting: Hematology & Oncology

## 2018-03-02 ENCOUNTER — Telehealth: Payer: Self-pay

## 2018-03-02 ENCOUNTER — Telehealth: Payer: Self-pay | Admitting: Emergency Medicine

## 2018-03-02 LAB — CULTURE, BLOOD (ROUTINE X 2): SPECIAL REQUESTS: ADEQUATE

## 2018-03-02 NOTE — Telephone Encounter (Signed)
Post ED Visit - Positive Culture Follow-up  Culture report reviewed by antimicrobial stewardship pharmacist:  []  Elenor Quinones, Pharm.D. []  Heide Guile, Pharm.D., BCPS AQ-ID []  Parks Neptune, Pharm.D., BCPS []  Alycia Rossetti, Pharm.D., BCPS []  [place], Pharm.D., BCPS, AAHIVP []  Legrand Como, Pharm.D., BCPS, AAHIVP [x]  Salome Arnt, PharmD, BCPS []  Johnnette Gourd, PharmD, BCPS []  Hughes Better, PharmD, BCPS []  Leeroy Cha, PharmD  Positive blood culture Treated with clindamycin, also admitted as inpatient and repeat cultures were negative, no further patient follow-up is required at this time.  Hazle Nordmann 03/02/2018, 11:46 AM

## 2018-03-02 NOTE — Telephone Encounter (Signed)
Spoke with pts wife, Brendan Gonzalez.   Transition Care Management Follow-up Telephone Call  Admit date: 02/26/2018 Discharge date: 03/01/2018 Principal Problem:  Bacteremia   How have you been since you were released from the hospital? "He's doing okay"   Do you understand why you were in the hospital? yes, "infection in his blood"   Do you understand the discharge instructions? yes   Where were you discharged to? Home. Lives in senior community with wife.    Items Reviewed:  Medications reviewed: no, Advised to bring meds to appt  Allergies reviewed: yes  Dietary changes reviewed: yes  Referrals reviewed: yes. Wife states [facility] nurse to call today for assessment appointment.    Functional Questionnaire:   Activities of Daily Living (ADLs):   He states they are independent in the following: ambulation, bathing and hygiene, feeding, continence, grooming, toileting and dressing. Ambulates with walker.  States they require assistance with the following: None.    Any transportation issues/concerns?: no   Any patient concerns? No.    Confirmed importance and date/time of follow-up visits scheduled yes  Provider Appointment booked with PCP on Wednesday, 03/07/18 @ 11am.   Confirmed with patient if condition begins to worsen call PCP or go to the ER.  Patient was given the office number and encouraged to call back with question or concerns.  : yes

## 2018-03-02 NOTE — Telephone Encounter (Signed)
Noted  

## 2018-03-02 NOTE — Telephone Encounter (Signed)
Spoke with wife, Florian Buff, to complete TCM and schedule hospital f/u. Phone disconnected, unable to reach wife at this time. Will continue to attempt.

## 2018-03-03 ENCOUNTER — Telehealth: Payer: Self-pay

## 2018-03-03 LAB — CULTURE, BLOOD (ROUTINE X 2): Culture: NO GROWTH

## 2018-03-03 NOTE — Telephone Encounter (Signed)
+  BC  Already on Abc per A Master Pharm D

## 2018-03-04 LAB — CULTURE, BLOOD (ROUTINE X 2)
CULTURE: NO GROWTH
Special Requests: ADEQUATE

## 2018-03-05 ENCOUNTER — Telehealth: Payer: Self-pay | Admitting: Family Medicine

## 2018-03-05 NOTE — Telephone Encounter (Signed)
Copied from [place] [id number]. Topic: Quick Communication - See Telephone Encounter >> Mar 05, 2018  3:43 PM R[place] Nail, [place] wrote: CRM for notification. See Telephone encounter for: 03/05/18. Santiago Glad with [facility] calling and states that they were to admit to service yesterday (03/04/18) and was almost finished with the process and then the patient and his wife decided he wanted to go to rehab of their community, [facility].  CB#: [phone]

## 2018-03-05 NOTE — Telephone Encounter (Signed)
Brendan Gonzalez to see if we needed to do anything else, they said no.   I called pt to see if they needed an order for rehab or if they were able to get in on their own. Pts wife asked me to call Brendan Gonzalez [phone], coordinator at [facility]).  I called Brendan Gonzalez and she stated that the hospital did not order rehab for pt just [facility]. She stated that she tried to explain to them that they would have to pay out of pocket for the rehab at their center since no order for rehab was placed. She stated that she will speak to them again about trying in home therapy. I advised her that pt has a f/u apt with Brendan Gonzalez on Wednesday and if we feel pt would benefit more from rehab at their center we can put in an order. She voiced understanding and will still try to encourage pt to do in home therapy.

## 2018-03-06 ENCOUNTER — Encounter (HOSPITAL_COMMUNITY)
Admission: RE | Admit: 2018-03-06 | Discharge: 2018-03-06 | Disposition: A | Discharge status: Home / Self Care | Payer: Medicare Other | Source: Ambulatory Visit | Attending: Hematology & Oncology | Admitting: Hematology & Oncology

## 2018-03-06 DIAGNOSIS — C8580 Other specified types of non-Hodgkin lymphoma, unspecified site: Secondary | ICD-10-CM | POA: Diagnosis not present

## 2018-03-06 DIAGNOSIS — C8330 Diffuse large B-cell lymphoma, unspecified site: Secondary | ICD-10-CM

## 2018-03-06 LAB — GLUCOSE, CAPILLARY: GLUCOSE-CAPILLARY: 80 mg/dL (ref 70–99)

## 2018-03-06 MED ORDER — FLUDEOXYGLUCOSE F - 18 (FDG) INJECTION
7.2700 | Freq: Once | INTRAVENOUS | Status: AC | PRN
  Administered 2018-03-06: 7.27 via INTRAVENOUS

## 2018-03-07 ENCOUNTER — Ambulatory Visit: Payer: Medicare Other | Admitting: Family Medicine

## 2018-03-07 ENCOUNTER — Encounter: Payer: Self-pay | Admitting: Family Medicine

## 2018-03-07 ENCOUNTER — Telehealth: Payer: Self-pay | Admitting: *Deleted

## 2018-03-07 VITALS — BP 111/75 | HR 73 | Temp 97.4°F | Wt 147.0 lb

## 2018-03-07 DIAGNOSIS — N183 Chronic kidney disease, stage 3 unspecified: Secondary | ICD-10-CM

## 2018-03-07 DIAGNOSIS — D638 Anemia in other chronic diseases classified elsewhere: Secondary | ICD-10-CM

## 2018-03-07 DIAGNOSIS — I10 Essential (primary) hypertension: Secondary | ICD-10-CM

## 2018-03-07 DIAGNOSIS — C8330 Diffuse large B-cell lymphoma, unspecified site: Secondary | ICD-10-CM

## 2018-03-07 DIAGNOSIS — R7881 Bacteremia: Secondary | ICD-10-CM | POA: Diagnosis not present

## 2018-03-07 DIAGNOSIS — N179 Acute kidney failure, unspecified: Secondary | ICD-10-CM

## 2018-03-07 LAB — CBC WITH DIFFERENTIAL/PLATELET
BASOS ABS: 0 10*3/uL (ref 0.0–0.1)
BASOS PCT: 0.6 % (ref 0.0–3.0)
EOS ABS: 0.1 10*3/uL (ref 0.0–0.7)
Eosinophils Relative: 1.7 % (ref 0.0–5.0)
HEMATOCRIT: 32.6 % — AB (ref 39.0–52.0)
HEMOGLOBIN: 10.9 g/dL — AB (ref 13.0–17.0)
LYMPHS PCT: 4 % — AB (ref 12.0–46.0)
Lymphs Abs: 0.4 10*3/uL — ABNORMAL LOW (ref 0.7–4.0)
MCHC: 33.5 g/dL (ref 30.0–36.0)
MCV: 90.5 fl (ref 78.0–100.0)
Monocytes Absolute: 1.1 10*3/uL — ABNORMAL HIGH (ref 0.1–1.0)
Monocytes Relative: 12.2 % — ABNORMAL HIGH (ref 3.0–12.0)
Neutro Abs: 7.2 10*3/uL (ref 1.4–7.7)
Neutrophils Relative %: 81.5 % — ABNORMAL HIGH (ref 43.0–77.0)
Platelets: 243 10*3/uL (ref 150.0–400.0)
RBC: 3.61 Mil/uL — ABNORMAL LOW (ref 4.22–5.81)
RDW: 15.8 % — ABNORMAL HIGH (ref 11.5–15.5)
WBC: 8.9 10*3/uL (ref 4.0–10.5)

## 2018-03-07 LAB — BASIC METABOLIC PANEL
BUN: 20 mg/dL (ref 6–23)
CO2: 23 mEq/L (ref 19–32)
CREATININE: 1.28 mg/dL (ref 0.40–1.50)
Calcium: 8.3 mg/dL — ABNORMAL LOW (ref 8.4–10.5)
Chloride: 101 mEq/L (ref 96–112)
GFR: 55.31 mL/min — AB (ref >60.00)
GLUCOSE: 131 mg/dL — AB (ref 70–99)
POTASSIUM: 4.3 meq/L (ref 3.5–5.1)
Sodium: 133 mEq/L — ABNORMAL LOW (ref 135–145)

## 2018-03-07 NOTE — Telephone Encounter (Signed)
-----   Message from Volanda Napoleon, MD sent at 03/07/2018  6:28 AM EST ----- Call his son - the lymphoma is shrinking!!! PET scan shows a very nice response to therapy!!!  pete

## 2018-03-07 NOTE — Telephone Encounter (Signed)
As noted below by Dr. Marin Olp, I informed the patient that his lymphoma is shrinking. The PET scan shows a nice response to therapy. He verbalized understanding.

## 2018-03-07 NOTE — Progress Notes (Addendum)
03/10/2018  CC:  Chief Complaint  Patient presents with  . Hospitalization Follow-up    Patient is a 84 y.o. Caucasian male who presents accompanied by his son for  hospital follow up, specifically [facility] face-to-face visit. Dates hospitalized:  11/18-11/21, 2019. Days since d/c from hospital: 6 days. Patient was discharged from hospital to home. Reason for admission to hospital:  Fever, leukocytosis, bacteremia, question dental source. Date of interactive (phone) contact with patient and/or caregiver: 03/02/18.  I have reviewed patient's discharge summary plus pertinent specific notes, labs, and imaging from the hospitalization.   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 Admitted for reason noted above (fever/bacteremia). He was treated with IV abx and d/c'd home on levaquin.  Cultures grew strep intermedius.  R mandibular wisdom tooth was hurting and appears eroded/decayed. It no longer hurts now.  He has appt with an oral surgeon 03/14/18.  He is improving, feeling tired still, has PT set up with HH to be done three times per week at home. No fevers, n/v/d, cough, ST, rash, joint swelling or redness, or tooth/jaw pain.  No facial swelling. No abd pain.  No signif bony pain, specifically pelvic/back/hip pains.   Medication reconciliation was done today and patient is taking meds as recommended by discharging hospitalist/specialist.    PMH:  Past Medical History:  Diagnosis Date  . C2 cervical fracture ([place]) 07/2017   Type II morphology with posterior displacement--C collar (potentially indefinitely).  . Chronic lower back pain   . Chronic renal insufficiency, stage III (moderate) (HCC)    GFR 50s-60s  . Diffuse large cell non-Hodgkin's lymphoma ([place]) 12/15/2017   B cell NHL involving bones only as of 01/2018.  RT 12/2017 helped  with his pain; pt is to get chemo 01/2018.  . Goals of care, counseling/discussion 12/15/2017  . History of colon cancer 1993  . History of CVA (cerebrovascular accident)    Left MCA territory.  Carotid dopplers ok, Brain MRA ok, ECHO with grd I DD.  As of 02/2018->no ASA or plavix or statin due to pt getting chemo and RT + the related alteration in goals of care.  . Hypertension   . Lumbar spinal stenosis    Surgery 2012 helped but in 2015 pain has returned  . Lytic bone lesions on xray 08/2017   Skull, mildly abnormal bone scan. SPEP/UPEP w/out monoclonal spike.  Dr. Marin Olp eval 08/2017: plan CT C/A/P but not done as of 11/22/17.  ED visit s/p fall 11/17/17-->multiple lytic lesions now, L sacrum destroyed by tumor, lytic lesion in pelvis. Dr. Marin Olp has arranged for bx, also plan for RT and XGeva.  . 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  RT 12/2017, set to get chemo 01/2018.  . Osteoarthritis   . Venous insufficiency of both lower extremities    made worse by amlodipine    PSH:  Past Surgical History:  Procedure Laterality Date  . CHOLECYSTECTOMY  1984  . INGUINAL HERNIA REPAIR  1974   Bilat  . LUMBAR SPINE SURGERY  2012   for spinal stenosis (Dr. Jonelle Sports in [place])  . LUNG LOBECTOMY  1993   LLL per pt--worry of possible cancer but turned out to be benign.  [name] PARTIAL COLECTOMY  1993   for colon cancer    MEDS:  Outpatient Medications Prior to  Visit  Medication Sig Dispense Refill  . amLODipine (NORVASC) 10 MG tablet Take 1 tablet (10 mg total) by mouth daily. 90 tablet 1  . atenolol (TENORMIN) 100 MG tablet Take 1 tablet (100 mg total) by mouth daily. 90 tablet 1  . cholecalciferol (VITAMIN D) 1000 UNITS tablet Take 1,000 Units by mouth daily.    [name] levofloxacin (LEVAQUIN) 500 MG tablet Take 1 tablet (500 mg total) by mouth daily for 10 days. 10 tablet 0  . vitamin E 400 UNIT capsule Take 400 Units by mouth daily.     [name] atorvastatin  (LIPITOR) 10 MG tablet TAKE ONE TABLET BY MOUTH ONCE DAILY (Patient not taking: Reported on 02/26/2018) 90 tablet 0  . clopidogrel (PLAVIX) 75 MG tablet TAKE 1 TABLET BY MOUTH DAILY (Patient not taking: Reported on 12/05/2017) 90 tablet 1  . ondansetron (ZOFRAN) 8 MG tablet Take 1 tablet (8 mg total) by mouth 2 (two) times daily as needed for refractory nausea / vomiting. Start on day 2 after bendamustine chemo (Patient not taking: Reported on 02/26/2018) 30 tablet 1  . oxyCODONE (OXY IR/ROXICODONE) 5 MG immediate release tablet 1-2 tabs po qid prn pain (Patient not taking: Reported on 03/07/2018) 84 tablet 0  . prochlorperazine (COMPAZINE) 10 MG tablet Take 1 tablet (10 mg total) by mouth every 6 (six) hours as needed (Nausea or vomiting). (Patient not taking: Reported on 02/26/2018) 30 tablet 1  . vitamin C (ASCORBIC ACID) 500 MG tablet Take 500 mg by mouth daily.     No facility-administered medications prior to visit.    EXAM: BP 111/75 (BP Location: Left Arm, Patient Position: Sitting, Cuff Size: Normal)   Pulse 73   Temp (!) 97.4 F (36.3 C) (Temporal)   Wt 147 lb (66.7 kg)   SpO2 100%   BMI 23.02 kg/m  Gen: Alert, well appearing.  Patient is oriented to person, place, time, and situation. AFFECT: pleasant, lucid thought and speech. DVV:OHYW: no injection, icteris, swelling, or exudate.  EOMI, PERRLA. Mouth: lips without lesion/swelling.  Oral mucosa pink and moist. Oropharynx without erythema, exudate, or swelling.  R mandibular wisdom tooth dull yellow and eroded.  No surrounding erythema or swelling. CV: RRR, no m/r/g.   LUNGS: CTA bilat, nonlabored resps, good aeration in all lung fields. ABD: soft, NT/ND EXT: no clubbing or cyanosis.  no edema.  Skin - no sores or suspicious lesions or rashes or color changes   Pertinent labs/imaging   Chemistry      Component Value Date/Time   NA 133 (L) 03/07/2018 1145   K 4.3 03/07/2018 1145   CL 101 03/07/2018 1145   CO2 23  03/07/2018 1145   BUN 20 03/07/2018 1145   CREATININE 1.28 03/07/2018 1145   CREATININE 1.10 02/12/2018 0904   CREATININE 1.22 (H) 08/03/2017 1518      Component Value Date/Time   CALCIUM 8.3 (L) 03/07/2018 1145   ALKPHOS 55 02/26/2018 2247   AST 21 02/26/2018 2247   AST 30 02/12/2018 0904   ALT 33 02/26/2018 2247   ALT 20 02/12/2018 0904   BILITOT 0.9 02/26/2018 2247   BILITOT 0.7 02/12/2018 0904     Lab Results  Component Value Date   WBC 8.9 03/07/2018   HGB 10.9 (L) 03/07/2018   HCT 32.6 (L) 03/07/2018   MCV 90.5 03/07/2018   PLT 243.0 03/07/2018     ASSESSMENT/PLAN:  1) Bacteremia, suspected dental source. Clinically resolved.  Finish full rx'd course of levaquin. CBC and BMET  today. Keep plan of oral surgery evaluation set for 03/14/18.  2) Diffuse B cell lymphoma: s/p chemo + RF to left sacral destructive lesion.  Pt relatively pain free at this time.  PET scan to assess response to tx/look for any new areas of dz was just completed and results are pending.  Ongoing f/u with med onc and Rad Onc.  3) Anemia of chronic dz: stable.  Monitor cBC today.  4) CRI III: avoid NSAIDs.  Emphasized adequate hydration. Lytes/cr today.  5) HTN: The current medical regimen is effective;  continue present plan and medications (amlodipine and atenolol)..  Medical decision making of moderate complexity was utilized today.  FOLLOW UP:  2 months  An After Visit Summary was printed and given to the patient.  Signed:  Crissie Sickles, MD           03/10/2018   ADDENDUM 02/28/18: NM PET 03/06/18: IMPRESSION: 1. Response to therapy of osseous lymphoma. 2. No new or progressive disease. No evidence of soft tissue lymphoma. 3. Incidental findings, including prostatomegaly with probable bladder outlet obstruction, aortic and coronary artery Atherosclerosis  Signed:  Crissie Sickles, MD           03/10/2018

## 2018-03-10 ENCOUNTER — Encounter: Payer: Self-pay | Admitting: Family Medicine

## 2018-03-10 DIAGNOSIS — C8339 Diffuse large B-cell lymphoma, extranodal and solid organ sites: Secondary | ICD-10-CM | POA: Diagnosis not present

## 2018-03-10 DIAGNOSIS — M48061 Spinal stenosis, lumbar region without neurogenic claudication: Secondary | ICD-10-CM | POA: Diagnosis not present

## 2018-03-10 DIAGNOSIS — I872 Venous insufficiency (chronic) (peripheral): Secondary | ICD-10-CM | POA: Diagnosis not present

## 2018-03-10 DIAGNOSIS — N183 Chronic kidney disease, stage 3 (moderate): Secondary | ICD-10-CM | POA: Diagnosis not present

## 2018-03-10 DIAGNOSIS — M1990 Unspecified osteoarthritis, unspecified site: Secondary | ICD-10-CM | POA: Diagnosis not present

## 2018-03-10 DIAGNOSIS — R7881 Bacteremia: Secondary | ICD-10-CM | POA: Diagnosis not present

## 2018-03-10 DIAGNOSIS — Z79899 Other long term (current) drug therapy: Secondary | ICD-10-CM | POA: Diagnosis not present

## 2018-03-10 DIAGNOSIS — I129 Hypertensive chronic kidney disease with stage 1 through stage 4 chronic kidney disease, or unspecified chronic kidney disease: Secondary | ICD-10-CM | POA: Diagnosis not present

## 2018-03-10 DIAGNOSIS — B954 Other streptococcus as the cause of diseases classified elsewhere: Secondary | ICD-10-CM | POA: Diagnosis not present

## 2018-03-12 ENCOUNTER — Inpatient Hospital Stay: Payer: Medicare Other

## 2018-03-12 ENCOUNTER — Other Ambulatory Visit: Payer: Self-pay

## 2018-03-12 ENCOUNTER — Encounter: Payer: Self-pay | Admitting: Hematology & Oncology

## 2018-03-12 ENCOUNTER — Encounter: Payer: Self-pay | Admitting: *Deleted

## 2018-03-12 ENCOUNTER — Inpatient Hospital Stay: Payer: Medicare Other | Attending: Hematology & Oncology | Admitting: Hematology & Oncology

## 2018-03-12 VITALS — BP 88/60 | HR 74 | Temp 98.0°F | Resp 16 | Wt 146.0 lb

## 2018-03-12 DIAGNOSIS — C7951 Secondary malignant neoplasm of bone: Secondary | ICD-10-CM | POA: Insufficient documentation

## 2018-03-12 DIAGNOSIS — C8335 Diffuse large B-cell lymphoma, lymph nodes of inguinal region and lower limb: Secondary | ICD-10-CM | POA: Diagnosis not present

## 2018-03-12 DIAGNOSIS — Z5112 Encounter for antineoplastic immunotherapy: Secondary | ICD-10-CM | POA: Diagnosis not present

## 2018-03-12 DIAGNOSIS — C8330 Diffuse large B-cell lymphoma, unspecified site: Secondary | ICD-10-CM

## 2018-03-12 DIAGNOSIS — Z5111 Encounter for antineoplastic chemotherapy: Secondary | ICD-10-CM | POA: Diagnosis not present

## 2018-03-12 DIAGNOSIS — C8580 Other specified types of non-Hodgkin lymphoma, unspecified site: Principal | ICD-10-CM

## 2018-03-12 DIAGNOSIS — M549 Dorsalgia, unspecified: Secondary | ICD-10-CM | POA: Diagnosis not present

## 2018-03-12 DIAGNOSIS — Z923 Personal history of irradiation: Secondary | ICD-10-CM | POA: Insufficient documentation

## 2018-03-12 DIAGNOSIS — R531 Weakness: Secondary | ICD-10-CM | POA: Diagnosis not present

## 2018-03-12 DIAGNOSIS — Z79899 Other long term (current) drug therapy: Secondary | ICD-10-CM | POA: Diagnosis not present

## 2018-03-12 DIAGNOSIS — M791 Myalgia, unspecified site: Secondary | ICD-10-CM

## 2018-03-12 LAB — CBC WITH DIFFERENTIAL (CANCER CENTER ONLY)
Abs Immature Granulocytes: 0.09 10*3/uL — ABNORMAL HIGH (ref 0.00–0.07)
Basophils Absolute: 0.1 10*3/uL (ref 0.0–0.1)
Basophils Relative: 1 %
EOS ABS: 0.2 10*3/uL (ref 0.0–0.5)
EOS PCT: 2 %
HEMATOCRIT: 33 % — AB (ref 39.0–52.0)
Hemoglobin: 10.5 g/dL — ABNORMAL LOW (ref 13.0–17.0)
Immature Granulocytes: 1 %
LYMPHS ABS: 0.3 10*3/uL — AB (ref 0.7–4.0)
LYMPHS PCT: 5 %
MCH: 29.5 pg (ref 26.0–34.0)
MCHC: 31.8 g/dL (ref 30.0–36.0)
MCV: 92.7 fL (ref 80.0–100.0)
MONOS PCT: 13 %
Monocytes Absolute: 0.9 10*3/uL (ref 0.1–1.0)
Neutro Abs: 5.3 10*3/uL (ref 1.7–7.7)
Neutrophils Relative %: 78 %
Platelet Count: 179 10*3/uL (ref 150–400)
RBC: 3.56 MIL/uL — ABNORMAL LOW (ref 4.22–5.81)
RDW: 15.7 % — AB (ref 11.5–15.5)
WBC Count: 6.8 10*3/uL (ref 4.0–10.5)
nRBC: 0 % (ref 0.0–0.2)

## 2018-03-12 LAB — CMP (CANCER CENTER ONLY)
ALBUMIN: 3.2 g/dL — AB (ref 3.5–5.0)
ALT: 12 U/L (ref 0–44)
ANION GAP: 10 (ref 5–15)
AST: 14 U/L — ABNORMAL LOW (ref 15–41)
Alkaline Phosphatase: 48 U/L (ref 38–126)
BILIRUBIN TOTAL: 0.5 mg/dL (ref 0.3–1.2)
BUN: 15 mg/dL (ref 8–23)
CO2: 25 mmol/L (ref 22–32)
Calcium: 8.2 mg/dL — ABNORMAL LOW (ref 8.9–10.3)
Chloride: 101 mmol/L (ref 98–111)
Creatinine: 1.23 mg/dL (ref 0.61–1.24)
GFR, EST AFRICAN AMERICAN: 57 mL/min — AB (ref >60)
GFR, EST NON AFRICAN AMERICAN: 49 mL/min — AB (ref >60)
Glucose, Bld: 122 mg/dL — ABNORMAL HIGH (ref 70–99)
POTASSIUM: 3.9 mmol/L (ref 3.5–5.1)
Sodium: 136 mmol/L (ref 135–145)
TOTAL PROTEIN: 5.4 g/dL — AB (ref 6.5–8.1)

## 2018-03-12 LAB — LACTATE DEHYDROGENASE: LDH: 173 U/L (ref 98–192)

## 2018-03-12 NOTE — Progress Notes (Signed)
Hematology and Oncology Follow Up Visit  Brendan Gonzalez [id number] 10/03/1919 83 y.o. 03/12/2018   Principle Diagnosis:   Diffuse large B-cell NHL of the bone  Current Therapy:   XRT to the LEFT sacrum Rituxan/Bendamustine -- s/p cycle #2  Xgeva 120 mg sq q 3 months -- next dose 03/2018     Interim History:  Brendan Gonzalez is back for follow-up.  As expected, he is responded very nicely to chemotherapy.  We had his PET scan done last week.  The PET scan showed that the vast majority of his lymphoma was gone.  Unfortunately, he had to be hospitalized.  He was hospitalized for 4 days.  He had Streptococcus bacteremia.  I am unsure of this was classify him as pneumonia.  Thankfully he was not leukopenic.  He did not have a good hospital stay according to his son.  He is still incredibly weak.  He needs more physical therapy.  He really is not able to take chemotherapy right now.  His appetite is not that good.  He needs to improve his appetite.  Again he is not hurting.  He is not having any problems with nausea or vomiting.  He has had no cough.  There is been no mouth sores.  Overall, his performance status is ECOG 2 at best.   Medications:  Current Outpatient Medications:  .  amLODipine (NORVASC) 10 MG tablet, Take 1 tablet (10 mg total) by mouth daily., Disp: 90 tablet, Rfl: 1 .  atenolol (TENORMIN) 100 MG tablet, Take 1 tablet (100 mg total) by mouth daily., Disp: 90 tablet, Rfl: 1 .  atorvastatin (LIPITOR) 10 MG tablet, TAKE ONE TABLET BY MOUTH ONCE DAILY (Patient not taking: Reported on 02/26/2018), Disp: 90 tablet, Rfl: 0 .  cholecalciferol (VITAMIN D) 1000 UNITS tablet, Take 1,000 Units by mouth daily., Disp: , Rfl:  .  clopidogrel (PLAVIX) 75 MG tablet, TAKE 1 TABLET BY MOUTH DAILY (Patient not taking: Reported on 12/05/2017), Disp: 90 tablet, Rfl: 1 .  ondansetron (ZOFRAN) 8 MG tablet, Take 1 tablet (8 mg total) by mouth 2 (two) times daily as needed for refractory nausea /  vomiting. Start on day 2 after bendamustine chemo (Patient not taking: Reported on 02/26/2018), Disp: 30 tablet, Rfl: 1 .  oxyCODONE (OXY IR/ROXICODONE) 5 MG immediate release tablet, 1-2 tabs po qid prn pain (Patient not taking: Reported on 03/07/2018), Disp: 84 tablet, Rfl: 0 .  prochlorperazine (COMPAZINE) 10 MG tablet, Take 1 tablet (10 mg total) by mouth every 6 (six) hours as needed (Nausea or vomiting). (Patient not taking: Reported on 02/26/2018), Disp: 30 tablet, Rfl: 1 .  vitamin C (ASCORBIC ACID) 500 MG tablet, Take 500 mg by mouth daily., Disp: , Rfl:  .  vitamin E 400 UNIT capsule, Take 400 Units by mouth daily. , Disp: , Rfl:   Allergies: No Known Allergies  Past Medical History, Surgical history, Social history, and Family History were reviewed and updated.  Review of Systems: Review of Systems  Constitutional: Negative.   HENT:  Negative.   Eyes: Negative.   Respiratory: Negative.   Cardiovascular: Negative.   Gastrointestinal: Negative.   Endocrine: Negative.   Genitourinary: Negative.    Musculoskeletal: Positive for back pain and myalgias.  Skin: Negative.   Neurological: Negative.   Hematological: Negative.   Psychiatric/Behavioral: Negative.     Physical Exam:  weight is 146 lb (66.2 kg). His oral temperature is 98 F (36.7 C). His blood pressure is 88/60 (abnormal) and  his pulse is 74. His respiration is 16 and oxygen saturation is 92%.   Wt Readings from Last 3 Encounters:  03/12/18 146 lb (66.2 kg)  03/07/18 147 lb (66.7 kg)  02/27/18 145 lb 11.6 oz (66.1 kg)    Physical Exam  Constitutional: He is oriented to person, place, and time.  HENT:  Head: Normocephalic and atraumatic.  Mouth/Throat: Oropharynx is clear and moist.  Eyes: Pupils are equal, round, and reactive to light. EOM are normal.  Neck: Normal range of motion.  Cardiovascular: Normal rate, regular rhythm and normal heart sounds.  Pulmonary/Chest: Effort normal and breath sounds normal.   Abdominal: Soft. Bowel sounds are normal.  Musculoskeletal: Normal range of motion. He exhibits no edema, tenderness or deformity.  Lymphadenopathy:    He has no cervical adenopathy.  Neurological: He is alert and oriented to person, place, and time.  Skin: Skin is warm and dry. No rash noted. No erythema.  Psychiatric: He has a normal mood and affect. His behavior is normal. Judgment and thought content normal.  Vitals reviewed.    Lab Results  Component Value Date   WBC 6.8 03/12/2018   HGB 10.5 (L) 03/12/2018   HCT 33.0 (L) 03/12/2018   MCV 92.7 03/12/2018   PLT 179 03/12/2018     Chemistry      Component Value Date/Time   NA 136 03/12/2018 0844   K 3.9 03/12/2018 0844   CL 101 03/12/2018 0844   CO2 25 03/12/2018 0844   BUN 15 03/12/2018 0844   CREATININE 1.23 03/12/2018 0844   CREATININE 1.22 (H) 08/03/2017 1518      Component Value Date/Time   CALCIUM 8.2 (L) 03/12/2018 0844   ALKPHOS 48 03/12/2018 0844   AST 14 (L) 03/12/2018 0844   ALT 12 03/12/2018 0844   BILITOT 0.5 03/12/2018 0844      Impression and Plan: Brendan Gonzalez is a 84 year old white male who has a large destructive lesion in the left sacrum. He has a diffuse large B-cell lymphoma.    We will plan to have him come back in 2 weeks.  By then, he should be well enough that he could have treatment.  Again treatment is working very nicely.  I would hate to stop it now and then have his lymphoma come back.  I realize that he is 84 years old.  I realize that this is likely a process we are not going to cure.  However, I also realize that her quality of life is going to be our goal here and by continue him on treatment I think we can afford him a better quality of life long-term as we will get his lymphoma in remission.  His son understands all this and agrees.  We will have him come back in 2 weeks.   Volanda Napoleon, MD 12/2/201910:20 AM

## 2018-03-13 ENCOUNTER — Inpatient Hospital Stay: Payer: Medicare Other

## 2018-03-13 LAB — BETA 2 MICROGLOBULIN, SERUM: BETA 2 MICROGLOBULIN: 3.7 mg/L — AB (ref 0.6–2.4)

## 2018-03-26 ENCOUNTER — Other Ambulatory Visit: Payer: Self-pay

## 2018-03-26 ENCOUNTER — Inpatient Hospital Stay: Payer: Medicare Other

## 2018-03-26 ENCOUNTER — Inpatient Hospital Stay (HOSPITAL_BASED_OUTPATIENT_CLINIC_OR_DEPARTMENT_OTHER): Payer: Medicare Other | Admitting: Hematology & Oncology

## 2018-03-26 ENCOUNTER — Encounter: Payer: Self-pay | Admitting: Hematology & Oncology

## 2018-03-26 VITALS — BP 97/58 | HR 67 | Temp 97.8°F | Resp 18 | Wt 150.5 lb

## 2018-03-26 VITALS — BP 95/53 | HR 61 | Temp 97.6°F | Resp 16

## 2018-03-26 DIAGNOSIS — Z5112 Encounter for antineoplastic immunotherapy: Secondary | ICD-10-CM | POA: Diagnosis not present

## 2018-03-26 DIAGNOSIS — C8330 Diffuse large B-cell lymphoma, unspecified site: Secondary | ICD-10-CM

## 2018-03-26 DIAGNOSIS — C8335 Diffuse large B-cell lymphoma, lymph nodes of inguinal region and lower limb: Secondary | ICD-10-CM

## 2018-03-26 DIAGNOSIS — R531 Weakness: Secondary | ICD-10-CM | POA: Diagnosis not present

## 2018-03-26 DIAGNOSIS — C7951 Secondary malignant neoplasm of bone: Secondary | ICD-10-CM

## 2018-03-26 DIAGNOSIS — C8580 Other specified types of non-Hodgkin lymphoma, unspecified site: Principal | ICD-10-CM

## 2018-03-26 DIAGNOSIS — Z79899 Other long term (current) drug therapy: Secondary | ICD-10-CM | POA: Diagnosis not present

## 2018-03-26 DIAGNOSIS — Z923 Personal history of irradiation: Secondary | ICD-10-CM | POA: Diagnosis not present

## 2018-03-26 DIAGNOSIS — M549 Dorsalgia, unspecified: Secondary | ICD-10-CM

## 2018-03-26 DIAGNOSIS — M791 Myalgia, unspecified site: Secondary | ICD-10-CM

## 2018-03-26 DIAGNOSIS — Z5111 Encounter for antineoplastic chemotherapy: Secondary | ICD-10-CM | POA: Diagnosis not present

## 2018-03-26 LAB — CMP (CANCER CENTER ONLY)
ALK PHOS: 48 U/L (ref 38–126)
ALT: 12 U/L (ref 0–44)
AST: 18 U/L (ref 15–41)
Albumin: 3.3 g/dL — ABNORMAL LOW (ref 3.5–5.0)
Anion gap: 7 (ref 5–15)
BILIRUBIN TOTAL: 0.5 mg/dL (ref 0.3–1.2)
BUN: 16 mg/dL (ref 8–23)
CO2: 26 mmol/L (ref 22–32)
CREATININE: 1.01 mg/dL (ref 0.61–1.24)
Calcium: 8.2 mg/dL — ABNORMAL LOW (ref 8.9–10.3)
Chloride: 103 mmol/L (ref 98–111)
GFR, Est AFR Am: >60 mL/min (ref >60)
GLUCOSE: 140 mg/dL — AB (ref 70–99)
POTASSIUM: 4.2 mmol/L (ref 3.5–5.1)
Sodium: 136 mmol/L (ref 135–145)
TOTAL PROTEIN: 5.3 g/dL — AB (ref 6.5–8.1)

## 2018-03-26 LAB — CBC WITH DIFFERENTIAL (CANCER CENTER ONLY)
ABS IMMATURE GRANULOCYTES: 0.03 10*3/uL (ref 0.00–0.07)
Basophils Absolute: 0 10*3/uL (ref 0.0–0.1)
Basophils Relative: 1 %
EOS ABS: 0.2 10*3/uL (ref 0.0–0.5)
Eosinophils Relative: 5 %
HEMATOCRIT: 33 % — AB (ref 39.0–52.0)
HEMOGLOBIN: 10.5 g/dL — AB (ref 13.0–17.0)
IMMATURE GRANULOCYTES: 1 %
LYMPHS ABS: 0.6 10*3/uL — AB (ref 0.7–4.0)
LYMPHS PCT: 13 %
MCH: 29.8 pg (ref 26.0–34.0)
MCHC: 31.8 g/dL (ref 30.0–36.0)
MCV: 93.8 fL (ref 80.0–100.0)
MONOS PCT: 18 %
Monocytes Absolute: 0.8 10*3/uL (ref 0.1–1.0)
Neutro Abs: 2.9 10*3/uL (ref 1.7–7.7)
Neutrophils Relative %: 62 %
Platelet Count: 193 10*3/uL (ref 150–400)
RBC: 3.52 MIL/uL — ABNORMAL LOW (ref 4.22–5.81)
RDW: 16.1 % — AB (ref 11.5–15.5)
WBC Count: 4.7 10*3/uL (ref 4.0–10.5)
nRBC: 0 % (ref 0.0–0.2)

## 2018-03-26 LAB — LACTATE DEHYDROGENASE: LDH: 160 U/L (ref 98–192)

## 2018-03-26 MED ORDER — SODIUM CHLORIDE 0.9 % IV SOLN
72.0000 mg/m2 | Freq: Once | INTRAVENOUS | Status: AC
  Administered 2018-03-26: 125 mg via INTRAVENOUS
  Filled 2018-03-26: qty 5

## 2018-03-26 MED ORDER — SODIUM CHLORIDE 0.9 % IV SOLN
Freq: Once | INTRAVENOUS | Status: AC
  Administered 2018-03-26: 09:00:00 via INTRAVENOUS
  Filled 2018-03-26: qty 250

## 2018-03-26 MED ORDER — DEXAMETHASONE SODIUM PHOSPHATE 10 MG/ML IJ SOLN
INTRAMUSCULAR | Status: AC
  Filled 2018-03-26: qty 1

## 2018-03-26 MED ORDER — PALONOSETRON HCL INJECTION 0.25 MG/5ML
INTRAVENOUS | Status: AC
  Filled 2018-03-26: qty 5

## 2018-03-26 MED ORDER — DIPHENHYDRAMINE HCL 25 MG PO CAPS
ORAL_CAPSULE | ORAL | Status: AC
  Filled 2018-03-26: qty 2

## 2018-03-26 MED ORDER — DEXAMETHASONE SODIUM PHOSPHATE 10 MG/ML IJ SOLN
10.0000 mg | Freq: Once | INTRAMUSCULAR | Status: AC
  Administered 2018-03-26: 10 mg via INTRAVENOUS

## 2018-03-26 MED ORDER — ACETAMINOPHEN 325 MG PO TABS
650.0000 mg | ORAL_TABLET | Freq: Once | ORAL | Status: AC
  Administered 2018-03-26: 650 mg via ORAL

## 2018-03-26 MED ORDER — MEPERIDINE HCL 25 MG/ML IJ SOLN: 6.2500 mg | Freq: Once | INTRAMUSCULAR | Status: DC

## 2018-03-26 MED ORDER — DIPHENHYDRAMINE HCL 25 MG PO CAPS
50.0000 mg | ORAL_CAPSULE | Freq: Once | ORAL | Status: AC
  Administered 2018-03-26: 50 mg via ORAL

## 2018-03-26 MED ORDER — SODIUM CHLORIDE 0.9 % IV SOLN
375.0000 mg/m2 | Freq: Once | INTRAVENOUS | Status: AC
  Administered 2018-03-26: 700 mg via INTRAVENOUS
  Filled 2018-03-26: qty 50

## 2018-03-26 MED ORDER — ACETAMINOPHEN 325 MG PO TABS
ORAL_TABLET | ORAL | Status: AC
  Filled 2018-03-26: qty 2

## 2018-03-26 MED ORDER — PALONOSETRON HCL INJECTION 0.25 MG/5ML
0.2500 mg | Freq: Once | INTRAVENOUS | Status: AC
  Administered 2018-03-26: 0.25 mg via INTRAVENOUS

## 2018-03-26 NOTE — Patient Instructions (Signed)
[place] Discharge Instructions for Patients Receiving Chemotherapy  Today you received the following chemotherapy agents Rituxan and Bendeka .  To help prevent nausea and vomiting after your treatment, we encourage you to take your nausea medication as directed BUT NO ZOFRAN FOR 3 DAYS AFTER CHEMO.   If you develop nausea and vomiting that is not controlled by your nausea medication, call the clinic.   BELOW ARE SYMPTOMS THAT SHOULD BE REPORTED IMMEDIATELY:  *FEVER GREATER THAN 100.5 F  *CHILLS WITH OR WITHOUT FEVER  NAUSEA AND VOMITING THAT IS NOT CONTROLLED WITH YOUR NAUSEA MEDICATION  *UNUSUAL SHORTNESS OF BREATH  *UNUSUAL BRUISING OR BLEEDING  TENDERNESS IN MOUTH AND THROAT WITH OR WITHOUT PRESENCE OF ULCERS  *URINARY PROBLEMS  *BOWEL PROBLEMS  UNUSUAL RASH Items with * indicate a potential emergency and should be followed up as soon as possible.  Feel free to call the clinic you have any questions or concerns. The clinic phone number is [phone].  Please show the [place] at check-in to the Emergency Department and triage nurse.

## 2018-03-26 NOTE — Progress Notes (Signed)
Dr. [name] aware that when I titrated up to 1101ml/hr (the max rate ) he had some Rigors chills present when nurse was attempting to get his VS. He has no other complaints. He reports it started 10 mins prior to that observation by the nurse so that rate was 156ml/hr . Infusion of Rituxan stopped and Md made aware. NS running and patient was covered with more blankets per his request.   Now patient reports feeling well and NO chills or tremors. MD aware and ordered to restart- began with rate below the last bump up of 162ml/hr

## 2018-03-26 NOTE — Progress Notes (Signed)
Hematology and Oncology Follow Up Visit  Brendan Gonzalez [id number] Nov 13, 1919 84 y.o. 03/26/2018   Principle Diagnosis:   Diffuse large B-cell NHL of the bone  Current Therapy:   XRT to the LEFT sacrum Rituxan/Bendamustine -- s/p cycle #2  Xgeva 120 mg sq q 3 months -- next dose 03/2018     Interim History:  Brendan Gonzalez is back for follow-up.  He looks so much better now.  We gave him a few weeks off after he was in the hospital with Streptococcus bacteremia.  He is more alert.  He is eating better.  His skin color looks better.  He has not complained of any pain.  He has had no cough.  He has had no diarrhea.  There is been no rashes.  He has had no problems with leg swelling.  I am glad that we were able to give him some extra time off.  I think this really was necessary.    Overall, his performance status is ECOG 2 at best.   Medications:  Current Outpatient Medications:  .  amLODipine (NORVASC) 10 MG tablet, Take 1 tablet (10 mg total) by mouth daily., Disp: 90 tablet, Rfl: 1 .  atenolol (TENORMIN) 100 MG tablet, Take 1 tablet (100 mg total) by mouth daily., Disp: 90 tablet, Rfl: 1 .  cholecalciferol (VITAMIN D) 1000 UNITS tablet, Take 1,000 Units by mouth daily., Disp: , Rfl:  .  vitamin C (ASCORBIC ACID) 500 MG tablet, Take 500 mg by mouth daily., Disp: , Rfl:  .  vitamin E 400 UNIT capsule, Take 400 Units by mouth daily. , Disp: , Rfl:  .  atorvastatin (LIPITOR) 10 MG tablet, TAKE ONE TABLET BY MOUTH ONCE DAILY (Patient not taking: Reported on 02/26/2018), Disp: 90 tablet, Rfl: 0 .  clopidogrel (PLAVIX) 75 MG tablet, TAKE 1 TABLET BY MOUTH DAILY (Patient not taking: Reported on 12/05/2017), Disp: 90 tablet, Rfl: 1 .  ondansetron (ZOFRAN) 8 MG tablet, Take 1 tablet (8 mg total) by mouth 2 (two) times daily as needed for refractory nausea / vomiting. Start on day 2 after bendamustine chemo (Patient not taking: Reported on 02/26/2018), Disp: 30 tablet, Rfl: 1 .  oxyCODONE (OXY  IR/ROXICODONE) 5 MG immediate release tablet, 1-2 tabs po qid prn pain (Patient not taking: Reported on 03/07/2018), Disp: 84 tablet, Rfl: 0 .  prochlorperazine (COMPAZINE) 10 MG tablet, Take 1 tablet (10 mg total) by mouth every 6 (six) hours as needed (Nausea or vomiting). (Patient not taking: Reported on 02/26/2018), Disp: 30 tablet, Rfl: 1 No current facility-administered medications for this visit.   Facility-Administered Medications Ordered in Other Visits:  .  meperidine (DEMEROL) injection 6.25 mg, 6.25 mg, Intravenous, Once, Elfego Giammarino, Rudell Cobb, MD  Allergies: No Known Allergies  Past Medical History, Surgical history, Social history, and Family History were reviewed and updated.  Review of Systems: Review of Systems  Constitutional: Negative.   HENT:  Negative.   Eyes: Negative.   Respiratory: Negative.   Cardiovascular: Negative.   Gastrointestinal: Negative.   Endocrine: Negative.   Genitourinary: Negative.    Musculoskeletal: Positive for back pain and myalgias.  Skin: Negative.   Neurological: Negative.   Hematological: Negative.   Psychiatric/Behavioral: Negative.     Physical Exam:  weight is 150 lb 8 oz (68.3 kg). His oral temperature is 97.8 F (36.6 C). His blood pressure is 97/58 (abnormal) and his pulse is 67. His respiration is 18 and oxygen saturation is 99%.   Wt Readings  from Last 3 Encounters:  03/26/18 150 lb 8 oz (68.3 kg)  03/12/18 146 lb (66.2 kg)  03/07/18 147 lb (66.7 kg)    Physical Exam Vitals signs reviewed.  HENT:     Head: Normocephalic and atraumatic.  Eyes:     Pupils: Pupils are equal, round, and reactive to light.  Neck:     Musculoskeletal: Normal range of motion.  Cardiovascular:     Rate and Rhythm: Normal rate and regular rhythm.     Heart sounds: Normal heart sounds.  Pulmonary:     Effort: Pulmonary effort is normal.     Breath sounds: Normal breath sounds.  Abdominal:     General: Bowel sounds are normal.      Palpations: Abdomen is soft.  Musculoskeletal: Normal range of motion.        General: No tenderness or deformity.  Lymphadenopathy:     Cervical: No cervical adenopathy.  Skin:    General: Skin is warm and dry.     Findings: No erythema or rash.  Neurological:     Mental Status: He is alert and oriented to person, place, and time.  Psychiatric:        Behavior: Behavior normal.        Thought Content: Thought content normal.        Judgment: Judgment normal.      Lab Results  Component Value Date   WBC 4.7 03/26/2018   HGB 10.5 (L) 03/26/2018   HCT 33.0 (L) 03/26/2018   MCV 93.8 03/26/2018   PLT 193 03/26/2018     Chemistry      Component Value Date/Time   NA 136 03/26/2018 0745   K 4.2 03/26/2018 0745   CL 103 03/26/2018 0745   CO2 26 03/26/2018 0745   BUN 16 03/26/2018 0745   CREATININE 1.01 03/26/2018 0745   CREATININE 1.22 (H) 08/03/2017 1518      Component Value Date/Time   CALCIUM 8.2 (L) 03/26/2018 0745   ALKPHOS 48 03/26/2018 0745   AST 18 03/26/2018 0745   ALT 12 03/26/2018 0745   BILITOT 0.5 03/26/2018 0745      Impression and Plan: Brendan Gonzalez is a 84 year old white male who has a large destructive lesion in the left sacrum. He has a diffuse large B-cell lymphoma.    We will proceed with his third cycle of treatment.  Again, I really think that he has tolerated treatment so nicely.  It has worked incredibly well by his last PET scan.  We will give a total of 6 cycles of treatment.  Then, we will then assess for any type of follow-up after that.  I will do a PET scan after his fourth cycle.  We will get him back in 4 weeks.    Volanda Napoleon, MD 12/16/20191:48 PM

## 2018-03-27 ENCOUNTER — Other Ambulatory Visit: Payer: Self-pay

## 2018-03-27 ENCOUNTER — Inpatient Hospital Stay: Payer: Medicare Other

## 2018-03-27 VITALS — BP 118/45 | HR 70 | Temp 98.3°F

## 2018-03-27 DIAGNOSIS — Z79899 Other long term (current) drug therapy: Secondary | ICD-10-CM | POA: Diagnosis not present

## 2018-03-27 DIAGNOSIS — C8330 Diffuse large B-cell lymphoma, unspecified site: Secondary | ICD-10-CM

## 2018-03-27 DIAGNOSIS — Z5112 Encounter for antineoplastic immunotherapy: Secondary | ICD-10-CM | POA: Diagnosis not present

## 2018-03-27 DIAGNOSIS — M549 Dorsalgia, unspecified: Secondary | ICD-10-CM | POA: Diagnosis not present

## 2018-03-27 DIAGNOSIS — M791 Myalgia, unspecified site: Secondary | ICD-10-CM | POA: Diagnosis not present

## 2018-03-27 DIAGNOSIS — C8335 Diffuse large B-cell lymphoma, lymph nodes of inguinal region and lower limb: Secondary | ICD-10-CM | POA: Diagnosis not present

## 2018-03-27 DIAGNOSIS — C7951 Secondary malignant neoplasm of bone: Secondary | ICD-10-CM | POA: Diagnosis not present

## 2018-03-27 DIAGNOSIS — Z923 Personal history of irradiation: Secondary | ICD-10-CM | POA: Diagnosis not present

## 2018-03-27 DIAGNOSIS — R531 Weakness: Secondary | ICD-10-CM | POA: Diagnosis not present

## 2018-03-27 DIAGNOSIS — C8580 Other specified types of non-Hodgkin lymphoma, unspecified site: Principal | ICD-10-CM

## 2018-03-27 DIAGNOSIS — Z5111 Encounter for antineoplastic chemotherapy: Secondary | ICD-10-CM | POA: Diagnosis not present

## 2018-03-27 MED ORDER — DEXAMETHASONE SODIUM PHOSPHATE 10 MG/ML IJ SOLN
INTRAMUSCULAR | Status: AC
  Filled 2018-03-27: qty 1

## 2018-03-27 MED ORDER — SODIUM CHLORIDE 0.9 % IV SOLN
Freq: Once | INTRAVENOUS | Status: AC
  Administered 2018-03-27: 10:00:00 via INTRAVENOUS
  Filled 2018-03-27: qty 250

## 2018-03-27 MED ORDER — DEXAMETHASONE SODIUM PHOSPHATE 10 MG/ML IJ SOLN
10.0000 mg | Freq: Once | INTRAMUSCULAR | Status: AC
  Administered 2018-03-27: 10 mg via INTRAVENOUS

## 2018-03-27 MED ORDER — SODIUM CHLORIDE 0.9 % IV SOLN
72.0000 mg/m2 | Freq: Once | INTRAVENOUS | Status: AC
  Administered 2018-03-27: 125 mg via INTRAVENOUS
  Filled 2018-03-27: qty 5

## 2018-03-27 NOTE — Patient Instructions (Signed)

## 2018-04-05 ENCOUNTER — Encounter: Payer: Self-pay | Admitting: Family Medicine

## 2018-04-05 ENCOUNTER — Ambulatory Visit: Payer: Medicare Other | Admitting: Family Medicine

## 2018-04-05 VITALS — BP 112/74 | HR 89 | Temp 99.9°F | Resp 16 | Ht 65.0 in | Wt 144.0 lb

## 2018-04-05 DIAGNOSIS — M109 Gout, unspecified: Secondary | ICD-10-CM | POA: Diagnosis not present

## 2018-04-05 DIAGNOSIS — M25531 Pain in right wrist: Secondary | ICD-10-CM | POA: Diagnosis not present

## 2018-04-05 DIAGNOSIS — R627 Adult failure to thrive: Secondary | ICD-10-CM

## 2018-04-05 DIAGNOSIS — M25532 Pain in left wrist: Secondary | ICD-10-CM | POA: Diagnosis not present

## 2018-04-05 DIAGNOSIS — C8330 Diffuse large B-cell lymphoma, unspecified site: Secondary | ICD-10-CM | POA: Diagnosis not present

## 2018-04-05 DIAGNOSIS — I10 Essential (primary) hypertension: Secondary | ICD-10-CM | POA: Diagnosis not present

## 2018-04-05 DIAGNOSIS — L03113 Cellulitis of right upper limb: Secondary | ICD-10-CM | POA: Diagnosis not present

## 2018-04-05 DIAGNOSIS — S12190A Other displaced fracture of second cervical vertebra, initial encounter for closed fracture: Secondary | ICD-10-CM | POA: Diagnosis not present

## 2018-04-05 DIAGNOSIS — M6281 Muscle weakness (generalized): Secondary | ICD-10-CM | POA: Diagnosis not present

## 2018-04-05 DIAGNOSIS — Z9189 Other specified personal risk factors, not elsewhere classified: Secondary | ICD-10-CM

## 2018-04-05 DIAGNOSIS — L03114 Cellulitis of left upper limb: Secondary | ICD-10-CM | POA: Diagnosis not present

## 2018-04-05 DIAGNOSIS — Z87891 Personal history of nicotine dependence: Secondary | ICD-10-CM | POA: Diagnosis not present

## 2018-04-05 DIAGNOSIS — S129XXA Fracture of neck, unspecified, initial encounter: Secondary | ICD-10-CM | POA: Diagnosis not present

## 2018-04-05 DIAGNOSIS — R509 Fever, unspecified: Secondary | ICD-10-CM | POA: Diagnosis not present

## 2018-04-05 DIAGNOSIS — R6251 Failure to thrive (child): Secondary | ICD-10-CM

## 2018-04-05 DIAGNOSIS — E785 Hyperlipidemia, unspecified: Secondary | ICD-10-CM | POA: Diagnosis not present

## 2018-04-05 DIAGNOSIS — S12110A Anterior displaced Type II dens fracture, initial encounter for closed fracture: Secondary | ICD-10-CM | POA: Diagnosis not present

## 2018-04-05 DIAGNOSIS — C8580 Other specified types of non-Hodgkin lymphoma, unspecified site: Secondary | ICD-10-CM

## 2018-04-05 DIAGNOSIS — N183 Chronic kidney disease, stage 3 unspecified: Secondary | ICD-10-CM

## 2018-04-05 DIAGNOSIS — Z66 Do not resuscitate: Secondary | ICD-10-CM | POA: Diagnosis not present

## 2018-04-05 DIAGNOSIS — I808 Phlebitis and thrombophlebitis of other sites: Secondary | ICD-10-CM | POA: Diagnosis not present

## 2018-04-05 DIAGNOSIS — R278 Other lack of coordination: Secondary | ICD-10-CM | POA: Diagnosis not present

## 2018-04-05 DIAGNOSIS — S12100D Unspecified displaced fracture of second cervical vertebra, subsequent encounter for fracture with routine healing: Secondary | ICD-10-CM | POA: Diagnosis not present

## 2018-04-05 DIAGNOSIS — S12100A Unspecified displaced fracture of second cervical vertebra, initial encounter for closed fracture: Secondary | ICD-10-CM | POA: Diagnosis not present

## 2018-04-05 DIAGNOSIS — Z7902 Long term (current) use of antithrombotics/antiplatelets: Secondary | ICD-10-CM | POA: Diagnosis not present

## 2018-04-05 NOTE — Patient Instructions (Signed)
[place]

## 2018-04-05 NOTE — Progress Notes (Signed)
OFFICE VISIT  04/11/2018   CC:  Chief Complaint  Patient presents with  . Follow-up    CMC, but is having multiple problems, running a 101 temp on Christmas eve, jaw pain, shoulder, wrist pain.   HPI:    Patient is a 84 y.o. Caucasian male who presents accompanied by his wife and son for f/u HTN, CRI 2/3. He is in the midst of treatment for large b cell lymphoma--palliative (he is s/p radiation to sacral lesion, + chemo).  He is not feeling well at all lately. Right after most recent chemo 12/16 he started to get very weak, poor appetite.  No n/v. Tm 101 on 04/03/18.  Family states they tried to contact oncology office. He was eventually rx'd amoxil from some company assoc with [employer] who has assigned him a "PA".   He started this med 5 d/a--has 2 more days of pills.  He has not improved any really, just possibly a little lower fever.  Family giving tylenol. He complains of his usual neck pain pain in R wrist.  No hx of fall. He hurts "just a little bit" (has been hurting) in R sided teeth focally, but can't describe exactly where. He gets oxycodone some and this helps.  Denies URI sx's or cough or ST.  No urinary sx's. He was on xanthine oxidase inhibitor at some point but has not been lately and his son is concerned he may be having some systemic effects for elevated uric acid.  Pt, patient's son, and pt's wife express lots of anger and disappointment surrounding the care of Mr. Kathol in [place] hosp last time he was admitted there. At this time they remain committed to the current course of treatment-->palliative chemo. He did have a good quality of life, good appetite, good energy level for the 2 week break he had between d/c from last hospitalization and his most recent chemo treatment.  Past Medical History:  Diagnosis Date  . C2 cervical fracture ([place]) 07/2017   Type II morphology with posterior displacement--C collar (potentially indefinitely).  . Chronic lower back pain   .  Chronic renal insufficiency, stage III (moderate) (HCC)    GFR 50s-60s  . Diffuse large cell non-Hodgkin's lymphoma ([place]) 12/15/2017   B cell NHL involving bones only as of 01/2018.  RT 12/2017 helped with his pain; pt is to get hemo 01/2018.  PET 03/06/18: good response to tx.   . Goals of care, counseling/discussion 12/15/2017  . History of colon cancer 1993  . History of CVA (cerebrovascular accident)    Left MCA territory.  Carotid dopplers ok, Brain MRA ok, ECHO with grd I DD.  As of 02/2018->no ASA or plavix or statin due to pt getting chemo and RT + the related alteration in goals of care.  . Hypertension   . Lumbar spinal stenosis    Surgery 2012 helped but in 2015 pain has returned  . Lytic bone lesions on xray 08/2017   Skull, mildly abnormal bone scan. SPEP/UPEP w/out monoclonal spike.  Dr. Marin Olp eval 08/2017: plan CT C/A/P but not done as of 11/22/17.  ED visit s/p fall 11/17/17-->multiple lytic lesions now, L sacrum destroyed by tumor, lytic lesion in pelvis. Dr. Marin Olp has arranged for bx, also plan for RT and XGeva.  . 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  RT 12/2017, set to get chemo 01/2018.  . Osteoarthritis   . Venous insufficiency of both lower extremities  made worse by amlodipine    Past Surgical History:  Procedure Laterality Date  . CHOLECYSTECTOMY  1984  . INGUINAL HERNIA REPAIR  1974   Bilat  . LUMBAR SPINE SURGERY  2012   for spinal stenosis ([place]elle [place]  . LUNG LOBECTOMY  1993   LLL per pt--worry of possible cancer but turned out to be benign.  [name] PARTIAL COLECTOMY  1993   for colon cancer  . TRANSTHORACIC ECHOCARDIOGRAM  03/15/2016   Normal EF, grd I DD, mod dil aortic root    Outpatient Medications Prior to Visit  Medication Sig Dispense Refill  . amLODipine (NORVASC) 10 MG tablet Take 1 tablet (10 mg total) by mouth daily. 90 tablet 1  . atenolol (TENORMIN) 100 MG tablet Take 1 tablet (100 mg  total) by mouth daily. 90 tablet 1  . cholecalciferol (VITAMIN D) 1000 UNITS tablet Take 1,000 Units by mouth daily.    . ondansetron (ZOFRAN) 8 MG tablet Take 1 tablet (8 mg total) by mouth 2 (two) times daily as needed for refractory nausea / vomiting. Start on day 2 after bendamustine chemo (Patient not taking: Reported on 02/26/2018) 30 tablet 1  . oxyCODONE (OXY IR/ROXICODONE) 5 MG immediate release tablet 1-2 tabs po qid prn pain (Patient not taking: Reported on 03/07/2018) 84 tablet 0  . vitamin C (ASCORBIC ACID) 500 MG tablet Take 500 mg by mouth daily.    . vitamin E 400 UNIT capsule Take 400 Units by mouth daily.     [name] atorvastatin (LIPITOR) 10 MG tablet TAKE ONE TABLET BY MOUTH ONCE DAILY (Patient not taking: Reported on 02/26/2018) 90 tablet 0  . clopidogrel (PLAVIX) 75 MG tablet TAKE 1 TABLET BY MOUTH DAILY (Patient not taking: Reported on 04/05/2018) 90 tablet 1  . prochlorperazine (COMPAZINE) 10 MG tablet Take 1 tablet (10 mg total) by mouth every 6 (six) hours as needed (Nausea or vomiting). (Patient not taking: Reported on 02/26/2018) 30 tablet 1   No facility-administered medications prior to visit.     No Known Allergies  ROS As per HPI  PE: Blood pressure 112/74, pulse 89, temperature 99.9 F (37.7 C), temperature source Oral, resp. rate 16, height 5\' 5"  (1.651 m), weight 144 lb (65.3 kg), SpO2 99 %. Gen: Alert, tired-appearing, sitting in WC.  Patient is oriented to person, place, time, and situation. DEY:CXKG: no injection, icteris, swelling, or exudate.  EOMI, PERRLA. Mouth: lips without lesion/swelling.  Oral mucosa pink and moist. Oropharynx without erythema, exudate, or swelling.  EARS normal. Teeth w/out signific abnormality. CV: distant S1 AND S2, regular, no audible m/r. Chest is clear, no wheezing or rales. Normal symmetric air entry throughout both lung fields. No chest wall deformities or tenderness. EXT: no clubbing or cyanosis.  no edema.  R wrist warm,  slightly swollen, and signif TTP.   LABS:  Lab Results  Component Value Date   TSH 0.150 (L) 02/27/2018   Lab Results  Component Value Date   WBC 4.7 03/26/2018   HGB 10.5 (L) 03/26/2018   HCT 33.0 (L) 03/26/2018   MCV 93.8 03/26/2018   PLT 193 03/26/2018   Lab Results  Component Value Date   CREATININE 1.01 03/26/2018   BUN 16 03/26/2018   NA 136 03/26/2018   K 4.2 03/26/2018   CL 103 03/26/2018   CO2 26 03/26/2018   Lab Results  Component Value Date   ALT 12 03/26/2018   AST 18 03/26/2018   ALKPHOS 48 03/26/2018  BILITOT 0.5 03/26/2018   Lab Results  Component Value Date   CHOL 133 04/13/2017   Lab Results  Component Value Date   HDL 68 04/13/2017   Lab Results  Component Value Date   LDLCALC 49 04/13/2017   Lab Results  Component Value Date   TRIG 80 04/13/2017   Lab Results  Component Value Date   CHOLHDL 2.0 04/13/2017   Lab Results  Component Value Date   PSA 2.5 07/20/2017   Lab Results  Component Value Date   HGBA1C 5.2 03/15/2016    IMPRESSION AND PLAN:  1) Fever, malaise-->pt with NHL and this started shortly after most recent chemo tx about 10d/a. He is still fighting his cancer and is not ready to start hospice. I recommended he go to hospital/ED for complete fever w/u and inpt obs-->empiric tx for bacteremia, etc.  Pt and family only willing to got to [facility] for this so they agreed to go there from my office today.  2) Right wrist pain/arthritis: possibly gout secondary to excessive uric acid accumulation associated with chemotherapy.  3) HTN: The current medical regimen is effective;  continue present plan and medications.  4) CRI: sCr 1.0, (GFR >60 ml/min) and lytes good on most recent day of chemo, 03/26/18.  Spent 40 min with pt today, with >50% of this time spent in counseling and care coordination regarding the above problems.  An After Visit Summary was printed and given to the patient.  FOLLOW UP: Return for to be  determined based on ED/hosp eval.  Signed:  Crissie Sickles, MD           04/11/2018

## 2018-04-09 ENCOUNTER — Ambulatory Visit: Payer: Medicare Other

## 2018-04-09 ENCOUNTER — Ambulatory Visit: Payer: Medicare Other | Admitting: Hematology & Oncology

## 2018-04-09 ENCOUNTER — Telehealth: Payer: Self-pay | Admitting: *Deleted

## 2018-04-09 ENCOUNTER — Other Ambulatory Visit: Payer: Medicare Other

## 2018-04-09 MED ORDER — MAGNESIUM OXIDE 400 MG PO TABS
400.00 | ORAL_TABLET | ORAL | Status: DC
Start: 2018-04-10 — End: 2018-04-09

## 2018-04-09 MED ORDER — AMLODIPINE BESYLATE 5 MG PO TABS
10.00 | ORAL_TABLET | ORAL | Status: DC
Start: 2018-04-11 — End: 2018-04-09

## 2018-04-09 MED ORDER — METOPROLOL TARTRATE 25 MG PO TABS
100.00 | ORAL_TABLET | ORAL | Status: DC
Start: 2018-04-10 — End: 2018-04-09

## 2018-04-09 MED ORDER — HEPARIN SODIUM (PORCINE) 5000 UNIT/ML IJ SOLN
5000.00 | INTRAMUSCULAR | Status: DC
Start: 2018-04-10 — End: 2018-04-09

## 2018-04-09 MED ORDER — OXYCODONE HCL 5 MG PO TABS
5.00 | ORAL_TABLET | ORAL | Status: DC
Start: ? — End: 2018-04-09

## 2018-04-09 MED ORDER — CEPHALEXIN 500 MG PO CAPS
500.00 | ORAL_CAPSULE | ORAL | Status: DC
Start: 2018-04-10 — End: 2018-04-09

## 2018-04-09 MED ORDER — CELECOXIB 200 MG PO CAPS
200.00 | ORAL_CAPSULE | ORAL | Status: DC
Start: 2018-04-10 — End: 2018-04-09

## 2018-04-09 NOTE — Telephone Encounter (Signed)
Patient's son notifying the office that he has been admitted to [facility] for cellulitis and should be discharged today. He is being discharged to a rehab facility, but they need confirmation from Dr Marin Olp that while patient is in the facility, that he will not be pursuing any additional treatment.  Spoke with Dr Marin Olp. He agrees to hold on Chemo until patient is recovered and discharged from rehab.   Son is aware of Dr Marin Olp agreement. Son also wants me to pass along that he believes patient is having issues with uric acid about 1-2 weeks after his infusions, and that he might possibly need medication in the future to help with this.   Dr Marin Olp notified of son's concerns

## 2018-04-10 ENCOUNTER — Ambulatory Visit: Payer: Medicare Other

## 2018-04-10 DIAGNOSIS — S12110A Anterior displaced Type II dens fracture, initial encounter for closed fracture: Secondary | ICD-10-CM | POA: Diagnosis not present

## 2018-04-10 DIAGNOSIS — L03114 Cellulitis of left upper limb: Secondary | ICD-10-CM | POA: Diagnosis not present

## 2018-04-10 DIAGNOSIS — M6281 Muscle weakness (generalized): Secondary | ICD-10-CM | POA: Diagnosis not present

## 2018-04-10 DIAGNOSIS — S12100D Unspecified displaced fracture of second cervical vertebra, subsequent encounter for fracture with routine healing: Secondary | ICD-10-CM | POA: Diagnosis not present

## 2018-04-10 DIAGNOSIS — C8330 Diffuse large B-cell lymphoma, unspecified site: Secondary | ICD-10-CM | POA: Diagnosis not present

## 2018-04-10 DIAGNOSIS — E785 Hyperlipidemia, unspecified: Secondary | ICD-10-CM | POA: Diagnosis not present

## 2018-04-10 DIAGNOSIS — L03113 Cellulitis of right upper limb: Secondary | ICD-10-CM | POA: Diagnosis not present

## 2018-04-10 DIAGNOSIS — R278 Other lack of coordination: Secondary | ICD-10-CM | POA: Diagnosis not present

## 2018-04-10 DIAGNOSIS — I10 Essential (primary) hypertension: Secondary | ICD-10-CM | POA: Diagnosis not present

## 2018-04-10 DIAGNOSIS — S129XXA Fracture of neck, unspecified, initial encounter: Secondary | ICD-10-CM | POA: Diagnosis not present

## 2018-04-10 DIAGNOSIS — C8590 Non-Hodgkin lymphoma, unspecified, unspecified site: Secondary | ICD-10-CM | POA: Diagnosis not present

## 2018-04-10 DIAGNOSIS — Z87891 Personal history of nicotine dependence: Secondary | ICD-10-CM | POA: Diagnosis not present

## 2018-04-10 DIAGNOSIS — Z7902 Long term (current) use of antithrombotics/antiplatelets: Secondary | ICD-10-CM | POA: Diagnosis not present

## 2018-04-14 ENCOUNTER — Telehealth: Payer: Self-pay | Admitting: Hematology & Oncology

## 2018-04-14 NOTE — Telephone Encounter (Signed)
MEDICAL RECORDS REQUEST   REQUEST FAXED TO: [place], [place]  PHONE # [phone] X [phone] FAX # [phone]  REQUEST INDEXED

## 2018-04-20 ENCOUNTER — Other Ambulatory Visit: Payer: Self-pay | Admitting: *Deleted

## 2018-04-20 DIAGNOSIS — C8580 Other specified types of non-Hodgkin lymphoma, unspecified site: Principal | ICD-10-CM

## 2018-04-20 DIAGNOSIS — D696 Thrombocytopenia, unspecified: Secondary | ICD-10-CM

## 2018-04-20 DIAGNOSIS — C8330 Diffuse large B-cell lymphoma, unspecified site: Secondary | ICD-10-CM

## 2018-04-23 ENCOUNTER — Telehealth: Payer: Self-pay | Admitting: *Deleted

## 2018-04-23 ENCOUNTER — Ambulatory Visit: Payer: Medicare Other

## 2018-04-23 ENCOUNTER — Ambulatory Visit: Payer: Medicare Other | Admitting: Hematology & Oncology

## 2018-04-23 ENCOUNTER — Inpatient Hospital Stay: Payer: Medicare Other

## 2018-04-23 NOTE — Telephone Encounter (Signed)
Call received from patient's son over the weekend to cancel appts on 04/23/2018 d/t pt is in rehab at this time.  Appointments canceled per sons request.

## 2018-04-24 ENCOUNTER — Ambulatory Visit: Payer: Medicare Other

## 2018-04-24 DIAGNOSIS — S12110A Anterior displaced Type II dens fracture, initial encounter for closed fracture: Secondary | ICD-10-CM | POA: Diagnosis not present

## 2018-04-24 DIAGNOSIS — I10 Essential (primary) hypertension: Secondary | ICD-10-CM | POA: Diagnosis not present

## 2018-04-24 DIAGNOSIS — C8590 Non-Hodgkin lymphoma, unspecified, unspecified site: Secondary | ICD-10-CM | POA: Diagnosis not present

## 2018-04-24 DIAGNOSIS — L03114 Cellulitis of left upper limb: Secondary | ICD-10-CM | POA: Diagnosis not present

## 2018-04-27 ENCOUNTER — Ambulatory Visit: Payer: Self-pay

## 2018-04-27 NOTE — Telephone Encounter (Signed)
Wife reports he still "has some depression - stays in bed more." Pt. States he is eating and sleeping well. No suicidal thoughts or thoughts of harming anyone. Wife states he has an appointment 05/09/18 for follow up, but would like him to be seen sooner, if possible. Please advise.  Answer Assessment - Initial Assessment Questions 1. CONCERN: "What happened that made you call today?"     Pt. Wants to stay in the bed  2. DEPRESSION SYMPTOM SCREENING: "How are you feeling overall?" (e.g., decreased energy, increased sleeping or difficulty sleeping, difficulty concentrating, feelings of sadness, guilt, hopelessness, or worthlessness)     Eating and sleeping well  3. RISK OF HARM - SUICIDAL IDEATION:  "Do you ever have thoughts of hurting or killing yourself?"  (e.g., yes, no, no but preoccupation with thoughts about death)   - INTENT:  "Do you have thoughts of hurting or killing yourself right NOW?" (e.g., yes, no, N/A)   - PLAN: "Do you have a specific plan for how you would do this?" (e.g., gun, knife, overdose, no plan, N/A)     No 4. RISK OF HARM - HOMICIDAL IDEATION:  "Do you ever have thoughts of hurting or killing someone else?"  (e.g., yes, no, no but preoccupation with thoughts about death)   - INTENT:  "Do you have thoughts of hurting or killing someone right NOW?" (e.g., yes, no, N/A)   - PLAN: "Do you have a specific plan for how you would do this?" (e.g., gun, knife, no plan, N/A)      No 5. FUNCTIONAL IMPAIRMENT: "How have things been going for you overall in your life? Have you had any more difficulties than usual doing your normal daily activities?"  (e.g., better, same, worse; self-care, school, work, interactions)     No - stays in bed more 6. SUPPORT: "Who is with you now?" "Who do you live with?" "Do you have family or friends nearby who you can talk to?"      Wife 7. THERAPIST: "Do you have a counselor or therapist? Name?"     No 8. STRESSORS: "Has there been any new stress or  recent changes in your life?"     No 9. DRUG ABUSE/ALCOHOL: "Do you drink alcohol or use any illegal drugs?"      No 10. OTHER: "Do you have any other health or medical symptoms right now?" (e.g., fever)       No 11. PREGNANCY: "Is there any chance you are pregnant?" "When was your last menstrual period?"       n/a  Protocols used: DEPRESSION-A-AH

## 2018-04-27 NOTE — Telephone Encounter (Signed)
SW pts wife and advised her that Dr. Anitra Lauth is pretty full next week but we can see pt on 05/07/18 at 10:30am. She voiced understanding and apt was r/s to 05/07/18.

## 2018-05-02 ENCOUNTER — Ambulatory Visit: Payer: Medicare Other | Admitting: Family Medicine

## 2018-05-02 ENCOUNTER — Ambulatory Visit (HOSPITAL_BASED_OUTPATIENT_CLINIC_OR_DEPARTMENT_OTHER)
Admission: RE | Admit: 2018-05-02 | Discharge: 2018-05-02 | Disposition: A | Discharge status: Home / Self Care | Payer: Medicare Other | Source: Ambulatory Visit | Attending: Family Medicine | Admitting: Family Medicine

## 2018-05-02 ENCOUNTER — Encounter: Payer: Self-pay | Admitting: Family Medicine

## 2018-05-02 VITALS — BP 117/79 | HR 98 | Temp 98.3°F | Resp 16 | Ht 65.0 in | Wt 144.0 lb

## 2018-05-02 DIAGNOSIS — R05 Cough: Secondary | ICD-10-CM | POA: Diagnosis not present

## 2018-05-02 DIAGNOSIS — J40 Bronchitis, not specified as acute or chronic: Secondary | ICD-10-CM

## 2018-05-02 DIAGNOSIS — F4329 Adjustment disorder with other symptoms: Secondary | ICD-10-CM | POA: Diagnosis not present

## 2018-05-02 DIAGNOSIS — R059 Cough, unspecified: Secondary | ICD-10-CM

## 2018-05-02 DIAGNOSIS — F4320 Adjustment disorder, unspecified: Secondary | ICD-10-CM | POA: Insufficient documentation

## 2018-05-02 MED ORDER — CEFDINIR 300 MG PO CAPS: 300.0000 mg | ORAL_CAPSULE | Freq: Two times a day (BID) | ORAL | 0 refills | Status: DC

## 2018-05-02 NOTE — Patient Instructions (Signed)
Start omnicef every 12 hours daily for bronchitis.  Please have cxr completed at [place] today.   I will call you with results.

## 2018-05-02 NOTE — Progress Notes (Signed)
Brendan Gonzalez , [birth year], 85 y.o., male MRN: [id number] Patient Care Team    Relationship Specialty Notifications Start End  McGowen, Adrian Blackwater, MD PCP - General Family Medicine  01/27/14   Cyndia Diver, MD Consulting Physician Neurosurgery  01/27/14   Franchot Gallo, MD Consulting Physician Urology  10/06/16   Volanda Napoleon, MD Consulting Physician Oncology  08/10/17     Chief Complaint  Patient presents with  . Cough    productive cough, yellow in color.      Subjective: Pt presents for an OV w/ his wife and son, with complaints of cough of 4 days duration.  History was obtained from patient, his wife and his son.  Patient has a rather significant medical history of diffuse large B-cell lymphoma metastasis to his sacrum with a large destructive lesion.  He is on chemo treatment provided by Dr. Marin Olp office.  Family reports he was just discharged Friday from a nursing home facility, which he went into short-term after hospitalization?  Today they were eating [place] and he started to complain of a cough.  They deny any aspiration or choking. The patient denies any fevers, chills, nausea, vomit, rash or bowel changes.  Denies shortness of breath.  He states he does not eat that much and does not have a desire to eat all that much.  He states he is drinking plenty of water.  He states he feels his normal with the exception of a cough and bronchitis-like symptoms.  He reports he is being active in physical therapy.  He becomes irritated when his family suggest a depression medication. Family is concerned he is not being active enough with his physical therapy, he no longer desires to be active.  His wife states that he sits around in his chair most of the day.  They feel he needs to be started on an antidepressant.  His son states that they have spoke about possibly starting an antidepressant with his PCP in the past.  They feel he is not eating or drinking well enough to maintain  hydration.  His son states type of symptoms seem to come approximately 14 days after his chemo.  They are wondering about the need or desire for future injections if this is going to be a concern.  Depression screen [facility] 2/9 12/05/2017 07/26/2017 10/06/2015 10/06/2015  Decreased Interest 0 0 0 0  Down, Depressed, Hopeless 0 0 0 0  PHQ - 2 Score 0 0 0 0    No Known Allergies Social History   Tobacco Use  . Smoking status: Former Research scientist (life sciences)  . Smokeless tobacco: Never Used  Substance Use Topics  . Alcohol use: Yes    Comment: daily, gin every night   Past Medical History:  Diagnosis Date  . C2 cervical fracture ([place]) 07/2017   Type II morphology with posterior displacement--C collar (potentially indefinitely).  . Chronic lower back pain   . Chronic renal insufficiency, stage III (moderate) (HCC)    GFR 50s-60s  . Diffuse large cell non-Hodgkin's lymphoma ([place]) 12/15/2017   B cell NHL involving bones only as of 01/2018.  RT 12/2017 helped with his pain; pt is to get hemo 01/2018.  PET 03/06/18: good response to tx.   . Goals of care, counseling/discussion 12/15/2017  . History of colon cancer 1993  . History of CVA (cerebrovascular accident)    Left MCA territory.  Carotid dopplers ok, Brain MRA ok, ECHO with grd I DD.  As of 02/2018->no  ASA or plavix or statin due to pt getting chemo and RT + the related alteration in goals of care.  . Hypertension   . Lumbar spinal stenosis    Surgery 2012 helped but in 2015 pain has returned  . Lytic bone lesions on xray 08/2017   Skull, mildly abnormal bone scan. SPEP/UPEP w/out monoclonal spike.  Dr. Marin Olp eval 08/2017: plan CT C/A/P but not done as of 11/22/17.  ED visit s/p fall 11/17/17-->multiple lytic lesions now, L sacrum destroyed by tumor, lytic lesion in pelvis. Dr. Marin Olp has arranged for bx, also plan for RT and XGeva.  . Metastasis to bone of unknown primary ([place]) 11/27/2017   Large B cell lymphoma (bone bx).  RT 12/2017, set to get chemo  01/2018.  . Osteoarthritis   . Venous insufficiency of both lower extremities    made worse by amlodipine   Past Surgical History:  Procedure Laterality Date  . CHOLECYSTECTOMY  1984  . INGUINAL HERNIA REPAIR  1974   Bilat  . LUMBAR SPINE SURGERY  2012   for spinal stenosis (Dr. Jonelle Sports [place]  . LUNG LOBECTOMY  1993   LLL per pt--worry of possible cancer but turned out to be benign.  [name] PARTIAL COLECTOMY  1993   for colon cancer  . TRANSTHORACIC ECHOCARDIOGRAM  03/15/2016   Normal EF, grd I DD, mod dil aortic root   Family History  Problem Relation Age of Onset  . Parkinson's disease Mother   . Heart attack Mother   . Cancer Brother   . Cancer Daughter    Allergies as of 05/02/2018   No Known Allergies     Medication List       Accurate as of May 02, 2018  2:40 PM. Always use your most recent med list.        amLODipine 10 MG tablet Commonly known as:  NORVASC Take 1 tablet (10 mg total) by mouth daily.   atenolol 100 MG tablet Commonly known as:  TENORMIN Take 1 tablet (100 mg total) by mouth daily.   cholecalciferol 1000 units tablet Commonly known as:  VITAMIN D Take 1,000 Units by mouth daily.   ondansetron 8 MG tablet Commonly known as:  ZOFRAN Take 1 tablet (8 mg total) by mouth 2 (two) times daily as needed for refractory nausea / vomiting. Start on day 2 after bendamustine chemo   oxyCODONE 5 MG immediate release tablet Commonly known as:  Oxy IR/ROXICODONE 1-2 tabs po qid prn pain   vitamin C 500 MG tablet Commonly known as:  ASCORBIC ACID Take 500 mg by mouth daily.   vitamin E 400 UNIT capsule Take 400 Units by mouth daily.       All past medical history, surgical history, allergies, family history, immunizations andmedications were updated in the EMR today and reviewed under the history and medication portions of their EMR.     ROS: Negative, with the exception of above mentioned in HPI   Objective:  BP 117/79 (BP Location: Left  Arm, Patient Position: Sitting, Cuff Size: Normal)   Pulse 98   Temp 98.3 F (36.8 C) (Oral)   Resp 16   Ht 5\' 5"  (1.651 m)   Wt 144 lb (65.3 kg)   SpO2 90%   BMI 23.96 kg/m  Body mass index is 23.96 kg/m. Gen: Afebrile. No acute distress. Nontoxic in appearance, well developed, well nourished.  HENT: AT. [place]. Bilateral TM visualized without erythema or fullness. MMM, no oral lesions. Bilateral  nares mild erythema or swelling. Throat without erythema or exudates.  Moderate postnasal drip present.  Cough present.  Hoarseness present. Eyes:Pupils Equal Round Reactive to light, Extraocular movements intact,  Conjunctiva without redness, discharge or icterus. Neck/lymp/endocrine: Supple, no lymphadenopathy CV: RRR, no edema Chest: CTAB, no wheeze or crackles. Good air movement, normal resp effort.  Skin: no rashes, purpura or petechiae.  Neuro:  Normal gait-with cane. PERLA. EOMi. Alert. Oriented x3  Psych: Cooperative with exam.  Interacts with provider and answers questions appropriately.  Denies depression.  Irritated with discussion.  No evidence of SI or HI  No exam data present No results found. No results found for this or any previous visit (from the past 24 hour(s)).  Assessment/Plan: JERMANI EBERLEIN is a 85 y.o. male present for OV for  Cough/Bronchitis -Concern for pneumonia versus bronchitis.  ?  Just released from a SNF?[name]  Plus his history of cancer/chemo/immunosuppression.  His fingers are cold, making it difficult to pick up a good pulse ox reading, was able to get up to 90%.  Believe this is still reading low for him and not accurate.  He did not appear short of breath.  He was talking in full sentences without appearing distressed.  He really had very few complaints outside of his cough and hoarseness today.  With His demeanor, I am uncertain if he would complain even if he did not feel well.  Will order chest x-ray to rule out pneumonia.  Close follow-up with PCP will be  arranged once results received. -Discussed with his family members if he becomes short of breath, cough worsens, fevers, chills or cannot tolerate medications they need to take him to the emergency room today he can be provided medications and IV fluids.  They report understanding. - DG Chest 2 View; Future  Adjustment disorder with other symptom -Presented today for a cough to a provider that does not know his circumstances or situation.  I advised him to follow-up with his PCP within the next week in order to discuss their concerns for him.  This option would be the safest option for him and his care.  Patient is agreeable to that decision, his family does not seem as happy that I will not provide medication for this issue today.  If there is an emergent need prior to PCP appointment, which there is not today, they are to be seen in the emergency room.  Pilar Plate discussion with the patient and his desires will need to be achieved, as well as depression screenings etc. in order to start treatment.  Likely has at least some mild depression given his diagnosis/treatments and if family reports are accurate-I am not convinced he is receptive to thought of starting a medication. - Ambulatory referral to Psychiatry was placed today secondary to family's desire.  I agreed to place this in order to get the process started, since it will likely take some time to get him on a schedule. If after PCP discussion next week this is felt to not be necessary, it can be canceled at that time.   Reviewed expectations re: course of current medical issues.  Discussed self-management of symptoms.  Outlined signs and symptoms indicating need for more acute intervention.  Patient verbalized understanding and all questions were answered.  Patient received an After-Visit Summary.    No orders of the defined types were placed in this encounter.   > 25 minutes spent with patient, >50% of time spent face to face  Note  is dictated utilizing voice recognition software. Although note has been proof read prior to signing, occasional typographical errors still can be missed. If any questions arise, please do not hesitate to call for verification.   electronically signed by:  Howard Pouch, DO  [place]

## 2018-05-07 ENCOUNTER — Ambulatory Visit: Payer: Medicare Other | Admitting: Family Medicine

## 2018-05-07 ENCOUNTER — Telehealth: Payer: Self-pay | Admitting: *Deleted

## 2018-05-07 NOTE — Telephone Encounter (Signed)
Copied from [place] [id number]. Topic: Quick Communication - Home Health Verbal Orders >> May 07, 2018  2:14 PM Judyann Munson wrote: Caller/Agency: [facility]   Callback Number: [phone] Requesting OT/PT/Skilled Nursing/Social Work: OT  Frequency: 2x 3 weeks starting next week, home health aid 2x 3 weeks    Patient is expression some depression concern, he doesn't want to eat or drink or get out the bed.

## 2018-05-07 NOTE — Telephone Encounter (Signed)
Yes, ok with me 

## 2018-05-07 NOTE — Telephone Encounter (Signed)
Please advise. Thanks.  

## 2018-05-07 NOTE — Telephone Encounter (Signed)
Left detailed message on Radovan's vm.

## 2018-05-09 ENCOUNTER — Ambulatory Visit: Payer: Medicare Other | Admitting: Family Medicine

## 2018-05-10 ENCOUNTER — Encounter: Payer: Self-pay | Admitting: Family Medicine

## 2018-05-10 ENCOUNTER — Ambulatory Visit: Payer: Medicare Other | Admitting: Family Medicine

## 2018-05-10 ENCOUNTER — Ambulatory Visit (HOSPITAL_BASED_OUTPATIENT_CLINIC_OR_DEPARTMENT_OTHER)
Admission: RE | Admit: 2018-05-10 | Discharge: 2018-05-10 | Disposition: A | Discharge status: Home / Self Care | Payer: Medicare Other | Source: Ambulatory Visit | Attending: Family Medicine | Admitting: Family Medicine

## 2018-05-10 VITALS — BP 123/69 | HR 72 | Temp 97.7°F | Resp 16 | Ht 65.0 in | Wt 144.5 lb

## 2018-05-10 DIAGNOSIS — R05 Cough: Secondary | ICD-10-CM | POA: Insufficient documentation

## 2018-05-10 DIAGNOSIS — F331 Major depressive disorder, recurrent, moderate: Secondary | ICD-10-CM

## 2018-05-10 DIAGNOSIS — T451X5A Adverse effect of antineoplastic and immunosuppressive drugs, initial encounter: Secondary | ICD-10-CM

## 2018-05-10 DIAGNOSIS — R059 Cough, unspecified: Secondary | ICD-10-CM

## 2018-05-10 DIAGNOSIS — Z79899 Other long term (current) drug therapy: Secondary | ICD-10-CM | POA: Insufficient documentation

## 2018-05-10 DIAGNOSIS — J209 Acute bronchitis, unspecified: Secondary | ICD-10-CM | POA: Diagnosis not present

## 2018-05-10 DIAGNOSIS — D84821 Immunodeficiency due to drugs: Secondary | ICD-10-CM

## 2018-05-10 MED ORDER — ESCITALOPRAM OXALATE 5 MG PO TABS: 5.0000 mg | ORAL_TABLET | Freq: Every day | ORAL | 0 refills | Status: DC

## 2018-05-10 NOTE — Progress Notes (Signed)
OFFICE VISIT  05/10/2018   CC:  Chief Complaint  Patient presents with  . Follow-up    RCI, pt is not fasting.   . Depression    HPI:    Patient is a 85 y.o. Caucasian male who presents accompanied by his son for concern of depression.  No fevers, still with lots of coughing. No SOB, no hemoptysis, no chest pain.  Pt has had relative apathy and seemingly unmotivated existence for the last couple of years per his son's report today.  Started with the death of his daughter and has worsened the last 4 mo or so since he was dx'd with lymphoma.  Pt seems pretty stoic, won't admit that he really feels depressed or hopeless, but son reports that the whole family feels like pt behaves as if he is depressed.  Past Medical History:  Diagnosis Date  . C2 cervical fracture ([place]) 07/2017   Type II morphology with posterior displacement--C collar (potentially indefinitely).  . Chronic lower back pain   . Chronic renal insufficiency, stage III (moderate) (HCC)    GFR 50s-60s  . Diffuse large cell non-Hodgkin's lymphoma ([place]) 12/15/2017   B cell NHL involving bones only as of 01/2018.  RT 12/2017 helped with his pain; pt is to get hemo 01/2018.  PET 03/06/18: good response to tx.   . Goals of care, counseling/discussion 12/15/2017  . History of colon cancer 1993  . History of CVA (cerebrovascular accident)    Left MCA territory.  Carotid dopplers ok, Brain MRA ok, ECHO with grd I DD.  As of 02/2018->no ASA or plavix or statin due to pt getting chemo and RT + the related alteration in goals of care.  . Hypertension   . Lumbar spinal stenosis    Surgery 2012 helped but in 2015 pain has returned  . Lytic bone lesions on xray 08/2017   Skull, mildly abnormal bone scan. SPEP/UPEP w/out monoclonal spike.  Dr. Marin Olp eval 08/2017: plan CT C/A/P but not done as of 11/22/17.  ED visit s/p fall 11/17/17-->multiple lytic lesions now, L sacrum destroyed by tumor, lytic lesion in pelvis. Dr. Marin Olp has arranged  for bx, also plan for RT and XGeva.  . Metastasis to bone of unknown primary ([place]) 11/27/2017   Large B cell lymphoma (bone bx).  RT 12/2017, set to get chemo 01/2018.  . Osteoarthritis   . Venous insufficiency of both lower extremities    made worse by amlodipine    Past Surgical History:  Procedure Laterality Date  . CHOLECYSTECTOMY  1984  . INGUINAL HERNIA REPAIR  1974   Bilat  . LUMBAR SPINE SURGERY  2012   for spinal stenosis (Dr. Jonelle [place]  . LUNG LOBECTOMY  1993   LLL per pt--worry of possible cancer but turned out to be benign.  [name] PARTIAL COLECTOMY  1993   for colon cancer  . TRANSTHORACIC ECHOCARDIOGRAM  03/15/2016   Normal EF, grd I DD, mod dil aortic root    Outpatient Medications Prior to Visit  Medication Sig Dispense Refill  . amLODipine (NORVASC) 10 MG tablet Take 1 tablet (10 mg total) by mouth daily. 90 tablet 1  . atenolol (TENORMIN) 100 MG tablet Take 1 tablet (100 mg total) by mouth daily. 90 tablet 1  . cefdinir (OMNICEF) 300 MG capsule Take 1 capsule (300 mg total) by mouth 2 (two) times daily. 20 capsule 0  . cholecalciferol (VITAMIN D) 1000 UNITS tablet Take 1,000 Units by mouth daily.    [name]  ondansetron (ZOFRAN) 8 MG tablet Take 1 tablet (8 mg total) by mouth 2 (two) times daily as needed for refractory nausea / vomiting. Start on day 2 after bendamustine chemo 30 tablet 1  . oxyCODONE (OXY IR/ROXICODONE) 5 MG immediate release tablet 1-2 tabs po qid prn pain 84 tablet 0  . vitamin C (ASCORBIC ACID) 500 MG tablet Take 500 mg by mouth daily.    . vitamin E 400 UNIT capsule Take 400 Units by mouth daily.      No facility-administered medications prior to visit.     No Known Allergies  ROS As per HPI  PE: Blood pressure 123/69, pulse 72, temperature 97.7 F (36.5 C), temperature source Oral, resp. rate 16, height 5\' 5"  (1.651 m), weight 144 lb 8 oz (65.5 kg), SpO2 100 %. Gen: alert, not ill-appearing but is frail and elderly.  Oriented x 4. VS:  noted--normal. HEENT: eyes without injection, drainage, or swelling.  Ears: EACs clear, TMs with normal light reflex and landmarks.  Nose: Minimal clear rhinorrhea, with some dried, crusty exudate adherent to mildly injected mucosa.  No purulent d/c.  No paranasal sinus TTP.  No facial swelling.  Throat and mouth without focal lesion.  No pharyngial swelling, erythema, or exudate.   Neck: supple, no LAD.   LUNGS: CTA bilat, nonlabored resps.   CV: RRR, no m/r/g. EXT: no c/c/e SKIN: no rash  LABS:    Chemistry      Component Value Date/Time   NA 136 03/26/2018 0745   K 4.2 03/26/2018 0745   CL 103 03/26/2018 0745   CO2 26 03/26/2018 0745   BUN 16 03/26/2018 0745   CREATININE 1.01 03/26/2018 0745   CREATININE 1.22 (H) 08/03/2017 1518      Component Value Date/Time   CALCIUM 8.2 (L) 03/26/2018 0745   ALKPHOS 48 03/26/2018 0745   AST 18 03/26/2018 0745   ALT 12 03/26/2018 0745   BILITOT 0.5 03/26/2018 0745     Lab Results  Component Value Date   WBC 4.7 03/26/2018   HGB 10.5 (L) 03/26/2018   HCT 33.0 (L) 03/26/2018   MCV 93.8 03/26/2018   PLT 193 03/26/2018   Lab Results  Component Value Date   TSH 0.150 (L) 02/27/2018    IMPRESSION AND PLAN:  1) MDD vs normal elderly male behavior that is appropriate for his station in life. At any rate, he seems to be failing to thrive. Discussed these possibilities with pt's son today (pt included in discussion but he didn't really seem that concerned).  Son was very much in favor of psychiatrist referral vs starting pt on antidepressant. In the end we decided to start lexapro 5mg  qd. Therapeutic expectations and side effect profile of medication discussed today.  Patient's questions answered. Psychiatrist referral has already been made as of 05/02/2018 (Dr. Adele Schilder).  2) Acute bronchitis, minimal improvement on cefdinir for the last 8d.   Recheck CXR.  If no infiltrate then no further abx, as I strongly suspect this is a viral  infection. Mucinex DM q12 recommended for cough.  3) BP soft, pt not drinking fluids well (which is his baseline).  Stop amlodipine and continue to monitor bp.  Spent 45 min with pt today, with >50% of this time spent in counseling and care coordination regarding the above problems.  An After Visit Summary was printed and given to the patient.  FOLLOW UP: Return in about 3 weeks (around 05/31/2018) for f/u depression/FTT.  Signed:  Crissie Sickles,  MD           05/10/2018

## 2018-05-23 DIAGNOSIS — Z87891 Personal history of nicotine dependence: Secondary | ICD-10-CM | POA: Diagnosis not present

## 2018-05-23 DIAGNOSIS — I872 Venous insufficiency (chronic) (peripheral): Secondary | ICD-10-CM | POA: Diagnosis not present

## 2018-05-23 DIAGNOSIS — N183 Chronic kidney disease, stage 3 (moderate): Secondary | ICD-10-CM | POA: Diagnosis not present

## 2018-05-23 DIAGNOSIS — S12110D Anterior displaced Type II dens fracture, subsequent encounter for fracture with routine healing: Secondary | ICD-10-CM | POA: Diagnosis not present

## 2018-05-23 DIAGNOSIS — C8339 Diffuse large B-cell lymphoma, extranodal and solid organ sites: Secondary | ICD-10-CM | POA: Diagnosis not present

## 2018-05-23 DIAGNOSIS — M1990 Unspecified osteoarthritis, unspecified site: Secondary | ICD-10-CM | POA: Diagnosis not present

## 2018-05-23 DIAGNOSIS — I129 Hypertensive chronic kidney disease with stage 1 through stage 4 chronic kidney disease, or unspecified chronic kidney disease: Secondary | ICD-10-CM | POA: Diagnosis not present

## 2018-05-23 DIAGNOSIS — M48061 Spinal stenosis, lumbar region without neurogenic claudication: Secondary | ICD-10-CM | POA: Diagnosis not present

## 2018-05-24 ENCOUNTER — Telehealth: Payer: Self-pay | Admitting: Family Medicine

## 2018-05-24 NOTE — Telephone Encounter (Signed)
Copied from [place] [id number]. Topic: Quick Communication - Home Health Verbal Orders >> May 24, 2018  8:28 AM [name] E wrote: Caller/Agency: [facility] Number: [phone]  Pt was suppose to have a visit today from OT but wife told OT that there are too many therapist coming in the home and it is too much on the PT. Caregiver wants to cancel OT for now and just have Pt continue / please advise

## 2018-05-24 NOTE — Telephone Encounter (Signed)
SW Dr. Anitra Lauth, okay to cancel order for OT.  Left detailed message on Radovan's vm.

## 2018-05-31 ENCOUNTER — Telehealth: Payer: Self-pay | Admitting: Family Medicine

## 2018-05-31 ENCOUNTER — Ambulatory Visit (INDEPENDENT_AMBULATORY_CARE_PROVIDER_SITE_OTHER): Payer: Medicare Other | Admitting: Family Medicine

## 2018-05-31 ENCOUNTER — Encounter: Payer: Self-pay | Admitting: Family Medicine

## 2018-05-31 VITALS — BP 170/93 | HR 57 | Temp 98.1°F | Resp 16 | Ht 65.0 in | Wt 142.5 lb

## 2018-05-31 DIAGNOSIS — K6289 Other specified diseases of anus and rectum: Secondary | ICD-10-CM | POA: Diagnosis not present

## 2018-05-31 DIAGNOSIS — S12100S Unspecified displaced fracture of second cervical vertebra, sequela: Secondary | ICD-10-CM

## 2018-05-31 DIAGNOSIS — R159 Full incontinence of feces: Secondary | ICD-10-CM

## 2018-05-31 DIAGNOSIS — C8330 Diffuse large B-cell lymphoma, unspecified site: Secondary | ICD-10-CM | POA: Diagnosis not present

## 2018-05-31 DIAGNOSIS — F329 Major depressive disorder, single episode, unspecified: Secondary | ICD-10-CM

## 2018-05-31 DIAGNOSIS — R197 Diarrhea, unspecified: Secondary | ICD-10-CM

## 2018-05-31 DIAGNOSIS — C8590 Non-Hodgkin lymphoma, unspecified, unspecified site: Secondary | ICD-10-CM

## 2018-05-31 DIAGNOSIS — F32A Depression, unspecified: Secondary | ICD-10-CM

## 2018-05-31 MED ORDER — ESCITALOPRAM OXALATE 10 MG PO TABS: 10.0000 mg | ORAL_TABLET | Freq: Every day | ORAL | 1 refills | Status: DC

## 2018-05-31 NOTE — Telephone Encounter (Signed)
Pls call pt's son: I have ordered an MRI of brain and neck to be done at [facility]. He'll need to come back for lab visit in the next 1-2d to get an updated kidney function test. He'll get contacted about scheduling the MRI. -thx

## 2018-05-31 NOTE — Progress Notes (Signed)
OFFICE VISIT  05/31/2018   CC:  Chief Complaint  Patient presents with  . Follow-up    depression and FTT   HPI:    Patient is a 85 y.o. Caucasian male who presents for 3 week f/u recurrent MDD.  Pt's primary sx's up to last visit were generalized apathy, poor motivation, "acting depressed" per pt's son. Pt denies feeling depressed mood.  After much discussion last visit, we decided to do a trial of antidepressant with lexapro 5mg  qd. Additionally, at that time his PO intake had not been very good and bp was on low end of normal.  We stopped his amlodipine at that time.  He is currently undergoing treatment for NHL.  MOOD: no change.  He does not think he is depressed.  Son in room and states that pt is acting less depressed since getting on lexapro. No adverse effects from med has been noted.  BP: bp high here today up to 170s/90s.  No checks at home.  Diarrhea +fecal incontinence: wife states that pt has noted this for approx 6 mo but she can't be very confident about this estimate.  He had an odontoid fracture about 10 months ago, and she knows that the problem started sometime after that but she is not sure how long after  He has frequent loose BMs and often does not have a warning that one is coming, leaks stool a lot, sometimes looses large volume of stool.  He says that sometimes he does feel like he has to have a BM and he makes it to the toilet to allow evacuation of large amount of loose stool.  No melena or BRBPR. He denies abd pain, n/v, or low back pain.  No pelvic pain.  NO focal weakness in LL's.  NO saddle anesthesia. He has chronic urine dripping but he can sense a full bladder and empty relatively well.   Appetite is chronically poor.  He has had no radiating buttocks or leg pains or paresthesias. He has hx of RT to pelvis for bony lesion of lymphoma (diffuse large B cell lymphoma) and he has had chemo (rituxan/bendamustine) for 6 cycles, the last one approx 1 mo ago. He  also has a hx of lumbar DDD and has had surgery in the remote past for spinal stenosis. Has hx of closed, mildly displaced odontoid fx about 7 mo ago, stable on f/u imaging by neurosurgery.  He is now out of a cervical collar.    Past Medical History:  Diagnosis Date  . C2 cervical fracture ([place]) 07/2017   Type II morphology with posterior displacement--C collar (potentially indefinitely).  . Chronic lower back pain   . Chronic renal insufficiency, stage III (moderate) (HCC)    GFR 50s-60s  . Diffuse large cell non-Hodgkin's lymphoma ([place]) 12/15/2017   B cell NHL involving bones only as of 01/2018.  RT 12/2017 helped with his pain; pt is to get hemo 01/2018.  PET 03/06/18: good response to tx.   . Goals of care, counseling/discussion 12/15/2017  . History of colon cancer 1993  . History of CVA (cerebrovascular accident)    Left MCA territory.  Carotid dopplers ok, Brain MRA ok, ECHO with grd I DD.  As of 02/2018->no ASA or plavix or statin due to pt getting chemo and RT + the related alteration in goals of care.  . Hypertension   . Lumbar spinal stenosis    Surgery 2012 helped but in 2015 pain has returned  . Lytic bone  lesions on xray 08/2017   Skull, mildly abnormal bone scan. SPEP/UPEP w/out monoclonal spike.  Dr. Marin Olp eval 08/2017: plan CT C/A/P but not done as of 11/22/17.  ED visit s/p fall 11/17/17-->multiple lytic lesions now, L sacrum destroyed by tumor, lytic lesion in pelvis. Dr. Marin Olp has arranged for bx, also plan for RT and XGeva.  . Metastasis to bone of unknown primary ([place]) 11/27/2017   Large B cell lymphoma (bone bx).  RT 12/2017, set to get chemo 01/2018.  . Osteoarthritis   . Venous insufficiency of both lower extremities    made worse by amlodipine    Past Surgical History:  Procedure Laterality Date  . CHOLECYSTECTOMY  1984  . INGUINAL HERNIA REPAIR  1974   Bilat  . LUMBAR SPINE SURGERY  2012   for spinal stenosis (Dr. Jonelle Sports in [place]  . LUNG LOBECTOMY  1993    LLL per pt--worry of possible cancer but turned out to be benign.  [name] PARTIAL COLECTOMY  1993   for colon cancer  . TRANSTHORACIC ECHOCARDIOGRAM  03/15/2016   Normal EF, grd I DD, mod dil aortic root    Outpatient Medications Prior to Visit  Medication Sig Dispense Refill  . atenolol (TENORMIN) 100 MG tablet Take 1 tablet (100 mg total) by mouth daily. 90 tablet 1  . cholecalciferol (VITAMIN D) 1000 UNITS tablet Take 1,000 Units by mouth daily.    [name] oxyCODONE (OXY IR/ROXICODONE) 5 MG immediate release tablet 1-2 tabs po qid prn pain 84 tablet 0  . vitamin C (ASCORBIC ACID) 500 MG tablet Take 500 mg by mouth daily.    . vitamin E 400 UNIT capsule Take 400 Units by mouth daily.     [name] escitalopram (LEXAPRO) 5 MG tablet Take 1 tablet (5 mg total) by mouth daily. 30 tablet 0  . amLODipine (NORVASC) 10 MG tablet Take 1 tablet (10 mg total) by mouth daily. (Patient not taking: Reported on 05/31/2018) 90 tablet 1  . cefdinir (OMNICEF) 300 MG capsule Take 1 capsule (300 mg total) by mouth 2 (two) times daily. (Patient not taking: Reported on 05/31/2018) 20 capsule 0  . ondansetron (ZOFRAN) 8 MG tablet Take 1 tablet (8 mg total) by mouth 2 (two) times daily as needed for refractory nausea / vomiting. Start on day 2 after bendamustine chemo (Patient not taking: Reported on 05/31/2018) 30 tablet 1   No facility-administered medications prior to visit.     No Known Allergies  ROS As per HPI  PE: Blood pressure (!) 170/93, pulse (!) 57, temperature 98.1 F (36.7 C), temperature source Oral, resp. rate 16, height 5\' 5"  (1.651 m), weight 142 lb 8 oz (64.6 kg), SpO2 100 %. Gen: Alert, well appearing.  Patient is oriented to person, place, time, and situation. AFFECT: flat. Abd: soft, NT/ND, BS hyperactive.  No bruit, HSM, or mass. Anal: some skin tags circumferentially arranged around anal opening.  He has almost NO rectal tone.  Some wet fecal matter is seeping out and is on his adult diaper.   LABS:     Chemistry      Component Value Date/Time   NA 136 03/26/2018 0745   K 4.2 03/26/2018 0745   CL 103 03/26/2018 0745   CO2 26 03/26/2018 0745   BUN 16 03/26/2018 0745   CREATININE 1.01 03/26/2018 0745   CREATININE 1.22 (H) 08/03/2017 1518      Component Value Date/Time   CALCIUM 8.2 (L) 03/26/2018 0745   ALKPHOS 48 03/26/2018 0745  AST 18 03/26/2018 0745   ALT 12 03/26/2018 0745   BILITOT 0.5 03/26/2018 0745     Lab Results  Component Value Date   WBC 4.7 03/26/2018   HGB 10.5 (L) 03/26/2018   HCT 33.0 (L) 03/26/2018   MCV 93.8 03/26/2018   PLT 193 03/26/2018    IMPRESSION AND PLAN:  1) Depression: this dx is debatable, but he seems overall a little improved on lexapro and has no adverse effects from the med so far.  Increase lexapro to 10mg  daily.  2) Fecal incontinence, loose stool: family is pressing to investigate this. 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  This seems to be affecting his anal sphincter tone >bladder.   Family anxious that a possible new lesion from his lymphoma or a complication from his odontoid fracture may be causing this.  I decided to go ahead and pursue imaging.  Will get MRI w w/out contrast of brain and C spine IF his GFR is >60 ml/min. Will also check stool studies to eval for infectious causes of his diarrhea (other than viral infection).    3) Diffuse large B cell lymphoma: he is s/p chemo + some irradiation to a destructive sacral lesion. Dr. Marin Olp has been very pleased with pt's response to treatment.  4) Odontoid fracture 10 mo ago: stability of this was noted on serial CT scans by neurosurgery, the most recent being 11/17/2017. He is out of cervical collar and w/out pain.  Spent 40 min with pt today, with >50% of this time spent in counseling and care coordination regarding the above problems.  An After Visit Summary was printed and given to  the patient.  FOLLOW UP: Return in about 4 weeks (around 06/28/2018) for f/u mood/diarrhea.  Signed:  Crissie Sickles, MD           05/31/2018

## 2018-05-31 NOTE — Telephone Encounter (Signed)
Pts son advised and voiced understanding, okay per DPR.   Lab apt made for 06/04/18 at 11:00am.

## 2018-06-04 ENCOUNTER — Other Ambulatory Visit: Payer: Self-pay

## 2018-06-08 DIAGNOSIS — S12110D Anterior displaced Type II dens fracture, subsequent encounter for fracture with routine healing: Secondary | ICD-10-CM | POA: Diagnosis not present

## 2018-06-08 DIAGNOSIS — N183 Chronic kidney disease, stage 3 (moderate): Secondary | ICD-10-CM | POA: Diagnosis not present

## 2018-06-08 DIAGNOSIS — M1990 Unspecified osteoarthritis, unspecified site: Secondary | ICD-10-CM | POA: Diagnosis not present

## 2018-06-08 DIAGNOSIS — Z87891 Personal history of nicotine dependence: Secondary | ICD-10-CM | POA: Diagnosis not present

## 2018-06-08 DIAGNOSIS — C8339 Diffuse large B-cell lymphoma, extranodal and solid organ sites: Secondary | ICD-10-CM | POA: Diagnosis not present

## 2018-06-08 DIAGNOSIS — I129 Hypertensive chronic kidney disease with stage 1 through stage 4 chronic kidney disease, or unspecified chronic kidney disease: Secondary | ICD-10-CM | POA: Diagnosis not present

## 2018-06-08 DIAGNOSIS — I872 Venous insufficiency (chronic) (peripheral): Secondary | ICD-10-CM | POA: Diagnosis not present

## 2018-06-08 DIAGNOSIS — M48061 Spinal stenosis, lumbar region without neurogenic claudication: Secondary | ICD-10-CM | POA: Diagnosis not present

## 2018-06-09 ENCOUNTER — Ambulatory Visit (HOSPITAL_BASED_OUTPATIENT_CLINIC_OR_DEPARTMENT_OTHER): Payer: Medicare Other

## 2018-06-13 ENCOUNTER — Ambulatory Visit: Payer: Self-pay

## 2018-06-13 NOTE — Telephone Encounter (Signed)
Returned pt's call. "Spoke with [place] with [facility] she was out seeing the pt and he has a low grade fever not feeling well per wife", was the note left by the agent. Called pt and temperature was "99 something." Pt denies any symptoms except "feeling lazy." Pt stated he slept well and denies cold symptoms, headache, sore throat, cancer chemotherapy (last chemo was 2 years ago), cough, rash, vomiting and abdominal pain. Pt stated he had been having loose stools, but stated that the stools are getting better.  Home care advice given and pt verbalized understanding.  Reason for Disposition . [1] Fever AND [2] no signs of serious infection or localizing symptoms (all other triage questions negative)  Answer Assessment - Initial Assessment Questions 1. TEMPERATURE: "What is the most recent temperature?"  "How was it measured?"      99.? 2. ONSET: "When did the fever start?"      today 3. SYMPTOMS: "Do you have any other symptoms besides the fever?"  (e.g., colds, headache, sore throat, earache, cough, rash, diarrhea, vomiting, abdominal pain)     Loose stools bur per pt has gotten "feeling lazy" 4. CAUSE: If there are no symptoms, ask: "What do you think is causing the fever?"      Pt doesn't know 5. CONTACTS: "Does anyone else in the family have an infection?"     no 6. TREATMENT: "What have you done so far to treat this fever?" (e.g., medications)     no 7. IMMUNOCOMPROMISE: "Do you have of the following: diabetes, HIV positive, splenectomy, cancer chemotherapy, chronic steroid treatment, transplant patient, etc."     no 8. PREGNANCY: "Is there any chance you are pregnant?" "When was your last menstrual period?"     n/a 9. TRAVEL: "Have you traveled out of the country in the last month?" (e.g., travel history, exposures)     no  Protocols used: FEVER-A-AH

## 2018-06-14 ENCOUNTER — Other Ambulatory Visit (INDEPENDENT_AMBULATORY_CARE_PROVIDER_SITE_OTHER): Payer: Medicare Other

## 2018-06-14 DIAGNOSIS — S12100S Unspecified displaced fracture of second cervical vertebra, sequela: Secondary | ICD-10-CM

## 2018-06-14 DIAGNOSIS — R159 Full incontinence of feces: Secondary | ICD-10-CM | POA: Diagnosis not present

## 2018-06-14 DIAGNOSIS — C8590 Non-Hodgkin lymphoma, unspecified, unspecified site: Secondary | ICD-10-CM

## 2018-06-14 DIAGNOSIS — K6289 Other specified diseases of anus and rectum: Secondary | ICD-10-CM

## 2018-06-14 LAB — BASIC METABOLIC PANEL
BUN: 14 mg/dL (ref 6–23)
CO2: 30 mEq/L (ref 19–32)
Calcium: 8.7 mg/dL (ref 8.4–10.5)
Chloride: 99 mEq/L (ref 96–112)
Creatinine, Ser: 0.89 mg/dL (ref 0.40–1.50)
GFR: 79.11 mL/min (ref >60.00)
Glucose, Bld: 122 mg/dL — ABNORMAL HIGH (ref 70–99)
POTASSIUM: 4.4 meq/L (ref 3.5–5.1)
SODIUM: 135 meq/L (ref 135–145)

## 2018-06-20 ENCOUNTER — Ambulatory Visit (HOSPITAL_COMMUNITY): Payer: Self-pay | Admitting: Psychiatry

## 2018-06-26 ENCOUNTER — Other Ambulatory Visit: Payer: Self-pay | Admitting: Family Medicine

## 2018-07-17 ENCOUNTER — Ambulatory Visit (HOSPITAL_COMMUNITY): Payer: Self-pay | Admitting: Psychiatry

## 2018-08-06 ENCOUNTER — Other Ambulatory Visit: Payer: Self-pay | Admitting: Family Medicine

## 2018-08-06 MED ORDER — ATENOLOL 100 MG PO TABS: 100.0000 mg | ORAL_TABLET | Freq: Every day | ORAL | 1 refills | Status: DC

## 2018-08-06 MED ORDER — ESCITALOPRAM OXALATE 10 MG PO TABS: 10.0000 mg | ORAL_TABLET | Freq: Every day | ORAL | 1 refills | Status: DC

## 2018-08-06 NOTE — Telephone Encounter (Signed)
Patient wife called regarding 2 of patients meds. She was not sure if patient needs to continue taking. Please advise. Patient is out of meds. [place]  Lexapro Atenolol   Thank you Hinton Dyer

## 2018-08-06 NOTE — Telephone Encounter (Signed)
RF request for lexaprol and atenolol. Patient didn't follow up in 4 weeks after last appt 05/31/2018, please advise if you would like him to be seen virtually or in office or if we can send in RX?

## 2018-08-06 NOTE — Telephone Encounter (Signed)
RF request for Atenolol LOV: 05/31/18, F/U dep/FTT Next ov: none Last written: 01/12/18 #90 w/1 RF  RF request for Lexapro LOV:05/31/18 Next ov: none Last written: 05/31/18 #30 w/1 RF  Was advised to f/u 4 weeks. Please advise, thanks.  Pt uses [facility]. Medication pending.

## 2018-08-06 NOTE — Telephone Encounter (Signed)
Requested Prescriptions  Pending Prescriptions Disp Refills  . escitalopram (LEXAPRO) 10 MG tablet [Pharmacy Med Name: escitalopram 10 mg tablet] 90 tablet 1    Sig: TAKE ONE TABLET BY MOUTH EVERY DAY     Psychiatry:  Antidepressants - SSRI Passed - 08/06/2018  3:19 PM      Passed - Valid encounter within last 6 months    Recent Outpatient Visits          2 months ago Frequent fecal incontinence   [place], Adrian Blackwater, MD   2 months ago MDD (major depressive disorder), recurrent episode, moderate ([place])   [place], Adrian Blackwater, MD   3 months ago [place], Renee A, DO   4 months ago Fever, unspecified fever cause   [place], Adrian Blackwater, MD   5 months ago Bacteremia   [place], Adrian Blackwater, MD           . atenolol (TENORMIN) 100 MG tablet [Pharmacy Med Name: atenolol 100 mg tablet] 90 tablet 1    Sig: TAKE ONE TABLET BY MOUTH EVERY DAY     Cardiovascular:  Beta Blockers Failed - 08/06/2018  3:19 PM      Failed - Last BP in normal range    BP Readings from Last 1 Encounters:  05/31/18 (!) 170/93         Passed - Last Heart Rate in normal range    Pulse Readings from Last 1 Encounters:  05/31/18 (!) 57         Passed - Valid encounter within last 6 months    Recent Outpatient Visits          2 months ago Frequent fecal incontinence   [place], Adrian Blackwater, MD   2 months ago MDD (major depressive disorder), recurrent episode, moderate ([place])   [place] McG[place]en, Adrian Blackwater, MD   3 months ago [place], Renee A, DO   4 months ago Fever, unspecified fever cause   [place], Adrian Blackwater, MD   5 months ago Bacteremia   [facility] [place], Adrian Blackwater, MD

## 2018-08-10 NOTE — Telephone Encounter (Signed)
Patient advised and voiced understanding.  

## 2018-08-31 DIAGNOSIS — L821 Other seborrheic keratosis: Secondary | ICD-10-CM | POA: Diagnosis not present

## 2018-08-31 DIAGNOSIS — D485 Neoplasm of uncertain behavior of skin: Secondary | ICD-10-CM | POA: Diagnosis not present

## 2018-08-31 DIAGNOSIS — L989 Disorder of the skin and subcutaneous tissue, unspecified: Secondary | ICD-10-CM | POA: Diagnosis not present

## 2018-08-31 DIAGNOSIS — L570 Actinic keratosis: Secondary | ICD-10-CM | POA: Diagnosis not present

## 2018-08-31 DIAGNOSIS — D692 Other nonthrombocytopenic purpura: Secondary | ICD-10-CM | POA: Diagnosis not present

## 2018-09-26 DIAGNOSIS — Z961 Presence of intraocular lens: Secondary | ICD-10-CM | POA: Diagnosis not present

## 2018-09-26 DIAGNOSIS — H43813 Vitreous degeneration, bilateral: Secondary | ICD-10-CM | POA: Diagnosis not present

## 2018-09-26 DIAGNOSIS — H04123 Dry eye syndrome of bilateral lacrimal glands: Secondary | ICD-10-CM | POA: Diagnosis not present

## 2018-09-26 DIAGNOSIS — H35373 Puckering of macula, bilateral: Secondary | ICD-10-CM | POA: Diagnosis not present

## 2019-02-13 IMAGING — CT CT MAXILLOFACIAL W/O CM
3 of 8 series · 14 of 47 positions shown, 17 images · non-contrast
Comparison: Head CT 11/17/2017

CLINICAL DATA: Facial swelling. Undergoing treatment for diffuse
large B-cell lymphoma.

EXAM:
CT HEAD WITHOUT CONTRAST
CT MAXILLOFACIAL WITHOUT CONTRAST
TECHNIQUE: Multidetector CT imaging of the head and maxillofacial structures
were performed using the standard protocol without intravenous
contrast. Multiplanar CT image reconstructions of the maxillofacial
structures were also generated.

[Series 3: head bone · axial · 0.41mm/px · z∈[+1010,+1142]mm · 10 of 82 slices shown, 13 images]
[im 8/82  brain]
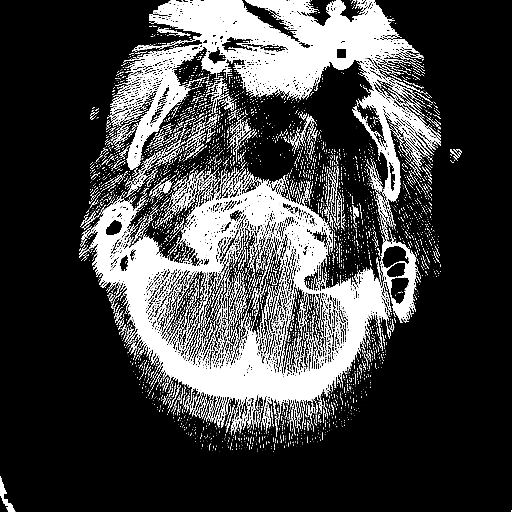
[im 8/82  bone]
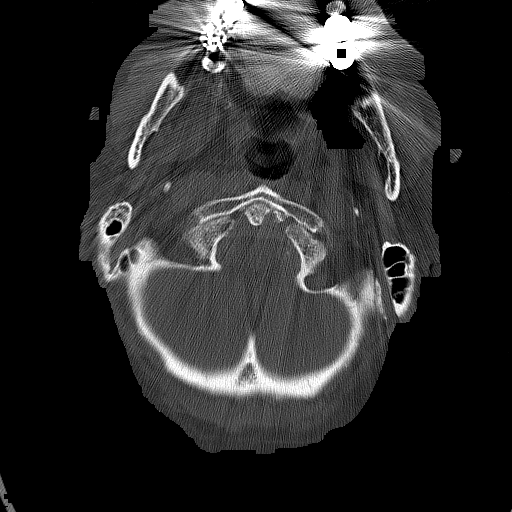
[im 15/82  bone]
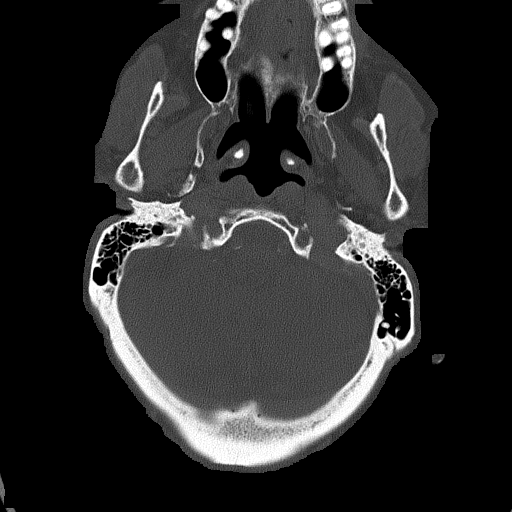
[im 23/82  bone]
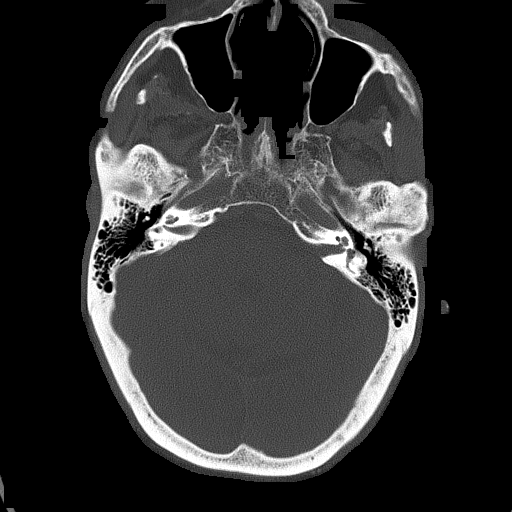
[im 30/82  bone]
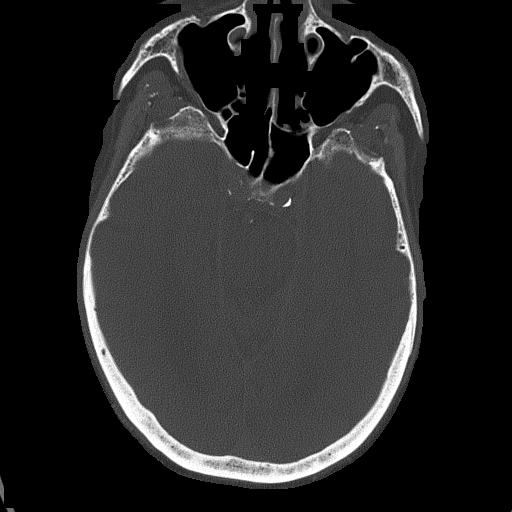
[im 37/82  brain]
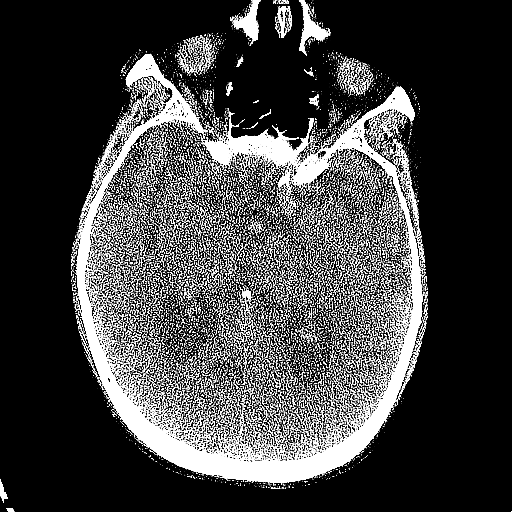
[im 37/82  bone]
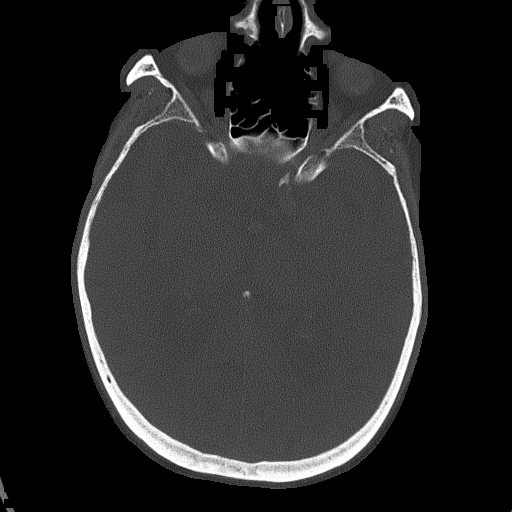
[im 45/82  bone]
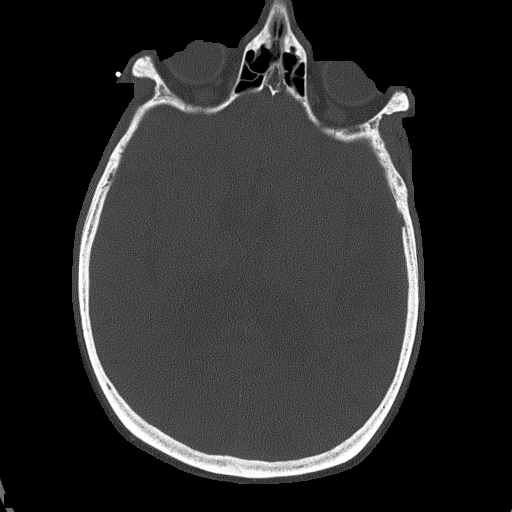
[im 52/82  bone]
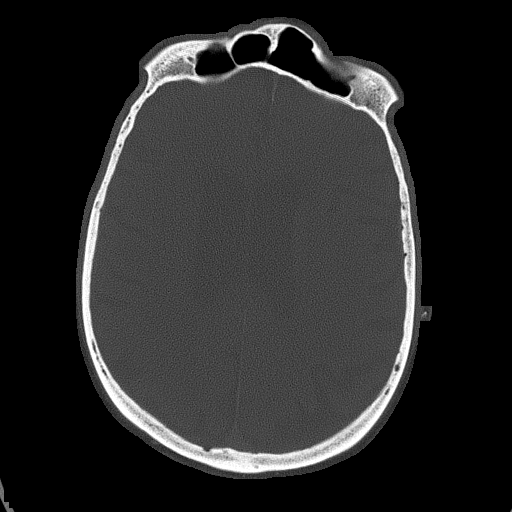
[im 59/82  bone]
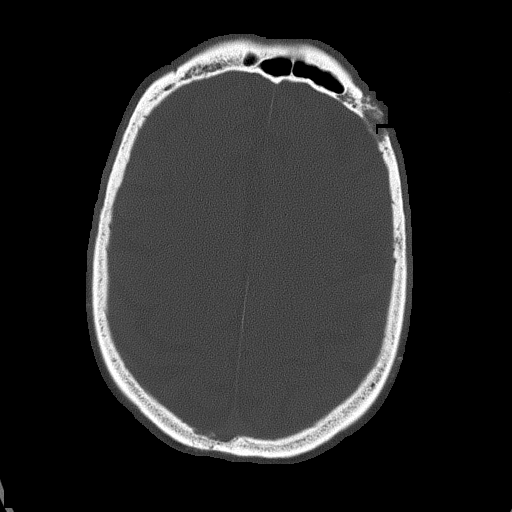
[im 67/82  brain]
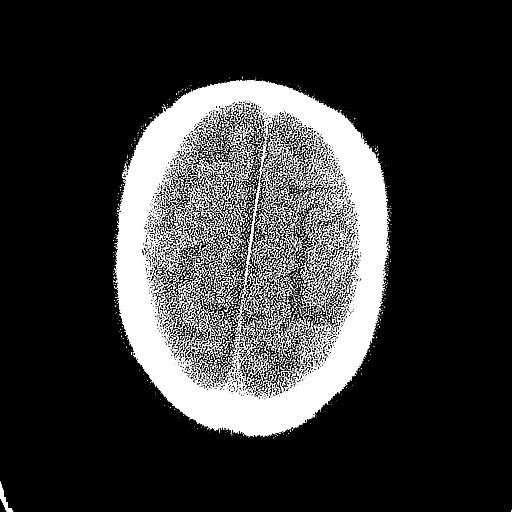
[im 67/82  bone]
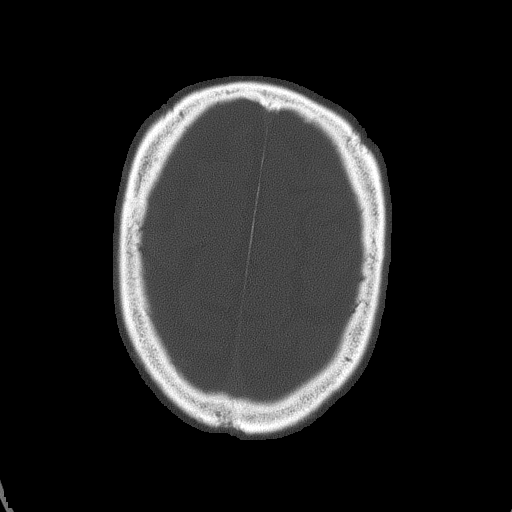
[im 74/82  bone]
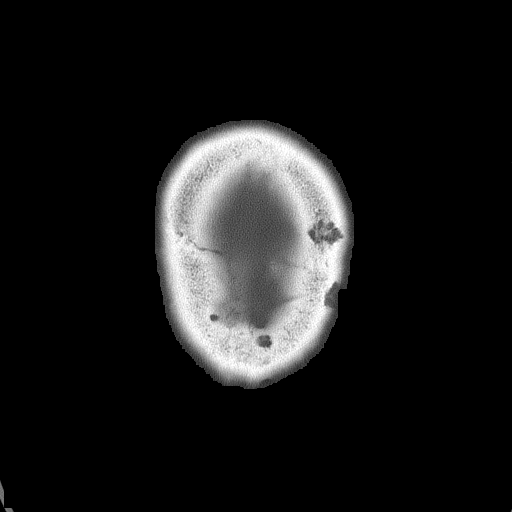

[Series 4: cor head wo · coronal · 0.32mm/px · 2 of 70 slices shown]
[im 24/70  bone]
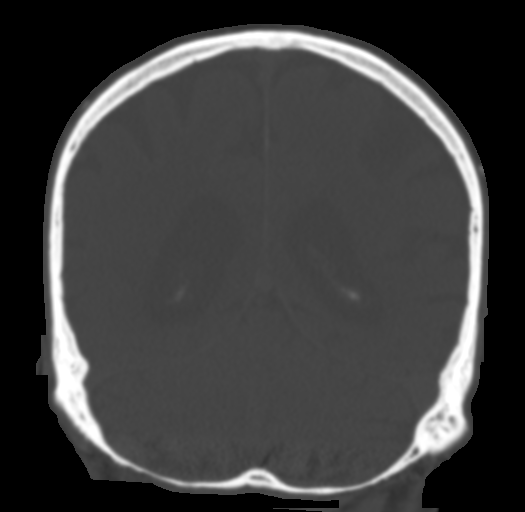
[im 47/70  bone]
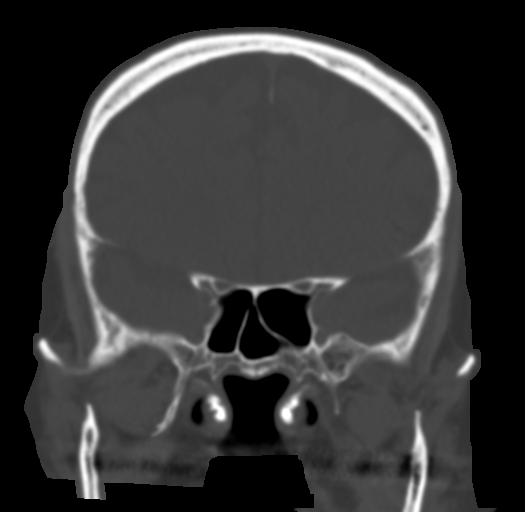

[Series 11: sagittal soft · sagittal · 0.34mm/px · 2 of 81 slices shown]
[im 27/81  bone]
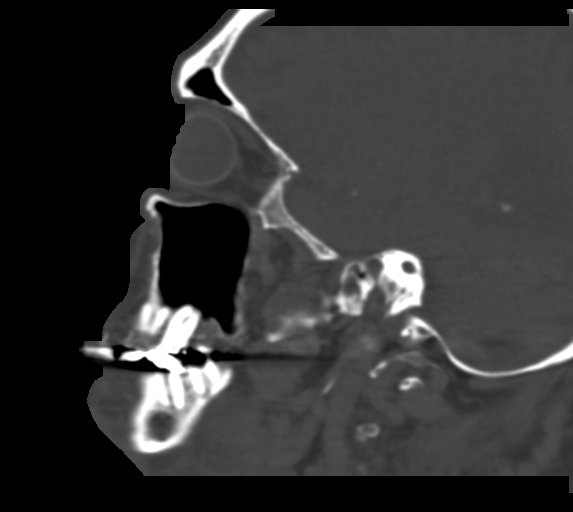
[im 54/81  bone]
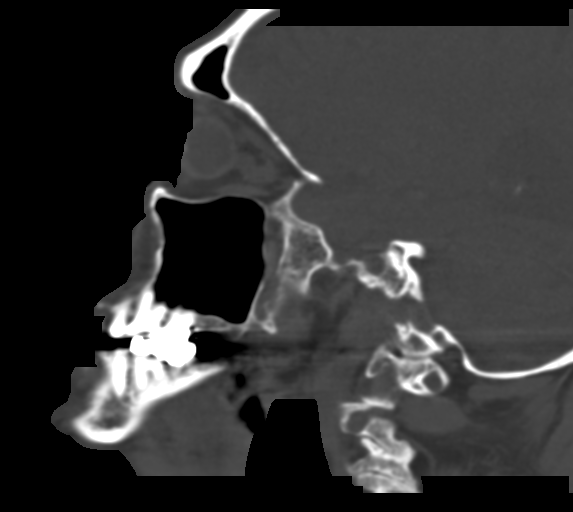

[14 of 47 positions shown; findings below may reference images not displayed]

FINDINGS: CT HEAD FINDINGS

Brain: There is no evidence for acute hemorrhage, hydrocephalus,
mass lesion, or abnormal extra-axial fluid collection. No definite
CT evidence for acute infarction. Diffuse loss of parenchymal volume
is consistent with atrophy. Patchy low attenuation in the deep
hemispheric and periventricular white matter is nonspecific, but
likely reflects chronic microvascular ischemic demyelination.

Vascular: No hyperdense vessel or unexpected calcification.

Skull: The patient's known multiple lytic skull lesions show
evidence of interval progression with posterior midline lesion
measuring 2.4 cm in long dimension today compared to 2.1 cm
previously.

Other: None.

CT MAXILLOFACIAL FINDINGS

Osseous: No fracture or mandibular dislocation. Advanced
degenerative changes are noted in the TMJ bilaterally.. Chronic type
2 dens fracture as evaluated on cervical spine CT of 10/09/2017.

Orbits: Unremarkable.

Sinuses: Paranasal sinuses and mastoid air cells are clear.

Soft tissues: There is edema and stranding in the lower jaw, right
greater than left extending under the chin. This is been
incompletely visualized on the maxillofacial CT. Superinfection of
the fluid cannot be excluded and assessment is limited by lack of
intravenous contrast material.
IMPRESSION: 1. No acute intracranial abnormality. Atrophy with chronic small
vessel white matter ischemic disease.
2. Multiple lytic skull lesions as seen previously, slightly
progressed in the interval.
3. Chronic type 2 dens fracture as better characterized on cervical
spine CT of 10/09/2017. Alignment appears similar today.
4. Edema and stranding in the soft tissues of the lower jaw, right
greater than left and extending down under the chin and posteriorly
up medial to the right mandibular angle. These changes have been
incompletely visualized on the maxillofacial CT. No definite dental
source of these changes although such cannot be completely excluded
on this study. CT imaging with intravenous contrast, if possible
would likely prove helpful to further assess. Consider performing
maxillofacial/neck CT as the abnormal soft tissue attenuation tracks
into the anterior neck.

## 2019-02-18 ENCOUNTER — Other Ambulatory Visit: Payer: Self-pay

## 2019-02-18 ENCOUNTER — Encounter: Payer: Self-pay | Admitting: Family Medicine

## 2019-02-18 ENCOUNTER — Ambulatory Visit: Payer: Medicare Other

## 2019-02-18 ENCOUNTER — Ambulatory Visit (INDEPENDENT_AMBULATORY_CARE_PROVIDER_SITE_OTHER): Payer: Medicare Other | Admitting: Family Medicine

## 2019-02-18 VITALS — BP 153/97 | HR 76 | Temp 98.7°F | Resp 16 | Ht 65.0 in | Wt 161.8 lb

## 2019-02-18 DIAGNOSIS — I10 Essential (primary) hypertension: Secondary | ICD-10-CM

## 2019-02-18 DIAGNOSIS — Z0000 Encounter for general adult medical examination without abnormal findings: Secondary | ICD-10-CM

## 2019-02-18 NOTE — Progress Notes (Signed)
The patient is here for annual Medicare wellness examination and management of other chronic and acute problems. Other problems discussed today: HTN, no home monitoring but he is compliant with medication. He is in no pain, takes no pain meds.   AWV DATA The risk factors are reflected in the social history.  The roster of all physicians providing medical care to patient is listed in the Snapshot section of the chart.  Activities of daily living:  Pt's current living situation: [facility] retirement home. The patient is 100% independent in all ADLs: dressing, toileting, feeding as well as independent mobility.  Home safety : The patient has smoke detectors in the home. They wear seatbelts. There is no violence in the home.   There is no risks for hepatitis, STDs or HIV. There is no history of blood transfusion. They have no travel history to infectious disease endemic areas of the world.  The patient has seen their dentist in the last six month. They have seen their eye doctor in the last year. They admit to hearing difficulty and have not had audiologic testing in the last year.  They do not  have excessive sun exposure. Discussed the need for sun protection: hats, long sleeves and use of sunscreen if there is significant sun exposure.   Diet: the importance of a healthy diet is discussed. They do have a healthy diet.  The patient has a regular exercise program: NONE per week.  He ambulates in home, otherwise nothing regular. The benefits of regular aerobic exercise were discussed.  Depression screen: there are no signs or vegative symptoms of depression- irritability, change in appetite, anhedonia, sadness/tearfullness.none Depression screen [facility] 2/9 02/18/2019  Decreased Interest 0  Down, Depressed, Hopeless 0  PHQ - 2 Score 0  Altered sleeping 0  Tired, decreased energy 0  Change in appetite 0  Feeling bad or failure about yourself  0  Trouble concentrating 0  Moving slowly or  fidgety/restless 0  Suicidal thoughts 0  PHQ-9 Score 0  Difficult doing work/chores Not difficult at all   Fall Risk  02/18/2019 12/05/2017 07/26/2017 10/06/2015 10/06/2015  Falls in the past year? 0 Yes Yes No No  Number falls in past yr: 0 2 or more 1 - -  Injury with Fall? 0 Yes Yes - -  Risk Factor Category  - High Fall Risk - - -  Risk for fall due to : - History of fall(s);Impaired balance/gait;Impaired mobility - - -  Follow up Falls evaluation completed - Falls evaluation completed - -     Cognitive assessment: the patient manages all their financial and personal affairs and is actively engaged. They could relate day,date,year and events; recalled 3/3 objects at 3 minutes; performed clock-face test normally.  Reviewed advance directives with pt today.  They have a living will.  The following portions of the patient's history were reviewed and updated as appropriate: allergies, current medications, past family history, past medical history,  past surgical history, past social history  and problem list.  Vision, hearing, body mass index were assessed and reviewed.  Hearing is impaired. No change recently.  Initial bp today 153/97.  Recheck manual bp at end of visit 128/82 BP (!) 153/97 (BP Location: Left Arm, Patient Position: Sitting, Cuff Size: Normal)   Pulse 76   Temp 98.7 F (37.1 C) (Temporal)   Resp 16   Ht 5' 5"  (1.651 m)   Wt 161 lb 12.8 oz (73.4 kg)   SpO2 96%  BMI 26.92 kg/m  EXAM: Gen: Alert, well appearing.  Patient is oriented to person, place, time, and situation. AFFECT: pleasant, lucid thought and speech. PIR:JJOA: no injection, icteris, swelling, or exudate.  EOMI, PERRLA. Mouth: lips without lesion/swelling.  Oral mucosa pink and moist. Oropharynx without erythema, exudate, or swelling.  CV: RRR, no m/r/g.   LUNGS: CTA bilat, nonlabored resps, good aeration in all lung fields. ABD: soft, NT/ND no organomegaly EXT: no clubbing or cyanosis.  1+ pitting  edema of L LL.    During the course of the visit the patient was educated and counseled about appropriate screening and preventive services including :  Annual wellness visit : today. diabetes screening: multiple mildly elevated glucoses on Met panel checks this year but none known to be done in fasting state->pt declines today. colorectal cancer screening: no further screening is indicated. recommended immunizations (influenza, pneumococcal, Hep B):  Flu, pneumonia, and shingrix all UTD as well as Tdap. Bone mass measurement: not indicated. Counseling to prevent tobacco use: n/a Depression screening: today, plus pt already being treated for recent hx of depression (lexapro). Glaucoma screening: has eye MD. Hepatitis C virus screening: pt declines HIV virus screening: pt declines Lung cancer screening: pt doesn't qualify. Medical nutrition therapy: n/a Prostate cancer screening: no further screening indicated. Screening mammography: n/a Screening pap tests, pelvic exam, and clinical breast exam: n/a Ultrasound screening for AAA: pt does not qualify.  A written plan of action regarding the above screening and preventative services was given to the patient today.  1) HTN: control is fine.  Continue atenolol 100 mg bid. Offered PT to try to maintain currently level of functioning and improve gait/strength but pt declined.  2) AWV: no new testing, screening, or referrals indicated.  Signed:  Crissie Sickles, MD           02/18/2019

## 2019-05-16 ENCOUNTER — Encounter: Payer: Self-pay | Admitting: Family Medicine

## 2019-05-23 ENCOUNTER — Other Ambulatory Visit: Payer: Self-pay | Admitting: Family Medicine

## 2019-05-27 ENCOUNTER — Encounter: Payer: Self-pay | Admitting: Family Medicine

## 2019-05-31 ENCOUNTER — Telehealth: Payer: Self-pay

## 2019-05-31 NOTE — Telephone Encounter (Signed)
[facility] to see if the services pt's wife is requested are provided, I was advised that this would be done through home health agency. The home health agency would be able to have CNA or sitter come out to assist with these type of things. Expecting a call on Monday or Tuesday from director, Carmelina Paddock with further information.

## 2019-05-31 NOTE — Telephone Encounter (Signed)
Patient's wife is unable to bathe patient. She said patient has used [employer] home health services in the past & she has a fax number [phone]. She needs our office to fax over an order. Patient said if we have any questions that we can call [place] and talk to the director Carmelina Paddock [place] [phone].

## 2019-06-03 NOTE — Telephone Encounter (Signed)
Spoke with Carmelina Paddock and she is going to reach out to [place] regarding bathing services. Caregiver comes to pt's home and may be able assist with this.

## 2019-06-06 ENCOUNTER — Encounter: Payer: Self-pay | Admitting: Family Medicine

## 2019-06-06 NOTE — Telephone Encounter (Signed)
Received a call from Jiles Garter of independent living asking if Dr Anitra Lauth would agree for patient to have PT and OT come out and evaluate patient. She is concerned about him not being active at all, sitting all day, and he stated "he is scared to get in the shower and falling". He did take short walk today but wife and director spent a lot of time convinncing him to get up. Director does not think he needs to be in the nursing home side yet but is afraid if he does not get his strength up that he will fall and he will then need to be moved. Pt has generalized weakness, scared of getting in and out of shower.  The facility uses Candi Leash from [facility], her number is [phone]. They are asking if orders can be sent there.

## 2019-06-06 NOTE — Telephone Encounter (Signed)
Pls order HH assessment of needs for patient--specify need for assistance with self care, also PT/OT assessment. Diagnoses are: debilitated patient, generalized weakness, unstable gait, self care deficit, and C2 spinal fracture (S12.100A).-thx

## 2019-06-10 ENCOUNTER — Other Ambulatory Visit: Payer: Self-pay

## 2019-06-10 DIAGNOSIS — R531 Weakness: Secondary | ICD-10-CM

## 2019-06-10 DIAGNOSIS — R2681 Unsteadiness on feet: Secondary | ICD-10-CM

## 2019-06-10 DIAGNOSIS — Z789 Other specified health status: Secondary | ICD-10-CM

## 2019-06-10 DIAGNOSIS — S12121A Other nondisplaced dens fracture, initial encounter for closed fracture: Secondary | ICD-10-CM

## 2019-06-10 DIAGNOSIS — R5381 Other malaise: Secondary | ICD-10-CM

## 2019-06-10 NOTE — Telephone Encounter (Signed)
HH order entered in epic, specific scheduling instructions for [facility].

## 2019-06-11 ENCOUNTER — Other Ambulatory Visit: Payer: Self-pay

## 2019-06-12 ENCOUNTER — Ambulatory Visit: Payer: Medicare Other | Admitting: Family Medicine

## 2019-06-12 ENCOUNTER — Encounter: Payer: Self-pay | Admitting: Family Medicine

## 2019-06-12 VITALS — BP 150/81 | HR 57 | Temp 98.5°F | Resp 16 | Ht 65.0 in | Wt 158.2 lb

## 2019-06-12 DIAGNOSIS — R2681 Unsteadiness on feet: Secondary | ICD-10-CM

## 2019-06-12 DIAGNOSIS — Z789 Other specified health status: Secondary | ICD-10-CM | POA: Diagnosis not present

## 2019-06-12 DIAGNOSIS — R5381 Other malaise: Secondary | ICD-10-CM | POA: Diagnosis not present

## 2019-06-12 DIAGNOSIS — R531 Weakness: Secondary | ICD-10-CM

## 2019-06-12 NOTE — Progress Notes (Signed)
OFFICE VISIT  06/12/2019   CC:  Chief Complaint  Patient presents with  . Home health referral    completed on 06/10/19, face to face visit needed for insurance   HPI:    Patient is a 86 y.o. Caucasian male who presents accompanied by his wife and son for discussion of need for [facility] services.   Decreased ability to walk.  Usually uses a walker, almost always in his house, outside of house once a week. Uses cane rarely and has to have someone by him.  NO falls.  Never has to use a wheelchair.  He can rise Afraid to fall in the shower.   Wife is in charge of dispensing his meds b/c he cannot reliable do so.   He denies any focal weakness but is generally weak.  He does not drive.   No pain anywhere.  He can prepare a simple meal but most of his meals are brought to him by dining room at [facility].  NO problems with bowels.  Has some urinary incont but just leaks urine between episodes of urinating.  Sleeps in normal bed.    He is completely unable to do his finances.  ROS: no fevers, no CP, no SOB, no wheezing, no cough, no dizziness, no HAs, no rashes, no melena/hematochezia.  No polyuria or polydipsia.  No myalgias or arthralgias.   Past Medical History:  Diagnosis Date  . C2 cervical fracture ([place]) 07/2017   Type II morphology with posterior displacement--C collar (potentially indefinitely).  . Chronic lower back pain   . Chronic renal insufficiency, stage III (moderate)    GFR 50s-60s  . Debilitated patient   . Diffuse large cell non-Hodgkin's lymphoma ([place]) 12/15/2017   B cell NHL involving bones only as of 01/2018.  RT 12/2017 helped with his pain; pt is to get hemo 01/2018.  PET 03/06/18: good response to tx.   . Generalized weakness   . Goals of care, counseling/discussion 12/15/2017  . History of colon cancer 1993  . History of CVA (cerebrovascular accident)    Left MCA territory.  Carotid dopplers ok, Brain MRA ok, ECHO with grd I DD.  As of 02/2018->no ASA or plavix or  statin due to pt getting chemo and RT + the related alteration in goals of care.  . Hypertension   . Lumbar spinal stenosis    Surgery 2012 helped but in 2015 pain has returned  . Lytic bone lesions on xray 08/2017   Skull, mildly abnormal bone scan. SPEP/UPEP w/out monoclonal spike.  Dr. Marin Olp eval 08/2017: plan CT C/A/P but not done as of 11/22/17.  ED visit s/p fall 11/17/17-->multiple lytic lesions now, L sacrum destroyed by tumor, lytic lesion in pelvis. Dr. Marin Olp has arranged for bx, also plan for RT and XGeva.  . Metastasis to bone of unknown primary ([place]) 11/27/2017   Large B cell lymphoma (bone bx).  RT 12/2017, set to get chemo 01/2018.  . Osteoarthritis   . Self-care deficit   . Unstable gait   . Venous insufficiency of both lower extremities    made worse by amlodipine    Past Surgical History:  Procedure Laterality Date  . CHOLECYSTECTOMY  1984  . INGUINAL HERNIA REPAIR  1974   Bilat  . LUMBAR SPINE SURGERY  2012   for spinal stenosis (Dr. Jonelle [place]  . LUNG LOBECTOMY  1993   LLL per pt--worry of possible cancer but turned out to be benign.  [name] PARTIAL COLECTOMY  1993   for colon cancer  . TRANSTHORACIC ECHOCARDIOGRAM  03/15/2016   Normal EF, grd I DD, mod dil aortic root    Outpatient Medications Prior to Visit  Medication Sig Dispense Refill  . atenolol (TENORMIN) 100 MG tablet TAKE ONE TABLET BY MOUTH EVERY DAY 90 tablet 0  . cholecalciferol (VITAMIN D) 1000 UNITS tablet Take 1,000 Units by mouth daily.    [name] escitalopram (LEXAPRO) 10 MG tablet Take 1 tablet (10 mg total) by mouth daily. 30 tablet 1  . vitamin C (ASCORBIC ACID) 500 MG tablet Take 500 mg by mouth daily.    . vitamin E 400 UNIT capsule Take 400 Units by mouth daily.     [name] escitalopram (LEXAPRO) 10 MG tablet TAKE ONE TABLET BY MOUTH EVERY DAY 90 tablet 1  . oxyCODONE (OXY IR/ROXICODONE) 5 MG immediate release tablet 1-2 tabs po qid prn pain (Patient not taking: Reported on 06/12/2019) 84 tablet 0    No facility-administered medications prior to visit.    No Known Allergies  ROS As per HPI  PE: Blood pressure (!) 150/81, pulse (!) 57, temperature 98.5 F (36.9 C), temperature source Temporal, resp. rate 16, height 5\' 5"  (1.651 m), weight 158 lb 3.2 oz (71.8 kg), SpO2 97 %. Body mass index is 26.33 kg/m.  Gen: Alert, well appearing.  Patient is oriented to person, place, time, and situation. AFFECT: pleasant, lucid thought and speech. CV: RRR, no m/r/g.   LUNGS: CTA bilat, nonlabored resps, good aeration in all lung fields. Neuro: CN 2-12 intact bilaterally, strength 5/5 in proximal and distal upper extremities and lower extremities bilaterally.  No sensory deficits.  No tremor. Upper extremity and lower extremity DTRs symmetric.  No pronator drift. Rises out of chair with 1 push on arm rests.  Uses cane to ambulate and takes small steps w/out shuffling, uses 3 point turn.  Sits into chair w/out much problem.   No cogwheel rigidity.  LABS:  Lab Results  Component Value Date   TSH 0.150 (L) 02/27/2018   Lab Results  Component Value Date   WBC 4.7 03/26/2018   HGB 10.5 (L) 03/26/2018   HCT 33.0 (L) 03/26/2018   MCV 93.8 03/26/2018   PLT 193 03/26/2018   Lab Results  Component Value Date   CREATININE 0.89 06/14/2018   BUN 14 06/14/2018   NA 135 06/14/2018   K 4.4 06/14/2018   CL 99 06/14/2018   CO2 30 06/14/2018   Lab Results  Component Value Date   ALT 12 03/26/2018   AST 18 03/26/2018   ALKPHOS 48 03/26/2018   BILITOT 0.5 03/26/2018   Lab Results  Component Value Date   CHOL 133 04/13/2017   Lab Results  Component Value Date   HDL 68 04/13/2017   Lab Results  Component Value Date   LDLCALC 49 04/13/2017   Lab Results  Component Value Date   TRIG 80 04/13/2017   Lab Results  Component Value Date   CHOLHDL 2.0 04/13/2017   Lab Results  Component Value Date   PSA 2.5 07/20/2017   Lab Results  Component Value Date   HGBA1C 5.2 03/15/2016     IMPRESSION AND PLAN:  Elderly, generalized weak patient to the point of general debility and unstable gait. Self care deficit exists. I think he would benefit greatly from home PT/OT.   An After Visit Summary was printed and given to the patient.  Spent 30 min with pt today, with >50% of this time  spent in counseling and care coordination regarding the above problems.  FOLLOW UP: Return in about 2 months (around 08/12/2019) for f/u debilitation.  Signed:  Crissie Sickles, MD           06/12/2019

## 2019-06-14 ENCOUNTER — Telehealth: Payer: Self-pay

## 2019-06-14 NOTE — Telephone Encounter (Signed)
OK. Do his wife and son know about the delay?

## 2019-06-14 NOTE — Telephone Encounter (Signed)
Patient advised of delay regaridng PT/OT

## 2019-06-14 NOTE — Telephone Encounter (Signed)
FYI. Please see below.  Delay of of PT SOC until 06/15/19 and OT evaluation by 3-4 weeks due to limited therapist availability.

## 2019-06-15 DIAGNOSIS — I129 Hypertensive chronic kidney disease with stage 1 through stage 4 chronic kidney disease, or unspecified chronic kidney disease: Secondary | ICD-10-CM | POA: Diagnosis not present

## 2019-06-15 DIAGNOSIS — M48061 Spinal stenosis, lumbar region without neurogenic claudication: Secondary | ICD-10-CM | POA: Diagnosis not present

## 2019-06-15 DIAGNOSIS — I872 Venous insufficiency (chronic) (peripheral): Secondary | ICD-10-CM | POA: Diagnosis not present

## 2019-06-15 DIAGNOSIS — M1990 Unspecified osteoarthritis, unspecified site: Secondary | ICD-10-CM | POA: Diagnosis not present

## 2019-06-15 DIAGNOSIS — C8339 Diffuse large B-cell lymphoma, extranodal and solid organ sites: Secondary | ICD-10-CM | POA: Diagnosis not present

## 2019-06-15 DIAGNOSIS — Z87891 Personal history of nicotine dependence: Secondary | ICD-10-CM | POA: Diagnosis not present

## 2019-06-15 DIAGNOSIS — N183 Chronic kidney disease, stage 3 unspecified: Secondary | ICD-10-CM | POA: Diagnosis not present

## 2019-06-21 ENCOUNTER — Telehealth: Payer: Self-pay | Admitting: Family Medicine

## 2019-06-21 NOTE — Telephone Encounter (Signed)
Please advise, thanks.

## 2019-06-21 NOTE — Telephone Encounter (Signed)
Physical Therapist discovered this weekend wife is having trouble bathing her husband. They would like a VO to have a home health aid and OT.

## 2019-06-21 NOTE — Telephone Encounter (Signed)
Gwenith Daily with [place] and verbal orders given per PCP.

## 2019-06-21 NOTE — Telephone Encounter (Signed)
Yes this is fine.

## 2019-06-25 ENCOUNTER — Telehealth: Payer: Self-pay

## 2019-06-25 NOTE — Telephone Encounter (Signed)
Verbal order given for OT evaluation and HHA for bathing assistance on 06/21/19 by PCP. Signature needed for documentation. Placed on PCP desk to review and sign, if appropriate.

## 2019-06-28 ENCOUNTER — Telehealth: Payer: Self-pay

## 2019-06-28 DIAGNOSIS — M48061 Spinal stenosis, lumbar region without neurogenic claudication: Secondary | ICD-10-CM | POA: Diagnosis not present

## 2019-06-28 DIAGNOSIS — I129 Hypertensive chronic kidney disease with stage 1 through stage 4 chronic kidney disease, or unspecified chronic kidney disease: Secondary | ICD-10-CM | POA: Diagnosis not present

## 2019-06-28 DIAGNOSIS — Z87891 Personal history of nicotine dependence: Secondary | ICD-10-CM

## 2019-06-28 DIAGNOSIS — M1990 Unspecified osteoarthritis, unspecified site: Secondary | ICD-10-CM

## 2019-06-28 DIAGNOSIS — C8339 Diffuse large B-cell lymphoma, extranodal and solid organ sites: Secondary | ICD-10-CM | POA: Diagnosis not present

## 2019-06-28 DIAGNOSIS — N183 Chronic kidney disease, stage 3 unspecified: Secondary | ICD-10-CM | POA: Diagnosis not present

## 2019-06-28 DIAGNOSIS — I872 Venous insufficiency (chronic) (peripheral): Secondary | ICD-10-CM

## 2019-06-28 NOTE — Telephone Encounter (Signed)
FYI. Please see below.  Patient was supposed to have PT today but refused just wanting to remain in bed and sleep.

## 2019-06-28 NOTE — Telephone Encounter (Signed)
Noted  

## 2019-07-03 NOTE — Telephone Encounter (Signed)
Signed and put in box to go up front. Signed:  Phil Byrdie Miyazaki, MD           07/03/2019  

## 2019-07-05 ENCOUNTER — Telehealth: Payer: Self-pay

## 2019-07-05 NOTE — Telephone Encounter (Signed)
Received fax needing signed orders for [facility] certification and plan of care. Placed on PCP desk to review and sign, if appropriate.

## 2019-07-08 ENCOUNTER — Telehealth: Payer: Self-pay

## 2019-07-08 ENCOUNTER — Other Ambulatory Visit: Payer: Self-pay | Admitting: Family Medicine

## 2019-07-08 NOTE — Telephone Encounter (Signed)
Sent to Dr.McGowen as Juluis Rainier since pt is declining services.  Received a call from [facility] home health, OT evaluation was completed today and believes pt would benefit from 4 more weeks of OT and home health aide. Pt is declining services.

## 2019-07-08 NOTE — Telephone Encounter (Signed)
Noted  

## 2019-07-11 ENCOUNTER — Telehealth: Payer: Self-pay

## 2019-07-11 NOTE — Telephone Encounter (Signed)
Noted  

## 2019-07-11 NOTE — Telephone Encounter (Signed)
Sent as FYI.  Pt is being d/c from [facility] PT with [facility]. During his last visit, he stated he did not want or need it. Nothing further needed.

## 2019-07-23 NOTE — Telephone Encounter (Signed)
Signed and put in box to go up front. Signed:  Crissie Sickles, MD           07/23/2019

## 2019-07-23 NOTE — Telephone Encounter (Signed)
Received forms from [facility] regarding pt refusal for OT and HHA services. Placed on PCP desk to review and sign, if appropriate.

## 2019-08-21 ENCOUNTER — Telehealth: Payer: Self-pay

## 2019-08-21 NOTE — Telephone Encounter (Signed)
Received fax from [facility] requesting most recent 2 visits/ last 6 months o/v notes to be sent for care of [facility]. Last 2 o/v notes printed and faxed to number provided.

## 2019-09-18 ENCOUNTER — Other Ambulatory Visit: Payer: Self-pay | Admitting: Family Medicine

## 2019-11-07 ENCOUNTER — Ambulatory Visit: Payer: Medicare Other | Admitting: Family Medicine

## 2019-11-13 ENCOUNTER — Ambulatory Visit: Payer: Medicare Other | Admitting: Family Medicine

## 2019-11-14 ENCOUNTER — Encounter: Payer: Self-pay | Admitting: Family Medicine

## 2019-11-14 ENCOUNTER — Other Ambulatory Visit: Payer: Self-pay

## 2019-11-14 ENCOUNTER — Ambulatory Visit: Payer: Medicare Other | Admitting: Family Medicine

## 2019-11-14 VITALS — BP 159/97 | HR 57 | Temp 98.1°F | Ht 65.0 in | Wt 161.0 lb

## 2019-11-14 DIAGNOSIS — N1831 Chronic kidney disease, stage 3a: Secondary | ICD-10-CM | POA: Diagnosis not present

## 2019-11-14 DIAGNOSIS — R5381 Other malaise: Secondary | ICD-10-CM | POA: Diagnosis not present

## 2019-11-14 DIAGNOSIS — Z7409 Other reduced mobility: Secondary | ICD-10-CM

## 2019-11-14 DIAGNOSIS — I10 Essential (primary) hypertension: Secondary | ICD-10-CM

## 2019-11-14 DIAGNOSIS — R2681 Unsteadiness on feet: Secondary | ICD-10-CM

## 2019-11-14 NOTE — Progress Notes (Signed)
OFFICE VISIT  11/14/2019   CC:  Chief Complaint  Patient presents with  . Hypertension   HPI:    Patient is a 86 y.o. Caucasian male who presents for f/u HTN, CRI III, hx of NHL, hx of C2 fracture, chronic debilitation/very limited endurance. Still living with wife at [facility].  Still signif generalized weakness. Uses a cane but cannot walk w/out someone beside him. Wanting to try to get motorized scooter. Can walk about 15 steps then has to stop and rest.  Has to take all meals in his cottage instead of in [facility] b/c it would take 45 min for him to walk one way! No pain.    Home bp's consistently in normal range or mild HTN similar to today's. He does NOT drink clear fluids much at all, mostly drinks coffee and tea. He does not take any NSAIDs. He has not had any f/u with his neurosurg (c spine fx) or his oncologist (NHL) in the recent past and does not have plans to do so.  ROS: no fevers, no CP, no SOB, no wheezing, no cough, no dizziness, no HAs, no rashes, no melena/hematochezia.  No polyuria or polydipsia.  No myalgias or arthralgias.  No focal weakness, paresthesias, or tremors.  No acute vision or hearing abnormalities. No n/v/d or abd pain.  No palpitations.     Past Medical History:  Diagnosis Date  . C2 cervical fracture ([place]) 07/2017   Type II morphology with posterior displacement--C collar (potentially indefinitely).  . Chronic lower back pain   . Chronic renal insufficiency, stage III (moderate)    GFR 50s-60s  . Debilitated patient   . 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  RT 12/2017 helped with his pain; pt is to get hemo 01/2018.  PET 03/06/18: good response to tx.   . Generalized weakness   . Goals of care, counseling/discussion 12/15/2017  . History of colon cancer 1993  . History of CVA (cerebrovascular accident)    Left MCA territory.  Carotid dopplers  ok, Brain MRA ok, ECHO with grd I DD.  As of 02/2018->no ASA or plavix or statin due to pt getting chemo and RT + the related alteration in goals of care.  . Hypertension   . Lumbar spinal stenosis    Surgery 2012 helped but in 2015 pain has returned  . Lytic bone lesions on xray 08/2017   Skull, mildly abnormal bone scan. SPEP/UPEP w/out monoclonal spike.  Dr. Marin Olp eval 08/2017: plan CT C/A/P but not done as of 11/22/17.  ED visit s/p fall 11/17/17-->multiple lytic lesions now, L sacrum destroyed by tumor, lytic lesion in pelvis. Dr. Marin Olp has arranged for bx, also plan for RT and XGeva.  . Metastasis to bone of unknown primary ([place]) 11/27/2017   Large B cell lymphoma (bone bx).  RT 12/2017, set to get chemo 01/2018.  . Osteoarthritis   . Self-care deficit   . Unstable gait   . Venous insufficiency of both lower extremities    made worse by amlodipine    Past Surgical History:  Procedure Laterality Date  . CHOLECYSTECTOMY  1984  . INGUINAL HERNIA REPAIR  1974   Bilat  . LUMBAR SPINE SURGERY  2012   for spinal stenosis (Dr. Jonelle [place]  . LUNG LOBECTOMY  1993   LLL per pt--worry of possible cancer but turned out to be benign.  [name] PARTIAL  COLECTOMY  1993   for colon cancer  . TRANSTHORACIC ECHOCARDIOGRAM  03/15/2016   Normal EF, grd I DD, mod dil aortic root    Outpatient Medications Prior to Visit  Medication Sig Dispense Refill  . atenolol (TENORMIN) 100 MG tablet TAKE ONE TABLET BY MOUTH EVERY DAY 90 tablet 0  . cholecalciferol (VITAMIN D) 1000 UNITS tablet Take 1,000 Units by mouth daily.    [name] escitalopram (LEXAPRO) 10 MG tablet TAKE ONE TABLET BY MOUTH EVERY DAY 30 tablet 1  . oxyCODONE (OXY IR/ROXICODONE) 5 MG immediate release tablet 1-2 tabs po qid prn pain 84 tablet 0  . vitamin C (ASCORBIC ACID) 500 MG tablet Take 500 mg by mouth daily.    . vitamin E 400 UNIT capsule Take 400 Units by mouth daily.      No facility-administered medications prior to visit.    No  Known Allergies  ROS As per HPI  PE: Vitals with BMI 11/14/2019 06/12/2019 02/18/2019  Height 5\' 5"  5\' 5"  5\' 5"   Weight 161 lbs 158 lbs 3 oz 161 lbs 13 oz  BMI 26.79 86.57 84.69  Systolic 629 528 413  Diastolic 97 81 97  Pulse 57 57 76  O2 sat on RA today is 98%  Gen: Alert, well appearing.  Patient is oriented to person, place, time, and situation. AFFECT: pleasant, lucid thought and speech. CV: RRR, no m/r/g.   LUNGS: CTA bilat, nonlabored resps, good aeration in all lung fields. ABD: soft, NT/ND EXT: no clubbing or cyanosis.  no edema.  Neuro: CN 2-12 intact bilaterally, strength 5-/5 in proximal and distal upper extremities and lower extremities bilaterally.  No tremor.  He walks extremely slow with a cane and has to have a person holding his arm for stabilization/comfort/reassurance.  No veering or wide based gait.  No pronator drift. No TTP of neck or back or hips.  LABS:  Lab Results  Component Value Date   TSH 0.150 (L) 02/27/2018   Lab Results  Component Value Date   WBC 4.7 03/26/2018   HGB 10.5 (L) 03/26/2018   HCT 33.0 (L) 03/26/2018   MCV 93.8 03/26/2018   PLT 193 03/26/2018   No results found for: IRON, TIBC, FERRITIN  Lab Results  Component Value Date   CREATININE 0.89 06/14/2018   BUN 14 06/14/2018   NA 135 06/14/2018   K 4.4 06/14/2018   CL 99 06/14/2018   CO2 30 06/14/2018   Lab Results  Component Value Date   ALT 12 03/26/2018   AST 18 03/26/2018   ALKPHOS 48 03/26/2018   BILITOT 0.5 03/26/2018   Lab Results  Component Value Date   CHOL 133 04/13/2017   Lab Results  Component Value Date   HDL 68 04/13/2017   Lab Results  Component Value Date   LDLCALC 49 04/13/2017   Lab Results  Component Value Date   TRIG 80 04/13/2017   Lab Results  Component Value Date   CHOLHDL 2.0 04/13/2017   Lab Results  Component Value Date   PSA 2.5 07/20/2017   Lab Results  Component Value Date   HGBA1C 5.2 03/15/2016    IMPRESSION AND  PLAN:  1) Impaired functional mobility, balance, gait, and endurance. I definitely feel that he needs motorized scooter and that it is necessary for safe mobility in his environment and give ability to participate in the reasonable amount of activity at his living facility (such as going to the dining room to eat with others).  2) HTN: The current medical regimen is effective;  continue present plan and medications.  3) CRI III: needs to hydrate better. He avoids NSAIDs. Lytes/cr today.  An After Visit Summary was printed and given to the patient.  FOLLOW UP: No follow-ups on file.  Signed:  Crissie Sickles, MD           11/14/2019

## 2019-11-14 NOTE — Addendum Note (Signed)
Addended by: Deveron Furlong D on: 11/14/2019 03:21 PM   Modules accepted: Orders

## 2019-11-29 ENCOUNTER — Telehealth: Payer: Self-pay

## 2019-11-29 NOTE — Telephone Encounter (Signed)
Received [employer] forms via fax for completion in order for patient to receive power mobility device. Some sections pre-filled, placed on PCP desk for final completion.  

## 2019-12-03 NOTE — Telephone Encounter (Signed)
Paperwork placed in bin to go up front. 

## 2019-12-03 NOTE — Telephone Encounter (Signed)
Paperwork completed/signed and put on your desk. 

## 2019-12-11 DIAGNOSIS — H5462 Unqualified visual loss, left eye, normal vision right eye: Secondary | ICD-10-CM

## 2019-12-11 DIAGNOSIS — H53132 Sudden visual loss, left eye: Secondary | ICD-10-CM

## 2019-12-11 HISTORY — DX: Unqualified visual loss, left eye, normal vision right eye: H54.62

## 2019-12-11 HISTORY — DX: Sudden visual loss, left eye: H53.132

## 2019-12-13 ENCOUNTER — Other Ambulatory Visit: Payer: Self-pay | Admitting: Family Medicine

## 2020-01-06 ENCOUNTER — Other Ambulatory Visit: Payer: Self-pay | Admitting: Family Medicine

## 2020-01-07 DIAGNOSIS — H35373 Puckering of macula, bilateral: Secondary | ICD-10-CM | POA: Diagnosis not present

## 2020-01-07 DIAGNOSIS — H04123 Dry eye syndrome of bilateral lacrimal glands: Secondary | ICD-10-CM | POA: Diagnosis not present

## 2020-01-07 DIAGNOSIS — H43813 Vitreous degeneration, bilateral: Secondary | ICD-10-CM | POA: Diagnosis not present

## 2020-01-07 DIAGNOSIS — H353112 Nonexudative age-related macular degeneration, right eye, intermediate dry stage: Secondary | ICD-10-CM | POA: Diagnosis not present

## 2020-01-07 DIAGNOSIS — H35033 Hypertensive retinopathy, bilateral: Secondary | ICD-10-CM | POA: Diagnosis not present

## 2020-01-07 DIAGNOSIS — H35362 Drusen (degenerative) of macula, left eye: Secondary | ICD-10-CM | POA: Diagnosis not present

## 2020-01-08 ENCOUNTER — Encounter: Payer: Self-pay | Admitting: Family Medicine

## 2020-01-08 ENCOUNTER — Telehealth: Payer: Self-pay

## 2020-01-08 DIAGNOSIS — H53482 Generalized contraction of visual field, left eye: Secondary | ICD-10-CM | POA: Diagnosis not present

## 2020-01-08 DIAGNOSIS — H53132 Sudden visual loss, left eye: Secondary | ICD-10-CM

## 2020-01-08 NOTE — Telephone Encounter (Signed)
Spoke with patient and advised patient's wife, he needs to come in for lab visit as soon as possible at their earliest convenience.  Appt scheduled for 2:45 on Friday, 10/1.

## 2020-01-08 NOTE — Telephone Encounter (Signed)
(  Dr. Posey Pronto at [facility]: pt had sudden onset L eye vision loss. She is getting MR of orbits/brain.  She asked me to check vasculitis labs and possibly get MRI angio to eval for aneurism).   Pls call pt and have him come in for nonfasting labs that Dr. Posey Pronto wanted me to get on him--at his earliest convenience. I'll put orders in.-thx

## 2020-01-08 NOTE — Telephone Encounter (Signed)
[facility], Dr. Posey Pronto, [phone] requesting [place] return call.

## 2020-01-08 NOTE — Telephone Encounter (Signed)
Please contact their office

## 2020-01-09 NOTE — Telephone Encounter (Signed)
Patient was scheduled for imaging with [facility] on 10/7 at 9am with 8:30 check in. It was recommended by Dr.Patel to keep this appt instead on scheduled imaging appt on 10/9. LM for pt to return call.

## 2020-01-10 ENCOUNTER — Ambulatory Visit: Payer: Medicare Other

## 2020-01-10 DIAGNOSIS — J3489 Other specified disorders of nose and nasal sinuses: Secondary | ICD-10-CM

## 2020-01-10 HISTORY — DX: Other specified disorders of nose and nasal sinuses: J34.89

## 2020-01-10 NOTE — Telephone Encounter (Signed)
Spoke with patient and he stated he was unsure if transportation would be available that early to make appt on 10/7 but would try. Both appointments still set for 10/7 and 10/9 currently. Number provided for [facility]. Advised to call our office back by 5pm today if unable to make 10/7 appt.

## 2020-01-13 ENCOUNTER — Other Ambulatory Visit: Payer: Self-pay | Admitting: Family Medicine

## 2020-01-13 NOTE — Telephone Encounter (Signed)
Spoke with patient, coming for labs on 10/6.

## 2020-01-13 NOTE — Telephone Encounter (Signed)
Pls try to contact pt again to see if they can come in for labs like we tried to get him to do last week. Needs to have kidney function assessed prior to getting his MRI.

## 2020-01-15 ENCOUNTER — Ambulatory Visit: Payer: Medicare Other

## 2020-01-16 NOTE — Telephone Encounter (Signed)
Sent as Richelle Ito with Dr.Patel regarding getting labs done for MRI. Options were to reach out to patient to have blood work completed prior to MRI, order MRI without contrast or have patient go to ER. Spoke with patient and explained labs were needed and lab appt scheduled for tomorrow at 4pm. Pre-authorization completed. Follow up message is being sent to [facility] at [facility] for possible scheduling assistance. Dr.Patel notified of this as well.

## 2020-01-16 NOTE — Telephone Encounter (Signed)
Thank you Britt. °Great job. °

## 2020-01-17 ENCOUNTER — Other Ambulatory Visit: Payer: Self-pay

## 2020-01-17 ENCOUNTER — Telehealth: Payer: Self-pay

## 2020-01-17 ENCOUNTER — Encounter (HOSPITAL_COMMUNITY): Payer: Self-pay

## 2020-01-17 ENCOUNTER — Emergency Department (HOSPITAL_COMMUNITY)
Admission: EM | Admit: 2020-01-17 | Discharge: 2020-01-17 | Disposition: A | Discharge status: Home / Self Care | Payer: Medicare Other | Attending: Emergency Medicine | Admitting: Emergency Medicine

## 2020-01-17 ENCOUNTER — Emergency Department (HOSPITAL_COMMUNITY): Payer: Medicare Other

## 2020-01-17 ENCOUNTER — Ambulatory Visit: Payer: Medicare Other

## 2020-01-17 DIAGNOSIS — G9389 Other specified disorders of brain: Secondary | ICD-10-CM | POA: Diagnosis not present

## 2020-01-17 DIAGNOSIS — H5462 Unqualified visual loss, left eye, normal vision right eye: Secondary | ICD-10-CM | POA: Diagnosis not present

## 2020-01-17 DIAGNOSIS — I700 Atherosclerosis of aorta: Secondary | ICD-10-CM | POA: Insufficient documentation

## 2020-01-17 DIAGNOSIS — H43813 Vitreous degeneration, bilateral: Secondary | ICD-10-CM | POA: Diagnosis not present

## 2020-01-17 DIAGNOSIS — Z79899 Other long term (current) drug therapy: Secondary | ICD-10-CM | POA: Diagnosis not present

## 2020-01-17 DIAGNOSIS — J349 Unspecified disorder of nose and nasal sinuses: Secondary | ICD-10-CM | POA: Diagnosis not present

## 2020-01-17 DIAGNOSIS — I10 Essential (primary) hypertension: Secondary | ICD-10-CM | POA: Diagnosis not present

## 2020-01-17 DIAGNOSIS — I6523 Occlusion and stenosis of bilateral carotid arteries: Secondary | ICD-10-CM | POA: Diagnosis not present

## 2020-01-17 DIAGNOSIS — H35373 Puckering of macula, bilateral: Secondary | ICD-10-CM | POA: Diagnosis not present

## 2020-01-17 DIAGNOSIS — I672 Cerebral atherosclerosis: Secondary | ICD-10-CM | POA: Diagnosis not present

## 2020-01-17 DIAGNOSIS — J329 Chronic sinusitis, unspecified: Secondary | ICD-10-CM | POA: Diagnosis not present

## 2020-01-17 DIAGNOSIS — Z87891 Personal history of nicotine dependence: Secondary | ICD-10-CM | POA: Diagnosis not present

## 2020-01-17 DIAGNOSIS — N183 Chronic kidney disease, stage 3 unspecified: Secondary | ICD-10-CM | POA: Diagnosis not present

## 2020-01-17 DIAGNOSIS — I129 Hypertensive chronic kidney disease with stage 1 through stage 4 chronic kidney disease, or unspecified chronic kidney disease: Secondary | ICD-10-CM | POA: Diagnosis not present

## 2020-01-17 DIAGNOSIS — H04123 Dry eye syndrome of bilateral lacrimal glands: Secondary | ICD-10-CM | POA: Diagnosis not present

## 2020-01-17 DIAGNOSIS — E041 Nontoxic single thyroid nodule: Secondary | ICD-10-CM | POA: Diagnosis not present

## 2020-01-17 DIAGNOSIS — J3489 Other specified disorders of nose and nasal sinuses: Secondary | ICD-10-CM

## 2020-01-17 DIAGNOSIS — H547 Unspecified visual loss: Secondary | ICD-10-CM | POA: Diagnosis present

## 2020-01-17 LAB — CBC
HCT: 41.2 % (ref 39.0–52.0)
Hemoglobin: 13.6 g/dL (ref 13.0–17.0)
MCH: 29.2 pg (ref 26.0–34.0)
MCHC: 33 g/dL (ref 30.0–36.0)
MCV: 88.6 fL (ref 80.0–100.0)
Platelets: 183 10*3/uL (ref 150–400)
RBC: 4.65 MIL/uL (ref 4.22–5.81)
RDW: 14.9 % (ref 11.5–15.5)
WBC: 8.7 10*3/uL (ref 4.0–10.5)
nRBC: 0 % (ref 0.0–0.2)

## 2020-01-17 LAB — BASIC METABOLIC PANEL
Anion gap: 12 (ref 5–15)
BUN: 18 mg/dL (ref 8–23)
CO2: 22 mmol/L (ref 22–32)
Calcium: 9.1 mg/dL (ref 8.9–10.3)
Chloride: 98 mmol/L (ref 98–111)
Creatinine, Ser: 1.21 mg/dL (ref 0.61–1.24)
GFR, Estimated: 50 mL/min — ABNORMAL LOW (ref >60)
Glucose, Bld: 77 mg/dL (ref 70–99)
Potassium: 4.9 mmol/L (ref 3.5–5.1)
Sodium: 132 mmol/L — ABNORMAL LOW (ref 135–145)

## 2020-01-17 LAB — C-REACTIVE PROTEIN: CRP: 0.8 mg/dL (ref <1.0)

## 2020-01-17 LAB — SEDIMENTATION RATE: Sed Rate: 10 mm/hr (ref 0–16)

## 2020-01-17 MED ORDER — GADOBUTROL 1 MMOL/ML IV SOLN
7.0000 mL | Freq: Once | INTRAVENOUS | Status: AC | PRN
  Administered 2020-01-17: 7 mL via INTRAVENOUS

## 2020-01-17 MED ORDER — IOHEXOL 350 MG/ML SOLN
75.0000 mL | Freq: Once | INTRAVENOUS | Status: AC | PRN
  Administered 2020-01-17: 75 mL via INTRAVENOUS

## 2020-01-17 NOTE — ED Triage Notes (Signed)
Pt arrives to ED w/ c/o decreased vision in L eye over the last several months. Pt sent from eye doctor for eval and MRI. Pt otherwise neuro intact.

## 2020-01-17 NOTE — Discharge Instructions (Signed)
Please return for headaches or change in vision

## 2020-01-17 NOTE — Telephone Encounter (Signed)
Patient's ophthalmologic doctor contacted our office. They have an appointment with the patient today. The doctor is going to advise patient that there is nothing that she can do for him and for him to go to ER so that he can see a neuro ophthalmologist

## 2020-01-17 NOTE — Telephone Encounter (Signed)
FYI  Please see below

## 2020-01-17 NOTE — ED Notes (Signed)
Discharge instructions along with follow up care discussed with pt and wife at beside. Pt verbalized understanding with no questions at this time.

## 2020-01-17 NOTE — ED Notes (Signed)
Patient transported to MRI 

## 2020-01-17 NOTE — Telephone Encounter (Signed)
Noted  

## 2020-01-17 NOTE — ED Provider Notes (Signed)
[facility] EMERGENCY DEPARTMENT Provider Note   CSN: [id number] Arrival date & time: 01/17/20  1546     History Chief Complaint  Patient presents with  . Loss of Vision    Brendan Gonzalez is a 86 y.o. male.  86 yo M with a chief complaint of left-sided vision loss.  This has been going on for about a week or so.  Went to his ophthalmologist who noted a significant decrease in visual field and sent him to the ED for evaluation for stroke.  Patient denies one-sided numbness or weakness denies difficulty with speech or swallowing.  Denies trauma to the head.  Feels like he can still see out of that eye but feels like it is much worse than normal.  The history is provided by the patient.  Illness Severity:  Moderate Onset quality:  Gradual Duration:  1 week Timing:  Constant Progression:  Worsening Chronicity:  New Associated symptoms: no abdominal pain, no chest pain, no congestion, no diarrhea, no fever, no headaches, no myalgias, no rash, no shortness of breath and no vomiting        Past Medical History:  Diagnosis Date  . C2 cervical fracture ([place]) 07/2017   Type II morphology with posterior displacement--C collar (potentially indefinitely).  . Chronic lower back pain   . Chronic renal insufficiency, stage III (moderate) (HCC)    GFR 50s-60s  . Debilitated patient   . Diffuse large cell non-Hodgkin's lymphoma ([place]) 12/15/2017   B cell NHL involving bones only as of 01/2018.  RT 12/2017 helped with his pain; pt is to get hemo 01/2018.  PET 03/06/18: good response to tx.   . Generalized weakness   . Goals of care, counseling/discussion 12/15/2017  . History of colon cancer 1993  . History of CVA (cerebrovascular accident)    Left MCA territory.  Carotid dopplers ok, Brain MRA ok, ECHO with grd I DD.  As of 02/2018->no ASA or plavix or statin due to pt getting chemo and RT + the related alteration in goals of care.  . Hypertension   . Lumbar spinal stenosis     Surgery 2012 helped but in 2015 pain has returned  . Lytic bone lesions on xray 08/2017   Skull, mildly abnormal bone scan. SPEP/UPEP w/out monoclonal spike.  Dr. Marin Olp eval 08/2017: plan CT C/A/P but not done as of 11/22/17.  ED visit s/p fall 11/17/17-->multiple lytic lesions now, L sacrum destroyed by tumor, lytic lesion in pelvis. Dr. Marin Olp has arranged for bx, also plan for RT and XGeva.  . Metastasis to bone of unknown primary ([place]) 11/27/2017   Large B cell lymphoma (bone bx).  RT 12/2017, set to get chemo 01/2018.  . Osteoarthritis   . Self-care deficit   . Sudden loss of vision, left 12/2019   Dr. Posey Pronto at [facility]-->exam ok-->retinal specialist->ok-->MR brain/orbits ordered by ophth as of 01/08/20. I'll do w/u for vasculitis +/- imaging to look for aneurism.  [name] Unstable gait   . Venous insufficiency of both lower extremities    made worse by amlodipine    Patient Active Problem List   Diagnosis Date Noted  . Adjustment disorder 05/02/2018  . Bacteremia 02/27/2018  . ARF (acute renal failure) ([place]) 02/27/2018  . Thrombocytopenia ([place]) 02/27/2018  . Acute kidney injury ([place])   . Diffuse large cell non-Hodgkin's lymphoma ([place]) 12/15/2017  . Goals of care, counseling/discussion 12/15/2017  . Metastasis to bone of unknown primary ([place]) 11/27/2017  . Cerebral  embolism with cerebral infarction 03/15/2016  . Cryptogenic stroke ([place]) 03/15/2016  . Stroke-like symptoms 03/14/2016  . Slurred speech   . Essential hypertension   . Stage 3 chronic kidney disease ([place])   . Acute bronchitis 01/27/2014    Past Surgical History:  Procedure Laterality Date  . CHOLECYSTECTOMY  1984  . INGUINAL HERNIA REPAIR  1974   [place]  . LUMBAR SPINE SURGERY  2012   for spinal stenosis (Dr. Jonelle Sports in [place])  . LUNG LOBECTOMY  1993   LLL per pt--worry of possible cancer but turned out to be benign.  [name] PARTIAL COLECTOMY  1993   for colon cancer  . TRANSTHORACIC ECHOCARDIOGRAM  03/15/2016   Normal  EF, grd I DD, mod dil aortic root       Family History  Problem Relation Age of Onset  . Parkinson's disease Mother   . Heart attack Mother   . Cancer Brother   . Cancer Daughter     Social History   Tobacco Use  . Smoking status: Former Research scientist (life sciences)  . Smokeless tobacco: Never Used  Vaping Use  . Vaping Use: Never used  Substance Use Topics  . Alcohol use: Yes    Comment: daily, gin every night  . Drug use: No    Home Medications Prior to Admission medications   Medication Sig Start Date End Date Taking? Authorizing Provider  atenolol (TENORMIN) 100 MG tablet TAKE ONE TABLET BY MOUTH EVERY DAY 12/13/19  Yes McGowen, Adrian Blackwater, MD  cholecalciferol (VITAMIN D) 1000 UNITS tablet Take 1,000 Units by mouth daily.   Yes [provider]  escitalopram (LEXAPRO) 10 MG tablet TAKE ONE TABLET BY MOUTH EVERY DAY 01/13/20  Yes McGowen, Adrian Blackwater, MD  vitamin C (ASCORBIC ACID) 500 MG tablet Take 500 mg by mouth daily.   Yes [provider]  vitamin E 400 UNIT capsule Take 400 Units by mouth daily.    Yes [provider]    Allergies    Patient has no known allergies.  Review of Systems   Review of Systems  Constitutional: Negative for chills and fever.  HENT: Negative for congestion and facial swelling.   Eyes: Positive for visual disturbance. Negative for discharge.  Respiratory: Negative for shortness of breath.   Cardiovascular: Negative for chest pain and palpitations.  Gastrointestinal: Negative for abdominal pain, diarrhea and vomiting.  Musculoskeletal: Negative for arthralgias and myalgias.  Skin: Negative for color change and rash.  Neurological: Negative for tremors, syncope and headaches.  Psychiatric/Behavioral: Negative for confusion and dysphoric mood.    Physical Exam Updated Vital Signs BP (!) 178/80   Pulse 66   Temp 97.6 F (36.4 C) (Oral)   Resp 17   SpO2 98%   Physical Exam Vitals and nursing note reviewed.  Constitutional:       Appearance: He is well-developed.  HENT:     Head: Normocephalic and atraumatic.  Eyes:     Pupils: Pupils are equal, round, and reactive to light.  Neck:     Vascular: No JVD.  Cardiovascular:     Rate and Rhythm: Normal rate and regular rhythm.     Heart sounds: No murmur heard.  No friction rub. No gallop.   Pulmonary:     Effort: No respiratory distress.     Breath sounds: No wheezing.  Abdominal:     General: There is no distension.     Tenderness: There is no abdominal tenderness. There is no guarding or rebound.  Musculoskeletal:        General: Normal range of motion.     Cervical back: Normal range of motion and neck supple.  Skin:    Coloration: Skin is not pale.     Findings: No rash.  Neurological:     Mental Status: He is alert and oriented to person, place, and time.     GCS: GCS eye subscore is 4. GCS verbal subscore is 5. GCS motor subscore is 6.     Cranial Nerves: Cranial nerves are intact.     Sensory: Sensation is intact.     Motor: Motor function is intact.     Coordination: Coordination is intact.  Psychiatric:        Behavior: Behavior normal.     ED Results / Procedures / Treatments   Labs (all labs ordered are listed, but only abnormal results are displayed) Labs Reviewed  BASIC METABOLIC PANEL - Abnormal; Notable for the following components:      Result Value   Sodium 132 (*)    GFR, Estimated 50 (*)    All other components within normal limits  CBC  SEDIMENTATION RATE  C-REACTIVE PROTEIN    EKG EKG Interpretation  Date/Time:  Friday January 17 2020 16:55:35 EDT Ventricular Rate:  59 PR Interval:    QRS Duration: 82 QT Interval:  451 QTC Calculation: 447 R Axis:   -8 Text Interpretation: Sinus rhythm Supraventricular bigeminy Consider anterior infarct No significant change since last tracing Confirmed by Deno Etienne [phone]) on 01/17/2020 4:57:09 PM   Radiology CT Angio Head W or Wo Contrast  Result Date: 01/17/2020 CLINICAL  DATA:  Left-sided vision loss with progression over the last 7 months. EXAM: CT ANGIOGRAPHY HEAD AND NECK TECHNIQUE: Multidetector CT imaging of the head and neck was performed using the standard protocol during bolus administration of intravenous contrast. Multiplanar CT image reconstructions and MIPs were obtained to evaluate the vascular anatomy. Carotid stenosis measurements (when applicable) are obtained utilizing NASCET criteria, using the distal internal carotid diameter as the denominator. CONTRAST:  73m OMNIPAQUE IOHEXOL 350 MG/ML SOLN COMPARISON:  None. FINDINGS: CT HEAD FINDINGS Brain: Moderate atrophy and white matter disease is present. No acute infarct, hemorrhage, or mass lesion is present. Ventricles are proportionate to the degree of atrophy. No significant after subtraction fluid collection is present. Vascular: Atherosclerotic calcifications are present within the cavernous internal carotid arteries and at the dural margin of the vertebral arteries. No hyperdense vessel present. Skull: Lucent lesions near the apex are stable and likely represent hemangiomas on the MRI. Sinuses: A soft tissue mass lesion is present in the posterior left ethmoid air cells and sphenoid sinus measuring 5.1 x 2.7 x 1.8 cm. This extends to the orbital apex creates mass effect on the orbit. Lesion is better described on the MRI. Orbits: Mass effect noted in the left orbit. Bilateral lens replacements are present. Globes are otherwise normal. Tumor extends into the left orbital apex, impacting the left optic nerve. Review of the MIP images confirms the above findings CTA NECK FINDINGS Aortic arch: Atherosclerotic calcifications are present in the distal arch. Common origin is present the left common carotid artery and innominate artery. Right carotid system: Atherosclerotic changes are noted at the origin of the right common carotid artery and at the right carotid bifurcation without significant stenosis. Cervical ICA  demonstrates moderate distal tortuosity without significant stenosis. Left carotid system: Carotid bifurcation demonstrates some atherosclerotic change. No significant stenosis is present. Mild tortuosity is present cervical  left ICA. Vertebral arteries: Vertebral arteries originate from the subclavian arteries bilaterally without significant stenosis. The left vertebral artery is the dominant vessel. No significant stenosis is present in either vertebral artery in the neck. Skeleton: Remote dens fracture is noted. Degenerative changes are present throughout the cervical spine. Fusion is noted across the disc space C5-6. No focal lytic or blastic lesions are present. Slight anterolisthesis is present at C7-T1. A remote superior endplate fractures present at T4. Vertebral body heights are otherwise maintained. Other neck: 1.4 cm nodule is present in the right lobe of the thyroid. Thyroid is otherwise unremarkable. No significant adenopathy is present. Salivary glands are within normal limits. No focal mucosal lesions are present. Upper chest: Lung apices are clear. Thoracic inlet is within normal limits. Review of the MIP images confirms the above findings CTA HEAD FINDINGS Anterior circulation: Atherosclerotic calcifications are present within the cavernous internal carotid arteries bilaterally without focal stenosis. The A1 and M1 segments are normal. MCA bifurcations are within normal limits. No significant proximal stenosis or occlusion is present. Posterior circulation: Atherosclerotic changes are present at the dural margin of both vertebral arteries without significant stenosis. PICA origin is visualized and normal. The basilar artery is normal. Both posterior cerebral arteries originate from the basilar tip. Venous sinuses: Dural sinuses are patent. Straight sinus deep cerebral veins are intact. Cortical veins are unremarkable. Anatomic variants: None Review of the MIP images confirms the above findings  IMPRESSION: 1. 5.1 x 2.7 x 1.8 cm soft tissue mass lesion in the posterior left ethmoid air cells and sphenoid sinus extending to the left orbital apex, impacting the left optic nerve. This most likely represents a malignancy. Differential diagnosis includes sinonasal undifferentiated carcinoma or squamous cell carcinoma. Esthesioneuroblastoma is less likely. Metastatic disease is considered. 2. Mass effect on the left orbit and in particular on the optic nerve at the orbital apex. 3. Mild atherosclerotic changes at the carotid bifurcations bilaterally and within the cavernous internal carotid arteries bilaterally without significant stenosis. 4. No significant proximal stenosis, aneurysm, or branch vessel occlusion within the Circle of Willis. 5. Remote dens fracture. 6. Multilevel spondylosis of the cervical spine. 7. 1.4 cm right thyroid nodule. Not clinically significant; no follow-up imaging recommended (ref: J Am Coll Radiol. 2015 Feb;12(2): 143-50). 8. Aortic Atherosclerosis (ICD10-I70.0). Electronically Signed   By: San Morelle M.D.   On: 01/17/2020 20:49   CT Angio Neck W and/or Wo Contrast  Result Date: 01/17/2020 CLINICAL DATA:  Left-sided vision loss with progression over the last 7 months. EXAM: CT ANGIOGRAPHY HEAD AND NECK TECHNIQUE: Multidetector CT imaging of the head and neck was performed using the standard protocol during bolus administration of intravenous contrast. Multiplanar CT image reconstructions and MIPs were obtained to evaluate the vascular anatomy. Carotid stenosis measurements (when applicable) are obtained utilizing NASCET criteria, using the distal internal carotid diameter as the denominator. CONTRAST:  2m OMNIPAQUE IOHEXOL 350 MG/ML SOLN COMPARISON:  None. FINDINGS: CT HEAD FINDINGS Brain: Moderate atrophy and white matter disease is present. No acute infarct, hemorrhage, or mass lesion is present. Ventricles are proportionate to the degree of atrophy. No significant  after subtraction fluid collection is present. Vascular: Atherosclerotic calcifications are present within the cavernous internal carotid arteries and at the dural margin of the vertebral arteries. No hyperdense vessel present. Skull: Lucent lesions near the apex are stable and likely represent hemangiomas on the MRI. Sinuses: A soft tissue mass lesion is present in the posterior left ethmoid air cells and sphenoid sinus  measuring 5.1 x 2.7 x 1.8 cm. This extends to the orbital apex creates mass effect on the orbit. Lesion is better described on the MRI. Orbits: Mass effect noted in the left orbit. Bilateral lens replacements are present. Globes are otherwise normal. Tumor extends into the left orbital apex, impacting the left optic nerve. Review of the MIP images confirms the above findings CTA NECK FINDINGS Aortic arch: Atherosclerotic calcifications are present in the distal arch. Common origin is present the left common carotid artery and innominate artery. Right carotid system: Atherosclerotic changes are noted at the origin of the right common carotid artery and at the right carotid bifurcation without significant stenosis. Cervical ICA demonstrates moderate distal tortuosity without significant stenosis. Left carotid system: Carotid bifurcation demonstrates some atherosclerotic change. No significant stenosis is present. Mild tortuosity is present cervical left ICA. Vertebral arteries: Vertebral arteries originate from the subclavian arteries bilaterally without significant stenosis. The left vertebral artery is the dominant vessel. No significant stenosis is present in either vertebral artery in the neck. Skeleton: Remote dens fracture is noted. Degenerative changes are present throughout the cervical spine. Fusion is noted across the disc space C5-6. No focal lytic or blastic lesions are present. Slight anterolisthesis is present at C7-T1. A remote superior endplate fractures present at T4. Vertebral body  heights are otherwise maintained. Other neck: 1.4 cm nodule is present in the right lobe of the thyroid. Thyroid is otherwise unremarkable. No significant adenopathy is present. Salivary glands are within normal limits. No focal mucosal lesions are present. Upper chest: Lung apices are clear. Thoracic inlet is within normal limits. Review of the MIP images confirms the above findings CTA HEAD FINDINGS Anterior circulation: Atherosclerotic calcifications are present within the cavernous internal carotid arteries bilaterally without focal stenosis. The A1 and M1 segments are normal. MCA bifurcations are within normal limits. No significant proximal stenosis or occlusion is present. Posterior circulation: Atherosclerotic changes are present at the dural margin of both vertebral arteries without significant stenosis. PICA origin is visualized and normal. The basilar artery is normal. Both posterior cerebral arteries originate from the basilar tip. Venous sinuses: Dural sinuses are patent. Straight sinus deep cerebral veins are intact. Cortical veins are unremarkable. Anatomic variants: None Review of the MIP images confirms the above findings IMPRESSION: 1. 5.1 x 2.7 x 1.8 cm soft tissue mass lesion in the posterior left ethmoid air cells and sphenoid sinus extending to the left orbital apex, impacting the left optic nerve. This most likely represents a malignancy. Differential diagnosis includes sinonasal undifferentiated carcinoma or squamous cell carcinoma. Esthesioneuroblastoma is less likely. Metastatic disease is considered. 2. Mass effect on the left orbit and in particular on the optic nerve at the orbital apex. 3. Mild atherosclerotic changes at the carotid bifurcations bilaterally and within the cavernous internal carotid arteries bilaterally without significant stenosis. 4. No significant proximal stenosis, aneurysm, or branch vessel occlusion within the Circle of Willis. 5. Remote dens fracture. 6.  Multilevel spondylosis of the cervical spine. 7. 1.4 cm right thyroid nodule. Not clinically significant; no follow-up imaging recommended (ref: J Am Coll Radiol. 2015 Feb;12(2): 143-50). 8. Aortic Atherosclerosis (ICD10-I70.0). Electronically Signed   By: San Morelle M.D.   On: 01/17/2020 20:49   MR Brain W and Wo Contrast  Result Date: 01/17/2020 CLINICAL DATA:  Left-sided vision loss over the last several months. EXAM: MRI HEAD AND ORBITS WITHOUT AND WITH CONTRAST TECHNIQUE: Multiplanar, multiecho pulse sequences of the brain and surrounding structures were obtained without and with intravenous  contrast. Multiplanar, multiecho pulse sequences of the orbits and surrounding structures were obtained including fat saturation techniques, before and after intravenous contrast administration. CONTRAST:  45m GADAVIST GADOBUTROL 1 MMOL/ML IV SOLN COMPARISON:  CT head without contrast 02/23/2018 FINDINGS: MRI HEAD FINDINGS Brain: Moderate atrophy and white matter changes are present bilaterally. No acute infarct, hemorrhage or mass lesion is present. The ventricles are proportionate to the degree of atrophy. No significant extraaxial fluid collection is present. The internal auditory canals are within normal limits. The brainstem and cerebellum are within normal limits. Vascular: Flow is present in the major intracranial arteries. Skull and upper cervical spine: The craniocervical junction is normal. Remote dens fracture noted. Alignment is anatomic. Left frontal calvarial lesions are again noted, stable, likely hemangiomas. MRI ORBITS FINDINGS Orbits: Mass effect is noted on the left orbit and extending into the right orbital and extending into the left orbital apex. The optic nerve is within normal limits anterior to the apex. Globes are normal bilaterally. Rectus muscles are normal. Visualized sinuses: Enhancing soft tissue mass lesion is present in the posterior left ethmoid air cells measuring 5.1 x 3.3 x  2.2 cm. Lesion extends to the left orbital apex with mass effect on the left optic nerve. Lesion extends into the superior nasal cavity. Remaining paranasal sinuses and the mastoid air cells scratched at the remaining mastoid scratched at the remaining paranasal sinuses are clear. Fluid is present in left mastoid air cells. No obstructing nasopharyngeal lesion is present. Soft tissues: Soft tissues the face are unremarkable. IMPRESSION: 1. Enhancing soft tissue mass lesion in the posterior left ethmoid air cells with mass effect on the left optic nerve. This is most concerning for a neoplasm. Differential diagnosis includes sinonasal undifferentiated carcinoma and squamous cell carcinoma. Esthesioneuroblastoma is less likely to extend into the orbit than intracranial. Metastatic disease is also considered. 2. Mass effect on the left orbit with mass effect on the right optic nerve. 3. Moderate atrophy and white matter disease is present bilaterally likely reflects the sequela of chronic microvascular ischemia. 4. No acute intracranial abnormality. 5. Left mastoid effusion. No obstructing nasopharyngeal lesion is present. Electronically Signed   By: CSan MorelleM.D.   On: 01/17/2020 20:36   MR ORBITS W WO CONTRAST  Result Date: 01/17/2020 CLINICAL DATA:  Left-sided vision loss over the last several months. EXAM: MRI HEAD AND ORBITS WITHOUT AND WITH CONTRAST TECHNIQUE: Multiplanar, multiecho pulse sequences of the brain and surrounding structures were obtained without and with intravenous contrast. Multiplanar, multiecho pulse sequences of the orbits and surrounding structures were obtained including fat saturation techniques, before and after intravenous contrast administration. CONTRAST:  731mGADAVIST GADOBUTROL 1 MMOL/ML IV SOLN COMPARISON:  CT head without contrast 02/23/2018 FINDINGS: MRI HEAD FINDINGS Brain: Moderate atrophy and white matter changes are present bilaterally. No acute infarct,  hemorrhage or mass lesion is present. The ventricles are proportionate to the degree of atrophy. No significant extraaxial fluid collection is present. The internal auditory canals are within normal limits. The brainstem and cerebellum are within normal limits. Vascular: Flow is present in the major intracranial arteries. Skull and upper cervical spine: The craniocervical junction is normal. Remote dens fracture noted. Alignment is anatomic. Left frontal calvarial lesions are again noted, stable, likely hemangiomas. MRI ORBITS FINDINGS Orbits: Mass effect is noted on the left orbit and extending into the right orbital and extending into the left orbital apex. The optic nerve is within normal limits anterior to the apex. Globes are normal bilaterally. Rectus  muscles are normal. Visualized sinuses: Enhancing soft tissue mass lesion is present in the posterior left ethmoid air cells measuring 5.1 x 3.3 x 2.2 cm. Lesion extends to the left orbital apex with mass effect on the left optic nerve. Lesion extends into the superior nasal cavity. Remaining paranasal sinuses and the mastoid air cells scratched at the remaining mastoid scratched at the remaining paranasal sinuses are clear. Fluid is present in left mastoid air cells. No obstructing nasopharyngeal lesion is present. Soft tissues: Soft tissues the face are unremarkable. IMPRESSION: 1. Enhancing soft tissue mass lesion in the posterior left ethmoid air cells with mass effect on the left optic nerve. This is most concerning for a neoplasm. Differential diagnosis includes sinonasal undifferentiated carcinoma and squamous cell carcinoma. Esthesioneuroblastoma is less likely to extend into the orbit than intracranial. Metastatic disease is also considered. 2. Mass effect on the left orbit with mass effect on the right optic nerve. 3. Moderate atrophy and white matter disease is present bilaterally likely reflects the sequela of chronic microvascular ischemia. 4. No  acute intracranial abnormality. 5. Left mastoid effusion. No obstructing nasopharyngeal lesion is present. Electronically Signed   By: San Morelle M.D.   On: 01/17/2020 20:36    Procedures Procedures (including critical care time)  Medications Ordered in ED Medications  gadobutrol (GADAVIST) 1 MMOL/ML injection 7 mL (7 mLs Intravenous Contrast Given 01/17/20 1859)  iohexol (OMNIPAQUE) 350 MG/ML injection 75 mL (75 mLs Intravenous Contrast Given 01/17/20 1934)    ED Course  I have reviewed the triage vital signs and the nursing notes.  Pertinent labs & imaging results that were available during my care of the patient were reviewed by me and considered in my medical decision making (see chart for details).    MDM Rules/Calculators/A&P                          86 yo M with a chief complaint of left visual field loss.  Going on for about a week.  Seen by his ophthalmologist with concern for stroke versus vasculitis versus compression.  Recommended imaging of the brain and orbits and lab work including ESR and CRP and vascular studies.  Head imaging concerning for a mass originating from likely the sinuses compressing the optic nerve.  I discussed this with the patient.  At this point he would like to be discharged home and follow-up with his family doctor to discuss the results to see what would be best moving forward.  I discussed with him that this could make him lose his vision in that eye if not treated. I have also provided information for the ear nose and throat doctor on call for follow-up.  I did discuss coming into the hospital which the patient declined.   11:43 PM:  I have discussed the diagnosis/risks/treatment options with the patient and family and believe the pt to be eligible for discharge home to follow-up with PCP, ENT. We also discussed returning to the ED immediately if new or worsening sx occur. We discussed the sx which are most concerning (e.g., sudden worsening  pain, fever, inability to tolerate by mouth) that necessitate immediate return. Medications administered to the patient during their visit and any new prescriptions provided to the patient are listed below.  Medications given during this visit Medications  gadobutrol (GADAVIST) 1 MMOL/ML injection 7 mL (7 mLs Intravenous Contrast Given 01/17/20 1859)  iohexol (OMNIPAQUE) 350 MG/ML injection 75 mL (75 mLs Intravenous  Contrast Given 01/17/20 1934)     The patient appears reasonably screen and/or stabilized for discharge and I doubt any other medical condition or other [facility] requiring further screening, evaluation, or treatment in the ED at this time prior to discharge.        Final Clinical Impression(s) / ED Diagnoses Final diagnoses:  Mass of sinus    Rx / DC Orders ED Discharge Orders    None       Deno Etienne, DO 01/17/20 2343

## 2020-01-18 ENCOUNTER — Encounter: Payer: Self-pay | Admitting: Family Medicine

## 2020-01-18 ENCOUNTER — Other Ambulatory Visit (HOSPITAL_BASED_OUTPATIENT_CLINIC_OR_DEPARTMENT_OTHER): Payer: Medicare Other

## 2020-01-20 ENCOUNTER — Encounter: Payer: Self-pay | Admitting: Family Medicine

## 2020-01-21 ENCOUNTER — Other Ambulatory Visit: Payer: Self-pay

## 2020-01-21 ENCOUNTER — Ambulatory Visit (INDEPENDENT_AMBULATORY_CARE_PROVIDER_SITE_OTHER): Payer: Medicare Other

## 2020-01-21 ENCOUNTER — Telehealth: Payer: Self-pay

## 2020-01-21 DIAGNOSIS — H53132 Sudden visual loss, left eye: Secondary | ICD-10-CM

## 2020-01-21 NOTE — Telephone Encounter (Signed)
Appt scheduled for 10/15. Please provide diagnosis for OT/PT, HH aide

## 2020-01-21 NOTE — Telephone Encounter (Signed)
Noted  

## 2020-01-21 NOTE — Telephone Encounter (Signed)
Verbal orders given via VM 

## 2020-01-21 NOTE — Telephone Encounter (Addendum)
Received call from PT, [place] with [facility] to request referral for PT/OT and [facility] aide. Referral can be faxed to [phone]  [facility] for referral?

## 2020-01-21 NOTE — Telephone Encounter (Signed)
Yes, this is fine.  Also, pls call pt and ask him to schedule an in-person appt so we can discuss his recently detected sinus mass that is affecting his vision. I have touched base with Dr. Marin Olp about this issue. Just want to discuss the problem with pt and wife and see what his wishes are regarding the approach to his new problem. -thx

## 2020-01-21 NOTE — Telephone Encounter (Signed)
Attempted to call pt to sched appt. Unable to LVM

## 2020-01-22 ENCOUNTER — Other Ambulatory Visit: Payer: Self-pay | Admitting: Otolaryngology

## 2020-01-22 DIAGNOSIS — J322 Chronic ethmoidal sinusitis: Secondary | ICD-10-CM | POA: Diagnosis not present

## 2020-01-22 DIAGNOSIS — D385 Neoplasm of uncertain behavior of other respiratory organs: Secondary | ICD-10-CM | POA: Diagnosis not present

## 2020-01-22 LAB — CBC WITH DIFFERENTIAL/PLATELET
Absolute Monocytes: 688 cells/uL (ref 200–950)
Basophils Absolute: 72 cells/uL (ref 0–200)
Basophils Relative: 0.9 %
Eosinophils Absolute: 192 cells/uL (ref 15–500)
Eosinophils Relative: 2.4 %
HCT: 44.2 % (ref 38.5–50.0)
Hemoglobin: 14.5 g/dL (ref 13.2–17.1)
Lymphs Abs: 744 cells/uL — ABNORMAL LOW (ref 850–3900)
MCH: 29.4 pg (ref 27.0–33.0)
MCHC: 32.8 g/dL (ref 32.0–36.0)
MCV: 89.5 fL (ref 80.0–100.0)
MPV: 10.5 fL (ref 7.5–12.5)
Monocytes Relative: 8.6 %
Neutro Abs: 6304 cells/uL (ref 1500–7800)
Neutrophils Relative %: 78.8 %
Platelets: 210 10*3/uL (ref 140–400)
RBC: 4.94 10*6/uL (ref 4.20–5.80)
RDW: 13.4 % (ref 11.0–15.0)
Total Lymphocyte: 9.3 %
WBC: 8 10*3/uL (ref 3.8–10.8)

## 2020-01-22 LAB — COMPREHENSIVE METABOLIC PANEL
AG Ratio: 1.6 (calc) (ref 1.0–2.5)
ALT: 12 U/L (ref 9–46)
AST: 19 U/L (ref 10–35)
Albumin: 3.8 g/dL (ref 3.6–5.1)
Alkaline phosphatase (APISO): 70 U/L (ref 35–144)
BUN: 16 mg/dL (ref 7–25)
CO2: 24 mmol/L (ref 20–32)
Calcium: 9.4 mg/dL (ref 8.6–10.3)
Chloride: 96 mmol/L — ABNORMAL LOW (ref 98–110)
Creat: 1.06 mg/dL (ref 0.70–1.11)
Globulin: 2.4 g/dL (calc) (ref 1.9–3.7)
Glucose, Bld: 149 mg/dL — ABNORMAL HIGH (ref 65–99)
Potassium: 4.3 mmol/L (ref 3.5–5.3)
Sodium: 133 mmol/L — ABNORMAL LOW (ref 135–146)
Total Bilirubin: 0.6 mg/dL (ref 0.2–1.2)
Total Protein: 6.2 g/dL (ref 6.1–8.1)

## 2020-01-22 LAB — C-REACTIVE PROTEIN: CRP: 3.5 mg/L (ref <8.0)

## 2020-01-22 LAB — SEDIMENTATION RATE: Sed Rate: 6 mm/h (ref 0–20)

## 2020-01-22 NOTE — Progress Notes (Signed)
Patient's son called back with questions regarding COVID test stating patient lived in independent living and did not have transportation to get to drive thru. Made son aware that patient could be tested DOS at [facility] prior to surgery. Son also had concerns regarding general anesthesia. Encouraged son to reach out to Dr. Benjamine Mola tomorrow to get clarification of procedure and plan of care for his father. Verbalized understanding and stated he would call Dr. Deeann Saint office tomorrow.

## 2020-01-22 NOTE — Progress Notes (Signed)
PCP:  Shawnie Dapper, MD Cardiologist:  Denies  EKG:  01/20/20 CXR:  05/10/18 ECHO:  03/15/16 Stress Test:  Denies Cardiac Cath:  Denies  Covid test:  DOS  Ansthesia Review:  Yes, abnormal EKG, advanced age.   Patient denies shortness of breath, fever, cough, and chest pain at PAT appointment.  Patient verbalized understanding of instructions provided today at the PAT appointment.  Patient asked to review instructions at home and day of surgery.

## 2020-01-23 ENCOUNTER — Telehealth: Payer: Self-pay

## 2020-01-23 NOTE — Telephone Encounter (Signed)
No records for ENT found in care everywhere. Will wait for records via fax

## 2020-01-23 NOTE — Progress Notes (Signed)
Anesthesia Chart Review: Brendan Gonzalez   Case: [id number] Date/Time: 01/24/20 1030   Procedure: ETHMOIDECTOMY AND BIOPSY OF MASS (Left )   Anesthesia type: General   Pre-op diagnosis: ETHMOID MASS   Location: [facility] OR ROOM 09 / [place] OR   Surgeons: Brendan Baptist, MD      DISCUSSION: Patient is a 86 year old male scheduled for the above procedure. He was evaluated in the ED on 01/17/20 for left sided vision loss going on for about a week or so. His ophthalmologist recommended ED for further evaluation and to rule out stroke. Imaging revealed a mass likely originating from the sinuses and compressing the optic nerve. The patient declined further work-up in-patient, so ENT and PCP follow-up advised. Notes suggest Dr. Anitra Gonzalez also updated oncologist Dr. Marin Gonzalez.   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   Patient is a same day work-up. He lives in independent living and does not have transportation to get to the drive thru, so he will get COVID-19 testing on the day of surgery.    VS:   BP Readings from Last 3 Encounters:  01/17/20 (!) 178/80  11/14/19 (!) 159/97  06/12/19 (!) 150/81   Pulse Readings from Last 3 Encounters:  01/17/20 66  11/14/19 (!) 57  06/12/19 (!) 57    PROVIDERS: Brendan Gonzalez, Brendan Blackwater, MD is PCP  Brendan Gauze, MD is HEM-ONC - He is not followed by cardiology, but was evaluated by Brendan Grayer, MD on 03/16/16 for consideration of loop recorder in setting of acute CVA. Ultimately, he did not get a loop  recorder.  LABS: Lab results as of 01/21/20: Lab Results  Component Value Date   WBC 8.0 01/21/2020   HGB 14.5 01/21/2020   HCT 44.2 01/21/2020   PLT 210 01/21/2020   GLUCOSE 149 (H) 01/21/2020   ALT 12 01/21/2020   AST 19 01/21/2020   NA 133 (L) 01/21/2020   K 4.3 01/21/2020   CL 96 (L) 01/21/2020   CREATININE 1.06 01/21/2020   BUN 16 01/21/2020   CO2 24 01/21/2020     IMAGES: CTA head/neck 01/17/20: IMPRESSION: 1. 5.1 x 2.7 x 1.8 cm soft tissue mass lesion in the posterior left ethmoid air cells and sphenoid sinus extending to the left orbital apex, impacting the left optic nerve. This most likely represents a malignancy. Differential diagnosis includes sinonasal undifferentiated carcinoma or squamous cell carcinoma. Esthesioneuroblastoma is less likely. Metastatic disease is considered. 2. Mass effect on the left orbit and in particular on the optic nerve at the orbital apex. 3. Mild atherosclerotic changes at the carotid bifurcations bilaterally and within the cavernous internal carotid arteries bilaterally without significant stenosis. 4. No significant proximal stenosis, aneurysm, or branch vessel occlusion within the Circle of Willis. 5. Remote dens fracture. 6. Multilevel spondylosis of the cervical spine. 7. 1.4 cm right thyroid nodule. Not clinically significant; no follow-up imaging recommended (ref: Brendan Gonzalez. 2015 Feb;12(2): 143-50). 8. Aortic Atherosclerosis (ICD10-I70.0).  MRI Brain and Orbits 01/17/20: IMPRESSION: 1. Enhancing soft tissue mass lesion in the posterior left ethmoid air cells with mass effect on the left optic  nerve. This is most concerning for a neoplasm. Differential diagnosis includes sinonasal undifferentiated carcinoma and squamous cell carcinoma. Esthesioneuroblastoma is less likely to extend into the orbit than intracranial. Metastatic disease is also considered. 2. Mass effect on the left orbit with mass effect on the  right optic nerve. 3. Moderate atrophy and white matter disease is present bilaterally likely reflects the sequela of chronic microvascular ischemia. 4. No acute intracranial abnormality. 5. Left mastoid effusion. No obstructing nasopharyngeal lesion is Present.   EKG: 01/17/20:  Sinus rhythm Supraventricular bigeminy Consider anterior infarct No significant change since last tracing Confirmed by Brendan Gonzalez [phone]) on 01/17/2020 4:57:09 PM   CV: Echo 03/15/16: Study Conclusions  - Left ventricle: The cavity size was normal. Systolic function was  normal. Wall motion was normal; there were no regional wall  motion abnormalities. There was an increased relative  contribution of atrial contraction to ventricular filling.  Doppler parameters are consistent with abnormal left ventricular  relaxation (grade 1 diastolic dysfunction).  - Aortic valve: There was trivial regurgitation.  - Aorta: Aortic root dimension: 45 mm (ED). Ascending aortic  diameter: 38 mm (S).  - Aortic root: The aortic root was moderately dilated.  - Ascending aorta: The ascending aorta was mildly dilated.   Carotid US 03/15/16: Summary:  Findings suggest 1-39% internal carotid artery stenosis  bilaterally. Vertebral arteries are patent with antegrade flow.    Past Medical History:  Diagnosis Date  . C2 cervical fracture (Brendan Gonzalez) 07/2017   Type II morphology with posterior displacement--C collar (potentially indefinitely).  . Chronic lower back pain   . Chronic renal insufficiency, stage III (moderate) (HCC)    GFR 50s-60s  . Debilitated patient   . Diffuse large cell non-Hodgkin's lymphoma (Brendan Gonzalez) 12/15/2017   B cell NHL --osseous involvement only.  RT 12/2017 helped with his pain; pt is to get chemo 01/2018.  PET 03/06/18: good response to tx.  No further treatment or imaging done.  . Generalized weakness   . Goals of care, counseling/discussion 12/15/2017  . History of colon cancer 1993  . History  of CVA (cerebrovascular accident)    Left MCA territory.  Carotid dopplers ok, Brain MRA ok, ECHO with grd I DD.  As of 02/2018->no ASA or plavix or statin due to pt getting chemo and RT + the related alteration in goals of care.  . Hypertension   . Lumbar spinal stenosis    Surgery 2012 helped but in 2015 pain has returned  . Lytic bone lesions on xray 08/2017   Skull, mildly abnormal bone scan. SPEP/UPEP w/out monoclonal spike.  Dr. Marin Gonzalez eval 08/2017. Lytic lesions worsened-> Sacrum bx->large B cell lymphoma->XRT +XGEVA and Rituxan/bendamustine (completed 3 of 6 planned cycles--last one 03/2018).  . Mass of sinus    L ethmoid->extending into orbit and exerting mass effect on L>R optic nerves  . Osteoarthritis   . Self-care deficit   . Sudden loss of vision, left 12/2019   Dr. Posey Pronto at [facility] eye-->exam ok-->retinal specialist->ok-->MR brain/maxillofacial->L ethmoid/sphenoid sinus mass invading orbits, compressing L>R optic nerve  . Unstable gait   . Venous insufficiency of both lower extremities    made worse by amlodipine  . Vision loss of left eye 12/2019   MRI showed L sinus mass with mass effect on L>R optic nerves    Past Surgical History:  Procedure Laterality Date  . BONE BIOPSY  12/2017   sacrum->large B cell lymphoma (NHL)  . CHOLECYSTECTOMY  1984  . INGUINAL HERNIA REPAIR  1974   [place]ilat  . LUMBAR SPINE SURGERY  2012   for spinal stenosis (Dr. Jonelle [place] in [place])  . LUNG LOBECTOMY  1993   LLL per pt--worry of possible cancer but turned out to be benign.  [name] PARTIAL COLECTOMY  1993   for colon cancer  . TRANSTHORACIC ECHOCARDIOGRAM  03/15/2016   Normal EF, grd I DD, mod dil aortic root    MEDICATIONS: No current facility-administered medications for this encounter.   [name] atenolol (TENORMIN) 100 MG tablet  . cholecalciferol (VITAMIN D) 1000 UNITS tablet  . escitalopram (LEXAPRO) 10 MG tablet  . vitamin C (ASCORBIC ACID) 500 MG tablet  . vitamin E 400 UNIT capsule     Myra Gianotti, PA-C Surgical Short Stay/Anesthesiology [facility] Phone [phone] [facility] Phone [phone] 01/23/2020 11:16 AM

## 2020-01-23 NOTE — Telephone Encounter (Signed)
Received call from [place] with Dr.Patel's office to see if patient was made aware of imaging results from 10/8. Notified her unsure if patient was made aware but nothing documented in chart regarding this. Unsure if Dr.Patel will be calling you. Sonia Baller was going to call the patient this morning to follow up though.

## 2020-01-23 NOTE — Anesthesia Preprocedure Evaluation (Addendum)
Anesthesia Evaluation  Patient identified by MRN, date of birth, ID band Patient awake    Reviewed: Allergy & Precautions, NPO status , Patient's Chart, lab work & pertinent test results  Airway Mallampati: III  TM Distance: >3 FB Neck ROM: Limited    Dental  (+) Dental Advisory Given   Pulmonary former smoker,    breath sounds clear to auscultation       Cardiovascular hypertension, Pt. on medications  Rhythm:Regular Rate:Normal     Neuro/Psych  Neuromuscular disease CVA    GI/Hepatic negative GI ROS, Neg liver ROS,   Endo/Other  negative endocrine ROS  Renal/GU CRFRenal disease     Musculoskeletal  (+) Arthritis ,   Abdominal   Peds  Hematology negative hematology ROS (+)   Anesthesia Other Findings   Reproductive/Obstetrics                            Anesthesia Physical Anesthesia Plan  ASA: IV  Anesthesia Plan: MAC   Post-op Pain Management:    Induction:   PONV Risk Score and Plan: 1 and Propofol infusion, Ondansetron and Treatment may vary due to age or medical condition  Airway Management Planned: Natural Airway and Nasal Cannula  Additional Equipment:   Intra-op Plan:   Post-operative Plan:   Informed Consent: I have reviewed the patients History and Physical, chart, labs and discussed the procedure including the risks, benefits and alternatives for the proposed anesthesia with the patient or authorized representative who has indicated his/her understanding and acceptance.       Plan Discussed with:   Anesthesia Plan Comments: (PAT note written 01/23/2020 by Myra Gianotti, PA-C. SAME DAY WORK-UP   )       Anesthesia Quick Evaluation

## 2020-01-23 NOTE — Telephone Encounter (Signed)
When he had his MRI in the ED, the provider told him his results. He has apparently already seen ENT MD-->Dr. Benjamine Mola, and it looks like he is being scheduled to get a biopsy. I have already asked Hinton Dyer to request ENT records (yesterday), but I did not look in care everywhere---will you take a look?-thx

## 2020-01-24 ENCOUNTER — Other Ambulatory Visit: Payer: Self-pay

## 2020-01-24 ENCOUNTER — Ambulatory Visit: Payer: Medicare Other | Admitting: Family Medicine

## 2020-01-24 ENCOUNTER — Ambulatory Visit (HOSPITAL_COMMUNITY): Payer: Medicare Other | Admitting: Vascular Surgery

## 2020-01-24 ENCOUNTER — Encounter (HOSPITAL_COMMUNITY)
Admission: RE | Disposition: A | Discharge status: Home / Self Care | Payer: Self-pay | Source: Home / Self Care | Attending: Otolaryngology

## 2020-01-24 ENCOUNTER — Encounter (HOSPITAL_COMMUNITY): Payer: Self-pay | Admitting: Otolaryngology

## 2020-01-24 ENCOUNTER — Ambulatory Visit (HOSPITAL_COMMUNITY)
Admission: RE | Admit: 2020-01-24 | Discharge: 2020-01-24 | Disposition: A | Discharge status: Home / Self Care | Payer: Medicare Other | Attending: Otolaryngology | Admitting: Otolaryngology

## 2020-01-24 DIAGNOSIS — Z87891 Personal history of nicotine dependence: Secondary | ICD-10-CM | POA: Insufficient documentation

## 2020-01-24 DIAGNOSIS — N183 Chronic kidney disease, stage 3 unspecified: Secondary | ICD-10-CM | POA: Diagnosis not present

## 2020-01-24 DIAGNOSIS — N179 Acute kidney failure, unspecified: Secondary | ICD-10-CM | POA: Diagnosis not present

## 2020-01-24 DIAGNOSIS — J3489 Other specified disorders of nose and nasal sinuses: Secondary | ICD-10-CM | POA: Diagnosis not present

## 2020-01-24 DIAGNOSIS — I872 Venous insufficiency (chronic) (peripheral): Secondary | ICD-10-CM | POA: Diagnosis not present

## 2020-01-24 DIAGNOSIS — H5462 Unqualified visual loss, left eye, normal vision right eye: Secondary | ICD-10-CM | POA: Insufficient documentation

## 2020-01-24 DIAGNOSIS — I129 Hypertensive chronic kidney disease with stage 1 through stage 4 chronic kidney disease, or unspecified chronic kidney disease: Secondary | ICD-10-CM | POA: Diagnosis not present

## 2020-01-24 DIAGNOSIS — M1990 Unspecified osteoarthritis, unspecified site: Secondary | ICD-10-CM | POA: Diagnosis not present

## 2020-01-24 DIAGNOSIS — Z8673 Personal history of transient ischemic attack (TIA), and cerebral infarction without residual deficits: Secondary | ICD-10-CM | POA: Diagnosis not present

## 2020-01-24 DIAGNOSIS — Z85038 Personal history of other malignant neoplasm of large intestine: Secondary | ICD-10-CM | POA: Insufficient documentation

## 2020-01-24 DIAGNOSIS — R5381 Other malaise: Secondary | ICD-10-CM | POA: Insufficient documentation

## 2020-01-24 DIAGNOSIS — R4781 Slurred speech: Secondary | ICD-10-CM | POA: Diagnosis not present

## 2020-01-24 DIAGNOSIS — Z79899 Other long term (current) drug therapy: Secondary | ICD-10-CM | POA: Insufficient documentation

## 2020-01-24 DIAGNOSIS — Z20822 Contact with and (suspected) exposure to covid-19: Secondary | ICD-10-CM | POA: Insufficient documentation

## 2020-01-24 DIAGNOSIS — J310 Chronic rhinitis: Secondary | ICD-10-CM | POA: Insufficient documentation

## 2020-01-24 DIAGNOSIS — J322 Chronic ethmoidal sinusitis: Secondary | ICD-10-CM | POA: Diagnosis not present

## 2020-01-24 DIAGNOSIS — R531 Weakness: Secondary | ICD-10-CM | POA: Diagnosis not present

## 2020-01-24 DIAGNOSIS — Z8572 Personal history of non-Hodgkin lymphomas: Secondary | ICD-10-CM | POA: Diagnosis not present

## 2020-01-24 DIAGNOSIS — D385 Neoplasm of uncertain behavior of other respiratory organs: Secondary | ICD-10-CM | POA: Diagnosis not present

## 2020-01-24 HISTORY — PX: ETHMOIDECTOMY: SHX5197

## 2020-01-24 LAB — SARS CORONAVIRUS 2 BY RT PCR (HOSPITAL ORDER, PERFORMED IN ~~LOC~~ HOSPITAL LAB): SARS Coronavirus 2: NEGATIVE

## 2020-01-24 SURGERY — ETHMOIDECTOMY
Anesthesia: Monitor Anesthesia Care | Site: Nose | Laterality: Left

## 2020-01-24 MED ORDER — SODIUM CHLORIDE 0.9 % IV SOLN
0.0125 ug/kg/min | INTRAVENOUS | Status: AC
  Administered 2020-01-24: .01 ug/kg/min via INTRAVENOUS
  Filled 2020-01-24: qty 2000

## 2020-01-24 MED ORDER — FENTANYL CITRATE (PF) 250 MCG/5ML IJ SOLN
INTRAMUSCULAR | Status: AC
  Filled 2020-01-24: qty 5

## 2020-01-24 MED ORDER — HYDROCODONE-ACETAMINOPHEN 5-325 MG PO TABS: 1.0000 | ORAL_TABLET | Freq: Four times a day (QID) | ORAL | 0 refills | Status: AC | PRN

## 2020-01-24 MED ORDER — BACITRACIN-NEOMYCIN-POLYMYXIN OINTMENT TUBE
TOPICAL_OINTMENT | CUTANEOUS | Status: AC
  Filled 2020-01-24: qty 14.17

## 2020-01-24 MED ORDER — LIDOCAINE HCL URETHRAL/MUCOSAL 2 % EX GEL
CUTANEOUS | Status: DC | PRN
  Administered 2020-01-24: 1 via TOPICAL

## 2020-01-24 MED ORDER — CHLORHEXIDINE GLUCONATE 0.12 % MT SOLN
15.0000 mL | OROMUCOSAL | Status: AC
  Administered 2020-01-24: 15 mL via OROMUCOSAL
  Filled 2020-01-24 (×2): qty 15

## 2020-01-24 MED ORDER — LIDOCAINE-EPINEPHRINE 1 %-1:100000 IJ SOLN
INTRAMUSCULAR | Status: AC
  Filled 2020-01-24: qty 1

## 2020-01-24 MED ORDER — HYDRALAZINE HCL 20 MG/ML IJ SOLN: 10.0000 mg | INTRAMUSCULAR | Status: AC

## 2020-01-24 MED ORDER — LIDOCAINE-EPINEPHRINE 1 %-1:100000 IJ SOLN
INTRAMUSCULAR | Status: DC | PRN
  Administered 2020-01-24: 1 mL

## 2020-01-24 MED ORDER — ATENOLOL 100 MG PO TABS
100.0000 mg | ORAL_TABLET | ORAL | Status: AC
  Administered 2020-01-24: 100 mg via ORAL
  Filled 2020-01-24: qty 1

## 2020-01-24 MED ORDER — PROPOFOL 500 MG/50ML IV EMUL
INTRAVENOUS | Status: DC | PRN
  Administered 2020-01-24: 10 ug/kg/min via INTRAVENOUS

## 2020-01-24 MED ORDER — LACTATED RINGERS IV SOLN: INTRAVENOUS | Status: DC

## 2020-01-24 MED ORDER — BACITRACIN-NEOMYCIN-POLYMYXIN OINTMENT TUBE
TOPICAL_OINTMENT | CUTANEOUS | Status: DC | PRN
  Administered 2020-01-24: 1 via TOPICAL

## 2020-01-24 MED ORDER — STERILE WATER FOR IRRIGATION IR SOLN
Status: DC | PRN
  Administered 2020-01-24: 1000 mL

## 2020-01-24 MED ORDER — CEFAZOLIN SODIUM-DEXTROSE 2-3 GM-%(50ML) IV SOLR
INTRAVENOUS | Status: DC | PRN
  Administered 2020-01-24: 2 g via INTRAVENOUS

## 2020-01-24 MED ORDER — HYDRALAZINE HCL 20 MG/ML IJ SOLN
INTRAMUSCULAR | Status: AC
  Administered 2020-01-24: 10 mg via INTRAVENOUS
  Filled 2020-01-24: qty 1

## 2020-01-24 MED ORDER — COCAINE HCL 4 % EX SOLN
CUTANEOUS | Status: DC | PRN
  Administered 2020-01-24: 4 mL via NASAL

## 2020-01-24 MED ORDER — AMOXICILLIN 875 MG PO TABS: 875.0000 mg | ORAL_TABLET | Freq: Two times a day (BID) | ORAL | 0 refills | Status: DC

## 2020-01-24 MED ORDER — AMOXICILLIN 875 MG PO TABS: 875.0000 mg | ORAL_TABLET | Freq: Two times a day (BID) | ORAL | 0 refills | Status: AC

## 2020-01-24 MED ORDER — 0.9 % SODIUM CHLORIDE (POUR BTL) OPTIME
TOPICAL | Status: DC | PRN
  Administered 2020-01-24: 1000 mL

## 2020-01-24 MED ORDER — ONDANSETRON HCL 4 MG/2ML IJ SOLN
INTRAMUSCULAR | Status: DC | PRN
  Administered 2020-01-24: 4 mg via INTRAVENOUS

## 2020-01-24 MED ORDER — HYDROCODONE-ACETAMINOPHEN 5-325 MG PO TABS
1.0000 | ORAL_TABLET | Freq: Four times a day (QID) | ORAL | 0 refills | Status: DC | PRN
Start: 2020-01-24 — End: 2020-01-24

## 2020-01-24 MED ORDER — COCAINE HCL 4 % EX SOLN
CUTANEOUS | Status: AC
  Filled 2020-01-24: qty 4

## 2020-01-24 SURGICAL SUPPLY — 28 items
BLADE RAD40 ROTATE 4M 4 5PK (BLADE) IMPLANT
BLADE RAD60 ROTATE M4 4 5PK (BLADE) IMPLANT
BLADE SURG 15 STRL LF DISP TIS (BLADE) IMPLANT
BLADE SURG 15 STRL SS (BLADE)
CANISTER SUCT 3000ML PPV (MISCELLANEOUS) ×2 IMPLANT
COAGULATOR SUCT SWTCH 10FR 6 (ELECTROSURGICAL) IMPLANT
COVER WAND RF STERILE (DRAPES) ×2 IMPLANT
DRAPE HALF SHEET 40X57 (DRAPES) IMPLANT
DRSG NASOPORE 8CM (GAUZE/BANDAGES/DRESSINGS) ×2 IMPLANT
ELECT REM PT RETURN 9FT ADLT (ELECTROSURGICAL)
ELECTRODE REM PT RTRN 9FT ADLT (ELECTROSURGICAL) IMPLANT
GAUZE SPONGE 2X2 8PLY STRL LF (GAUZE/BANDAGES/DRESSINGS) ×1 IMPLANT
GLOVE ECLIPSE 7.5 STRL STRAW (GLOVE) ×2 IMPLANT
GOWN STRL REUS W/ TWL LRG LVL3 (GOWN DISPOSABLE) ×2 IMPLANT
GOWN STRL REUS W/TWL LRG LVL3 (GOWN DISPOSABLE) ×4
KIT BASIN OR (CUSTOM PROCEDURE TRAY) ×2 IMPLANT
KIT TURNOVER KIT B (KITS) ×2 IMPLANT
NEEDLE HYPO 25GX1X1/2 BEV (NEEDLE) IMPLANT
NEEDLE SPNL 25GX3.5 QUINCKE BL (NEEDLE) ×2 IMPLANT
NS IRRIG 1000ML POUR BTL (IV SOLUTION) ×2 IMPLANT
PAD ARMBOARD 7.5X6 YLW CONV (MISCELLANEOUS) ×4 IMPLANT
SPONGE GAUZE 2X2 STER 10/PKG (GAUZE/BANDAGES/DRESSINGS) ×1
SPONGE NEURO XRAY DETECT 1X3 (DISPOSABLE) ×2 IMPLANT
TOWEL GREEN STERILE FF (TOWEL DISPOSABLE) ×2 IMPLANT
TRAY ENT MC OR (CUSTOM PROCEDURE TRAY) ×2 IMPLANT
TUBE SALEM SUMP 16 FR W/ARV (TUBING) ×2 IMPLANT
TUBING EXTENTION W/L.L. (IV SETS) ×2 IMPLANT
WATER STERILE IRR 1000ML POUR (IV SOLUTION) ×2 IMPLANT

## 2020-01-24 NOTE — Anesthesia Postprocedure Evaluation (Signed)
Anesthesia Post Note  Patient: Brendan Gonzalez  Procedure(s) Performed: ETHMOIDECTOMY AND BIOPSY OF MASS (Left Nose)     Patient location during evaluation: PACU Anesthesia Type: MAC Level of consciousness: awake and alert Pain management: pain level controlled Vital Signs Assessment: post-procedure vital signs reviewed and stable Respiratory status: spontaneous breathing, nonlabored ventilation, respiratory function stable and patient connected to nasal cannula oxygen Cardiovascular status: stable and blood pressure returned to baseline Postop Assessment: no apparent nausea or vomiting Anesthetic complications: no   No complications documented.  Last Vitals:  Vitals:   01/24/20 1130 01/24/20 1145  BP: (!) 165/98 129/81  Pulse: 74 70  Resp: 17 12  Temp: (!) 36.3 C   SpO2: 97% 96%    Last Pain:  Vitals:   01/24/20 1130  TempSrc:   PainSc: 0-No pain                 Tiajuana Amass

## 2020-01-24 NOTE — Op Note (Signed)
DATE OF PROCEDURE: 01/24/2020  OPERATIVE REPORT   SURGEON: Leta Baptist, MD   PREOPERATIVE DIAGNOSES:  1. Left posterior ethmoid mass.  POSTOPERATIVE DIAGNOSES:  1. Left posterior ethmoid mass.  PROCEDURE PERFORMED:  1. Left endoscopic anterior and posterior ethmoidectomy and biopsy of ethmoid mass.  ANESTHESIA: Local anesthesia with IV sedation.   COMPLICATIONS: None.   ESTIMATED BLOOD LOSS: 10 ml.   INDICATION FOR PROCEDURE: Brendan Gonzalez is a 86 y.o. male who noted worsening of his left eyesight for the past several months. He was recently seen at [facility] His MRI showed a large left ethmoid sinus mass with mass effect on his left optic nerve. The mass involves the posterior left ethmoid air cells measuring 5.1 x 3.3 x 2.2 cm. Findings are concerning for neoplasm/malignancy. Based on the above findings, the decision was made for the patient to undergo the above-stated procedure. The risks, benefits, alternatives, and details of the procedures were discussed with the patient and his son. Due to his advanced age, the decision is made to perform the surgery un IV sedation. Questions were invited and answered. Informed consent was obtained.   DESCRIPTION OF PROCEDURE: The patient was taken to the operating room and placed supine on the operating table. IV sedation was administered by the anesthesiologist. The patient was positioned, and prepped and draped in the standard fashion for nasal surgery. Pledgets soaked with cocaine and lidocaine were placed in the left nasal cavity. The pledgets were subsequently removed.   Using a 0 endoscope, the left nasal cavity was examined. The left middle turbinate was medialized. The anterior ethmoid wall was removed.  A copious amount of mucoid fluid was noted to be draining from the ethmoid cavities. The ethmoid mass was noted to be cystic. The drainage was suctioned. The posterior ethmoid walls were also removed.  Multiple biopsy specimens were obtained  from the ethmoid mass. Hemostasis was achieved with nasopore packing.  The care of the patient was turned over to the anesthesiologist. The patient was transferred to the recovery room in good condition.   OPERATIVE FINDINGS: Cystic left ethmoid mass. A large amount of mucoid fluid was drained.  SPECIMEN: Left ethmoid mass biopsy specimens.  FOLLOWUP CARE: The patient be discharged home once he is awake and alert.  The patient will follow up in my office in 1 week.  Brain Honeycutt Raynelle Bring, MD

## 2020-01-24 NOTE — Discharge Instructions (Addendum)
POSTOPERATIVE INSTRUCTIONS FOR PATIENTS HAVING NASAL OR SINUS OPERATIONS ACTIVITY: Restrict activity at home for the first two days, resting as much as possible. Light activity is best. You may usually return to work within a week. You should refrain from nose blowing, strenuous activity, or heavy lifting greater than 20lbs for a total of one week after your operation.  If sneezing cannot be avoided, sneeze with your mouth open. DISCOMFORT: You may experience a dull headache and pressure along with nasal congestion and discharge. These symptoms may be worse during the first week after the operation but may last as long as two to four weeks.  Please take Tylenol or the pain medication that has been prescribed for you. Do not take aspirin or aspirin containing medications since they may cause bleeding.  You may experience symptoms of post nasal drainage, nasal congestion, headaches and fatigue for two or three months after your operation.  BLEEDING: You may have some blood tinged nasal drainage for approximately two weeks after the operation.  The discharge will be worse for the first week.  Please call our office at [phone] or go to the nearest hospital emergency room if you experience any of the following: heavy, bright red blood from your nose or mouth that lasts longer than 15 minutes or coughing up or vomiting bright red blood or blood clots. GENERAL CONSIDERATIONS: 1. A gauze dressing will be placed on your upper lip to absorb any drainage after the operation. You may need to change this several times a day.  If you do not have very much drainage, you may remove the dressing.  Remember that you may gently wipe your nose with a tissue and sniff in, but DO NOT blow your nose. 2. Please keep all of your postoperative appointments.  Your final results after the operation will depend on proper follow-up.  The initial visit is usually 2 to 5 days after the operation.  During this visit, the remaining nasal  packing and internal septal splints will be removed.  Your nasal and sinus cavities will be cleaned.  During the second visit, your nasal and sinus cavities will be cleaned again. Have someone drive you to your first two postoperative appointments.  3. How you care for your nose after the operation will influence the results that you obtain.  You should follow all directions, take your medication as prescribed, and call our office [phone] with any problems or questions. 4. You may be more comfortable sleeping with your head elevated on two pillows. 5. Do not take any medications that we have not prescribed or recommended. WARNING SIGNS: if any of the following should occur, please call our office: 1. Persistent fever greater than 102F. 2. Persistent vomiting. 3. Severe and constant pain that is not relieved by prescribed pain medication. 4. Trauma to the nose. 5. Rash or unusual side effects from any medicines.  

## 2020-01-24 NOTE — Transfer of Care (Signed)
Immediate Anesthesia Transfer of Care Note  Patient: Brendan Gonzalez  Procedure(s) Performed: ETHMOIDECTOMY AND BIOPSY OF MASS (Left Nose)  Patient Location: PACU  Anesthesia Type:MAC  Level of Consciousness: awake, alert  and oriented  Airway & Oxygen Therapy: Patient Spontanous Breathing  Post-op Assessment: Report given to RN and Post -op Vital signs reviewed and stable  Post vital signs: Reviewed and stable  Last Vitals:  Vitals Value Taken Time  BP 165/98 01/24/20 1130  Temp    Pulse 73 01/24/20 1132  Resp 17 01/24/20 1132  SpO2 100 % 01/24/20 1132  Vitals shown include unvalidated device data.  Last Pain:  Vitals:   01/24/20 0846  TempSrc:   PainSc: 0-No pain         Complications: No complications documented.

## 2020-01-24 NOTE — H&P (Signed)
Cc: Sinus mass  HPI: The patient is a 86 y/o male who presents today with a neighbor for evaluation of a sinus mass. The patient is seen in consultation requested by [facility]. The patient has noted worsening of his left eyesight for the past several months. He was recently seen at [facility] with MRI showing a large left ethmoid sinus mass with mass effect on his left optic nerve. The mass involves the posterior left ethmoid air cells measuring 5.1 x 3.3 x 2.2 cm. Findings are concerning for neoplasm/malignancy. The patient denies nasal congestion, facial pain, or pressure. He is currently living at home alone with his wife. His son is scheduled to come into town in a few days. The patient has had colon cancer and lymphoma in the past. Previous ENT surgery is denied.   The patient's review of systems (constitutional, eyes, ENT, cardiovascular, respiratory, GI, musculoskeletal, skin, neurologic, psychiatric, endocrine, hematologic, allergic) is noted in the ROS questionnaire.  It is reviewed with the patient and his neighbor.   Family health history: Heart attack, cancer, Parkinson's disease.  Major events: Gallbladder removed, hernia repair, lumbar spine surgery, lung lobectomy, colon cancer, CVA.  Ongoing medical problems: Hypertension, CKD stage 3, blurred vision, slurred speech, Non-Hodgkin's Lymphoma, CVA, colon cancer.  Social history: The patient is married. He is a former smoker. He denies the use of alcohol or illegal drugs.   Exam: General: Communicates without difficulty, well nourished, no acute distress. Head: Normocephalic, no evidence injury, no tenderness, facial buttresses intact without stepoff. Eyes: PERRL, EOMI. No scleral icterus, conjunctivae clear. Neuro: No nystagmus at any point of gaze. Ears: Auricles well formed without lesions.  Ear canals are intact without mass or lesion.  No erythema or edema is appreciated.  The TMs are intact without fluid. Nose: External  evaluation reveals normal support and skin without lesions.  Dorsum is intact.  Anterior rhinoscopy reveals congested and edematous mucosa over anterior aspect of the inferior turbinates and nasal septum.  No purulence is noted. Middle meatus is not well visualized. Oral:  Oral cavity and oropharynx are intact, symmetric, without erythema or edema.  Mucosa is moist without lesions. Neck: Full range of motion without pain.  There is no significant lymphadenopathy.  No masses palpable.  Thyroid bed within normal limits to palpation.  Parotid glands and submandibular glands equal bilaterally without mass.  Trachea is midline. Neuro:  CN 2-12 grossly intact. Gait normal. Vestibular: No nystagmus at any point of gaze.   Procedure: Flexible Nasal Endoscopy Description: Risks, benefits, and alternatives of flexible endoscopy were explained to the patient. The patient gave oral consent to proceed.  The flexible scope was inserted into the right nasal cavity. Endoscopy of the interior nasal cavity, superior, inferior, and middle meatus was performed. The sphenoid-ethmoid recess was examined. Edematous mucosa was noted. No polyp, mass, or lesion was appreciated. Olfactory cleft was clear. Nasopharynx was clear. Turbinates were hypertrophied but without mass. The procedure was repeated on the contralateral side with similar findings. The patient tolerated the procedure well.   Assessment 1. The patient has a large soft tissue mass in the posterior left ethmoid air cells, extending to the left orbital apex, impacting the left optic nerve. Findings are concerning for neoplasm/malignancy.  The mass is not visible on today's nasal endoscopy exam. 2. Chronic rhinitis with nasal mucosal congestion, but without evidence of acute sinusitis. No purulent drainage or other suspicious mass or lesion is noted on today's nasal endoscopy.  Plan  1. The physical exam, endoscopy, and imaging findings are reviewed with the patient and  his neighbor.  2. Recommend biopsy of his left sinus mass for definitive diagnosis and treatment.  3. The risks, benefits, alternatives, and details of the procedure are reviewed with the patient. Questions are invited and answered. 4. The procedure will be scheduled as soon as possible.

## 2020-01-25 ENCOUNTER — Encounter (HOSPITAL_COMMUNITY): Payer: Self-pay | Admitting: Otolaryngology

## 2020-01-29 ENCOUNTER — Other Ambulatory Visit: Payer: Self-pay

## 2020-01-29 ENCOUNTER — Telehealth: Payer: Self-pay | Admitting: Family Medicine

## 2020-01-29 MED ORDER — AMLODIPINE BESYLATE 10 MG PO TABS: 10.0000 mg | ORAL_TABLET | Freq: Every day | ORAL | 1 refills | Status: DC

## 2020-01-29 NOTE — Telephone Encounter (Signed)
Patient advised of recommendations. Most recent BP was 166/93. Rx sent in for amlodipine 10mg  (30,1). Will call back on Friday with BP/HR readings.

## 2020-01-29 NOTE — Telephone Encounter (Signed)
Patient's wife called after hours service 01/28/20 9:56pm: Stated his BP was 196/122 and he is on atenolol once daily. She states at 9:05pm, she gave him another dose of atenolol, per the nurses station at assisted living and now his BP was 198/111. Caller denied any other symptoms and patient refused to go to facility. They were advised to see HCP within 4 hours.

## 2020-01-29 NOTE — Telephone Encounter (Signed)
Please call patient and see if BP has improved. If still greater than 148 systolic or 403 diastolic please ask if he has any headache or focal weakness or slurred speech. If no symptoms then call in amlodipine 10 mg, one tab daily.  Continue taking Atenolol 100 mg daily.Monitor blood pressure and heart rate 1 to 2 times a day and call if remaining greater than 160/110 on Friday. If any symptoms such as headache focal weakness or slurred  speech or change in mental status then he needs to call 911 or go to the ER.

## 2020-01-29 NOTE — Telephone Encounter (Signed)
Spoke with patient regarding BP and recommended ED or urgent care for further evaluation. Declined having any other symptoms currently and also declined going to ED, urgent care or calling EMS.  Patient Name: Brendan Gonzalez Gender: Male DOB: Jun 02, 1919 Age: 86 Y 26 M 28 D Return Phone Number: [phone] (Primary) Address: City/State/Zip: [place] [address] Client [facility] - Client Client Site [facility] [facility] Physician Crissie Sickles - MD Contact Type Call Who Is Calling Patient / Member / Family / Caregiver Call Type Triage / Clinical Caller Name Marsh Heckler Relationship To Patient Spouse Return Phone Number [phone] (Primary) Chief Complaint Blood Pressure High Reason for Call Symptomatic / Request for [name] states that husband had a stroke previously, BP 196/122, takes a heart medicine. Nursing station said to take heart medicine and check back in an hour. if still high wants to know if should go to ED. Additional Comment She is hard of hearing. Translation No Nurse Assessment Nurse: Rayburn Felt, RN, Col[place]ta [place] Date/Time (Eastern Time): 01/28/2020 9:46:08 PM Confirm and document reason for call. If symptomatic, describe symptoms. ---Caller stated that her husbands BP was 196/122. Caller stated that he is on atenolol. Caller stated that he takes it once a day in the am. Caller stated they called the nurses station and the nurse at the assisted living told her to have him take another dose of the atenolol and he took it at 9:05 pm. Blood pressure now 198/111. Caller denies any other symptoms. Does the patient have any new or worsening symptoms? ---Yes Will a triage be completed? ---Yes Related visit to physician within the last 2 weeks? ---No Does the PT have any chronic conditions? (i.e. diabetes, asthma, this includes High risk factors for pregnancy, etc.) ---Yes List chronic conditions.  ---Hx Stroke, HTN Is this a behavioral health or substance abuse call? ---No Guidelines Guideline Title Affirmed Question Affirmed Notes Nurse Date/Time (Eastern Time) Blood Pressure - High [7] Systolic BP >= 001 OR Diastolic >= 749 AND [4] having NO cardiac or neurologic symptoms Hembree, RN, Colletta [place] 01/28/2020 9:51:36 PM PLEASE NOTE: All timestamps contained within this report are represented as [place] Standard Time. CONFIDENTIALTY NOTICE: This fax transmission is intended only for the addressee. It contains information that is legally privileged, confidential or otherwise protected from use or disclosure. If you are not the intended recipient, you are strictly prohibited from reviewing, disclosing, copying using or disseminating any of this information or taking any action in reliance on or regarding this information. If you have received this fax in error, please notify us immediately by telephone so that we can arrange for its return to [place]. Phone: [phone], Toll-Free: [phone], Fax: [phone] Page: 2 of 2 Call Id: [id number] [place]. Time Eilene Ghazi Time) Disposition Final User 01/28/2020 9:56:34 PM See HCP within 4 Hours (or PCP triage) Yes Hembree, RN, Ellouise Newer Disagree/Comply Disagree Caller Understands Yes PreDisposition Did not know what to do Care Advice Given Per Guideline SEE HCP (OR PCP TRIAGE) WITHIN 4 HOURS: CALL BACK IF: * Weakness or numbness of the face, arm or leg on one side of the body occurs * Difficulty walking, difficulty talking, or severe headache occurs * Chest pain or difficulty breathing occurs * You become worse CARE ADVICE given per High Blood Pressure (Adult) guideline. Comments User: Elmer Picker, RN Date/Time Eilene Ghazi Time): 01/28/2020 9:57:24 PM Patient stated that he is not going to the ED tonight. He stated he will wait and call  the office in the am. Referrals GO TO FACILITY REFUSED

## 2020-01-30 DIAGNOSIS — D385 Neoplasm of uncertain behavior of other respiratory organs: Secondary | ICD-10-CM | POA: Diagnosis not present

## 2020-01-30 DIAGNOSIS — J322 Chronic ethmoidal sinusitis: Secondary | ICD-10-CM | POA: Diagnosis not present

## 2020-01-30 LAB — SURGICAL PATHOLOGY

## 2020-01-31 ENCOUNTER — Encounter: Payer: Self-pay | Admitting: Family Medicine

## 2020-02-10 NOTE — Progress Notes (Signed)
Referring-Philip McGowen, MD Reason for referral-hypertension  HPI: 86 year old male for evaluation of hypertension at request of Shawnie Dapper, MD.  Echocardiogram December 2017 showed normal LV function, grade 1 diastolic dysfunction, trace aortic insufficiency and aortic root dilated at 44 mm.  Abdominal CT November 2019 showed left main and three-vessel coronary atherosclerosis.  CTA October 2021 showed mass in the posterior left ethmoid air cells and sphenoid sinus extending to the left orbital apex and impacting the left optic nerve.  This was felt to likely represent a malignancy.  Patient had biopsy but tissue insufficient for diagnostic purposes.  Patient apparently has been on atenolol and amlodipine previously for high blood pressure.  However he was not taking his amlodipine by mistake.  When he had his sinus biopsied his blood pressure was significantly elevated prior to the procedure.  He was recently seen by primary care and clonidine patch was initiated.  He has been on this for 3 days.  He resumed his amlodipine 2 days ago.  He denies dyspnea, chest pain, palpitations or syncope.  Current Outpatient Medications  Medication Sig Dispense Refill  . amLODipine (NORVASC) 10 MG tablet Take 1 tablet (10 mg total) by mouth daily. 30 tablet 1  . atenolol (TENORMIN) 100 MG tablet TAKE ONE TABLET BY MOUTH EVERY DAY 90 tablet 0  . cholecalciferol (VITAMIN D) 1000 UNITS tablet Take 1,000 Units by mouth daily.    . cloNIDine (CATAPRES-TTS-1) 0.1 mg/24hr patch Place 1 patch (0.1 mg total) onto the skin once a week. 4 patch 6  . escitalopram (LEXAPRO) 10 MG tablet TAKE ONE TABLET BY MOUTH EVERY DAY 30 tablet 1  . vitamin C (ASCORBIC ACID) 500 MG tablet Take 500 mg by mouth daily.    . vitamin E 400 UNIT capsule Take 400 Units by mouth daily.      No current facility-administered medications for this visit.    No Known Allergies   Past Medical History:  Diagnosis Date  . C2 cervical  fracture ([place]) 07/2017   Type II morphology with posterior displacement--C collar (potentially indefinitely).  . Chronic lower back pain   . Chronic renal insufficiency, stage III (moderate) (HCC)    GFR 50s-60s  . Debilitated patient   . 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  RT 12/2017 helped with his pain; pt is to get chemo 01/2018.  PET 03/06/18: good response to tx.  No further treatment or imaging done.  . Generalized weakness   . Goals of care, counseling/discussion 12/15/2017  . History of colon cancer 1993  . History of CVA (cerebrovascular accident)    Left MCA territory.  Carotid dopplers ok, Brain MRA ok, ECHO with grd I DD.  As of 02/2018->no ASA or plavix or statin due to pt getting chemo and RT + the related alteration in goals of care.  . Hypertension   . Lumbar spinal stenosis    Surgery 2012 helped but in 2015 pain has returned  . Lytic bone lesions on xray 08/2017   Skull, mildly abnormal bone scan. SPEP/UPEP w/out monoclonal spike.  Dr. Marin Olp eval 08/2017. Lytic lesions worsened-> Sacrum bx->large B cell lymphoma->XRT +XGEVA and Rituxan/bendamustine (completed 3 of 6 planned cycles--last one 03/2018).  . 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 Benjamine Mola) did bx 01/24/20 and found "Cystic left ethmoid mass. A large amount of mucoid fluid was drained"  path was nondiagnostic  . Osteoarthritis   . Self-care deficit   . Sudden loss of vision, left 12/2019   Dr. Posey Pronto at [facility] eye-->exam ok-->retinal specialist->ok-->MR brain/maxillofacial->L ethmoid/sphenoid sinus mass invading orbits, compressing L>R optic nerve  . Unstable gait   . Venous insufficiency of both lower extremities    made worse by amlodipine  . Vision loss of left eye 12/2019   MRI showed L sinus mass with mass effect on L>R optic nerves    Past Surgical History:  Procedure  Laterality Date  . BONE BIOPSY  12/2017   sacrum->large B cell lymphoma ([facility])  . CHOLECYSTECTOMY  1984  . ETHMOIDECTOMY Left 01/24/2020   Procedure: ETHMOIDECTOMY AND BIOPSY OF MASS;  Surgeon: Leta Baptist, MD;  Location: [place];  Service: ENT;  Laterality: Left;  . INGUINAL HERNIA REPAIR  1974   [place]  . LUMBAR SPINE SURGERY  2012   for spinal stenosis (Dr. Jonelle Sports in [place])  . LUNG LOBECTOMY  1993   LLL per pt--worry of possible cancer but turned out to be benign.  [name] PARTIAL COLECTOMY  1993   for colon cancer  . TRANSTHORACIC ECHOCARDIOGRAM  03/15/2016   Normal EF, grd I DD, mod dil aortic root    Social History   Socioeconomic History  . Marital status: Married    Spouse name: Not on file  . Number of children: Not on file  . Years of education: Not on file  . Highest education level: Not on file  Occupational History  . Not on file  Tobacco Use  . Smoking status: Former Research scientist (life sciences)  . Smokeless tobacco: Never Used  Vaping Use  . Vaping Use: Never used  Substance and Sexual Activity  . Alcohol use: Yes    Comment: daily, gin every night  . Drug use: No  . Sexual activity: Not on file  Other Topics Concern  . Not on file  Social History Narrative   Married, 1 son and 1 daughter.   Education: BS in Pharmacologist.   Occupation: Pharmacologist (retired).   As of 01/2014, living at [facility] with his wife.   No T/A/Ds.     Social Determinants of Health   Financial Resource Strain:   . Difficulty of Paying Living Expenses: Not on file  Food Insecurity:   . Worried About Charity fundraiser in the Last Year: Not on file  . Ran Out of Food in the Last Year: Not on file  Transportation Needs:   . Lack of Transportation (Medical): Not on file  . Lack of Transportation (Non-Medical): Not on file  Physical Activity:   . Days of Exercise per Week: Not on file  . Minutes of Exercise per Session: Not on file  Stress:   . Feeling of Stress : Not on file  Social Connections:   .  Frequency of Communication with Friends and Family: Not on file  . Frequency of Social Gatherings with Friends and Family: Not on file  . Attends Religious Services: Not on file  . Active Member of Clubs or Organizations: Not on file  . Attends Archivist Meetings: Not on file  . Marital Status: Not on file  Intimate Partner Violence:   . Fear of Current or Ex-Partner: Not on file  . Emotionally Abused: Not on file  . Physically Abused: Not on file  . Sexually Abused: Not on file    Family History  Problem Relation Age of Onset  . Parkinson's disease Mother   .  Heart attack Mother   . Cancer Brother   . Cancer Daughter     ROS: Fatigue but no fevers or chills, productive cough, hemoptysis, dysphasia, odynophagia, melena, hematochezia, dysuria, hematuria, rash, seizure activity, orthopnea, PND, pedal edema, claudication. Remaining systems are negative.  Physical Exam:   Blood pressure 115/71, pulse 65, temperature (!) 95.7 F (35.4 C), height 5\' 5"  (1.651 m), weight 154 lb 3.2 oz (69.9 kg), SpO2 95 %.  General:  Well developed/frail in NAD Skin warm/dry Patient not depressed No peripheral clubbing Back-normal HEENT-normal/normal eyelids Neck supple/normal carotid upstroke bilaterally; no bruits; no JVD; no thyromegaly chest - CTA/ normal expansion CV - RRR/normal S1 and S2; no murmurs, rubs or gallops;  PMI nondisplaced Abdomen -NT/ND, no HSM, no mass, + bowel sounds, no bruit 2+ femoral pulses, no bruits Ext-no edema, chords, 2+ DP Neuro-grossly nonfocal  A/P  1 hypertension-blood pressure is borderline low today for his age.  We will continue amlodipine and clonidine and I will decrease atenolol to 50 mg daily.  We will follow blood pressure at home and adjust regimen as needed.  Conservative measures given patient's age.  Kirk Ruths, MD

## 2020-02-14 ENCOUNTER — Other Ambulatory Visit: Payer: Self-pay

## 2020-02-14 ENCOUNTER — Encounter: Payer: Self-pay | Admitting: Family Medicine

## 2020-02-14 ENCOUNTER — Ambulatory Visit (INDEPENDENT_AMBULATORY_CARE_PROVIDER_SITE_OTHER): Payer: Medicare Other | Admitting: Family Medicine

## 2020-02-14 VITALS — BP 185/119 | HR 71 | Temp 98.2°F | Resp 16 | Ht 65.0 in | Wt 157.8 lb

## 2020-02-14 DIAGNOSIS — I10 Essential (primary) hypertension: Secondary | ICD-10-CM

## 2020-02-14 DIAGNOSIS — Z9114 Patient's other noncompliance with medication regimen: Secondary | ICD-10-CM | POA: Diagnosis not present

## 2020-02-14 DIAGNOSIS — J3489 Other specified disorders of nose and nasal sinuses: Secondary | ICD-10-CM | POA: Diagnosis not present

## 2020-02-14 MED ORDER — CLONIDINE 0.1 MG/24HR TD PTWK: 0.1000 mg | MEDICATED_PATCH | TRANSDERMAL | 6 refills | Status: AC

## 2020-02-14 NOTE — Progress Notes (Signed)
OFFICE VISIT  02/14/2020  CC:  Chief Complaint  Patient presents with  . Follow-up    RCI    HPI:    Patient is a 86 y.o. Caucasian male who presents accompanied by his wife and son for 3 mo f/u HTN, CRI III, and chronic debilitation. A/P as of last visit: "1) Impaired functional mobility, balance, gait, and endurance. I definitely feel that he needs motorized scooter and that it is necessary for safe mobility in his environment and give ability to participate in the reasonable amount of activity at his living facility (such as going to the dining room to eat with others).  2) HTN: The current medical regimen is effective;  continue present plan and medications.  3) CRI III: needs to hydrate better. He avoids NSAIDs. Lytes/cr today."  INTERIM HX: Since I last saw him he has had vision loss in L eye, eval by ophthalm suggested possible extraophth etiology so MRI orbits 01/17/20 obtained and it showed L sinus mass with mass effect on L>R optic nerves.  He then got bx of the mass by Dr. Benjamine Mola on 01/24/20.  Cystic L ethmoid mass, large amount of mucoid fluid drained, path nondiagnostic.  Says he feels pretty good. He says he takes his meds daily but son says he thinks this is likely not accurate based on his observations.   At ENT office son says his bp was >200 over >100. Son made him appt with Dr. Stanford Breed which is in 3d. No dizziness or pain. C/o very poor L eye vision.  Right eye vision stable.  ROS: no fevers, no CP, no SOB, no wheezing, no cough, no dizziness, no HAs, no rashes, no melena/hematochezia.  No polyuria or polydipsia.  No myalgias or arthralgias.  No focal weakness, paresthesias, or tremors.  No n/v/d or abd pain.  No palpitations.     Past Medical History:  Diagnosis Date  . C2 cervical fracture ([place]) 07/2017   Type II morphology with posterior displacement--C collar (potentially indefinitely).  . Chronic lower back pain   . Chronic renal insufficiency, stage  III (moderate) (HCC)    GFR 50s-60s  . Debilitated patient   . 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  RT 12/2017 helped with his pain; pt is to get chemo 01/2018.  PET 03/06/18: good response to tx.  No further treatment or imaging done.  . Generalized weakness   . Goals of care, counseling/discussion 12/15/2017  . History of colon cancer 1993  . History of CVA (cerebrovascular accident)    Left MCA territory.  Carotid dopplers ok, Brain MRA ok, ECHO with grd I DD.  As of 02/2018->no ASA or plavix or statin due to pt getting chemo and RT + the related alteration in goals of care.  . Hypertension   . Lumbar spinal stenosis    Surgery 2012 helped but in 2015 pain has returned  . Lytic bone lesions on xray 08/2017   Skull, mildly abnormal bone scan. SPEP/UPEP w/out monoclonal spike.  Dr. Marin Olp eval 08/2017. Lytic lesions worsened-> Sacrum bx->large B cell lymphoma->XRT +XGEVA and Rituxan/bendamustine (completed 3 of 6 planned cycles--last one 03/2018).  . 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 Benjamine Mola) did bx 01/24/20 and found "Cystic left ethmoid mass. A large amount of mucoid fluid was drained" path was nondiagnostic  . Osteoarthritis   . Self-care deficit   .  Sudden loss of vision, left 12/2019   Dr. Posey Pronto at [facility] eye-->exam ok-->retinal specialist->ok-->MR brain/maxillofacial->L ethmoid/sphenoid sinus mass invading orbits, compressing L>R optic nerve  . Unstable gait   . Venous insufficiency of both lower extremities    made worse by amlodipine  . Vision loss of left eye 12/2019   MRI showed L sinus mass with mass effect on L>R optic nerves    Past Surgical History:  Procedure Laterality Date  . BONE BIOPSY  12/2017   sacrum->large B cell lymphoma (NHL)  . CHOLECYSTECTOMY  1984  . ETHMOIDECTOMY Left 01/24/2020   Procedure: ETHMOIDECTOMY AND BIOPSY OF  MASS;  Surgeon: Leta Baptist, MD;  Location: [facility];  Service: ENT;  Laterality: Left;  . INGUINAL HERNIA REPAIR  1974   Bilat  . LUMBAR SPINE SURGERY  2012   for spinal stenosis (Dr. Jonelle Sports in [place])  . LUNG LOBECTOMY  1993   LLL per pt--worry of possible cancer but turned out to be benign.  [name] PARTIAL COLECTOMY  1993   for colon cancer  . TRANSTHORACIC ECHOCARDIOGRAM  03/15/2016   Normal EF, grd I DD, mod dil aortic root    Outpatient Medications Prior to Visit  Medication Sig Dispense Refill  . amLODipine (NORVASC) 10 MG tablet Take 1 tablet (10 mg total) by mouth daily. 30 tablet 1  . atenolol (TENORMIN) 100 MG tablet TAKE ONE TABLET BY MOUTH EVERY DAY 90 tablet 0  . cholecalciferol (VITAMIN D) 1000 UNITS tablet Take 1,000 Units by mouth daily.    [name] escitalopram (LEXAPRO) 10 MG tablet TAKE ONE TABLET BY MOUTH EVERY DAY 30 tablet 1  . vitamin C (ASCORBIC ACID) 500 MG tablet Take 500 mg by mouth daily.    . vitamin E 400 UNIT capsule Take 400 Units by mouth daily.      No facility-administered medications prior to visit.    No Known Allergies  ROS As per HPI  PE: Vitals with BMI 02/14/2020 01/24/2020 01/24/2020  Height 5\' 5"  - -  Weight 157 lbs 13 oz - -  BMI 99.24 - -  Systolic 268 341 962  Diastolic 229 81 81  Pulse 71 71 70  Initial bp today 185/119 with automated bp cuff. Repeat bp manual later in visit: 130/80   Gen: Alert, well appearing.  Patient is oriented to person, place, time, and situation. AFFECT: pleasant, lucid thought and speech. CV: RRR, no m/r/g.   LUNGS: CTA bilat, nonlabored resps, good aeration in all lung fields. EXT: no clubbing or cyanosis.  no edema.    LABS:    Chemistry      Component Value Date/Time   NA 133 (L) 01/21/2020 1032   K 4.3 01/21/2020 1032   CL 96 (L) 01/21/2020 1032   CO2 24 01/21/2020 1032   BUN 16 01/21/2020 1032   CREATININE 1.06 01/21/2020 1032      Component Value Date/Time   CALCIUM 9.4 01/21/2020 1032   ALKPHOS 48  03/26/2018 0745   AST 19 01/21/2020 1032   AST 18 03/26/2018 0745   ALT 12 01/21/2020 1032   ALT 12 03/26/2018 0745   BILITOT 0.6 01/21/2020 1032   BILITOT 0.5 03/26/2018 0745     Lab Results  Component Value Date   WBC 8.0 01/21/2020   HGB 14.5 01/21/2020   HCT 44.2 01/21/2020   MCV 89.5 01/21/2020   PLT 210 01/21/2020    IMPRESSION AND PLAN:  1) Uncontrolled HTN--not entirely clear hx.  BPs seem very  high at times but others normal. Not really confident about compliance with current bp meds. Start cautiously: add clonidine 0.1mg  patch q week. ? Needs 24H ambulatory bp monitoring ?[name] He has appt with his cardiologist in 3d.  2) Sinus mass: unknown etiology.  Seemed to be cystic, benign-appearing on recent bx by ENT 01/24/20. CT sinuses to be done at end of November per pt report of what ENT is going to do. Pt's L eye vision impairment from this is severe and unchanged.  Spent 31 min with pt today reviewing HPI, reviewing relevant past history, doing exam, reviewing and discussing lab and imaging data, and formulating plans.  An After Visit Summary was printed and given to the patient.  FOLLOW UP: Return in about 4 weeks (around 03/13/2020) for f/u sinus mass and HTN.  Signed:  Crissie Sickles, MD           02/14/2020

## 2020-02-17 ENCOUNTER — Encounter: Payer: Self-pay | Admitting: Cardiology

## 2020-02-17 ENCOUNTER — Ambulatory Visit (INDEPENDENT_AMBULATORY_CARE_PROVIDER_SITE_OTHER): Payer: Medicare Other | Admitting: Cardiology

## 2020-02-17 ENCOUNTER — Other Ambulatory Visit: Payer: Self-pay

## 2020-02-17 VITALS — BP 115/71 | HR 65 | Temp 95.7°F | Ht 65.0 in | Wt 154.2 lb

## 2020-02-17 DIAGNOSIS — I10 Essential (primary) hypertension: Secondary | ICD-10-CM

## 2020-02-17 MED ORDER — ATENOLOL 50 MG PO TABS: 50.0000 mg | ORAL_TABLET | Freq: Every day | ORAL | 3 refills | Status: AC

## 2020-02-17 MED ORDER — ATENOLOL 50 MG PO TABS: 50.0000 mg | ORAL_TABLET | Freq: Every day | ORAL | Status: DC

## 2020-02-17 NOTE — Patient Instructions (Signed)
Medication Instructions:   DECREASE ATENOLOL TO 50 MG ONCE DAILY= 1/2 OF THE 100 MG TABLET ONCE DAILY  *If you need a refill on your cardiac medications before your next appointment, please call your pharmacy*  Follow-Up: At [facility], you and your health needs are our priority.  As part of our continuing mission to provide you with exceptional heart care, we have created designated Provider Care Teams.  These Care Teams include your primary Cardiologist (physician) and Advanced Practice Providers (APPs -  Physician Assistants and Nurse Practitioners) who all work together to provide you with the care you need, when you need it.  We recommend signing up for the patient portal called "MyChart".  Sign up information is provided on this After Visit Summary.  MyChart is used to connect with patients for Virtual Visits (Telemedicine).  Patients are able to view lab/test results, encounter notes, upcoming appointments, etc.  Non-urgent messages can be sent to your provider as well.   To learn more about what you can do with MyChart, go to [email or web address].    Your next appointment:   3 month(s)  The format for your next appointment:   In Person  Provider:   Kirk Ruths, MD

## 2020-02-17 NOTE — Addendum Note (Signed)
Addended by: Cristopher Estimable on: 02/17/2020 03:01 PM   Modules accepted: Orders

## 2020-02-19 ENCOUNTER — Other Ambulatory Visit: Payer: Self-pay | Admitting: Otolaryngology

## 2020-03-02 ENCOUNTER — Other Ambulatory Visit: Payer: Self-pay | Admitting: Family Medicine

## 2020-03-09 ENCOUNTER — Other Ambulatory Visit: Payer: Self-pay | Admitting: Otolaryngology

## 2020-03-09 DIAGNOSIS — J3489 Other specified disorders of nose and nasal sinuses: Secondary | ICD-10-CM

## 2020-03-10 ENCOUNTER — Other Ambulatory Visit: Payer: Self-pay

## 2020-03-10 ENCOUNTER — Encounter: Payer: Self-pay | Admitting: Family Medicine

## 2020-03-10 ENCOUNTER — Ambulatory Visit (INDEPENDENT_AMBULATORY_CARE_PROVIDER_SITE_OTHER): Payer: Medicare Other | Admitting: Family Medicine

## 2020-03-10 VITALS — BP 94/60 | HR 69 | Temp 97.4°F | Resp 16 | Ht 65.0 in | Wt 158.4 lb

## 2020-03-10 DIAGNOSIS — I952 Hypotension due to drugs: Secondary | ICD-10-CM | POA: Diagnosis not present

## 2020-03-10 NOTE — Patient Instructions (Signed)
Stop atenolol, amlodipine, and clonidine patch now. Check blood pressure every morning and every evening. If bp climbs back up above 145 on top or 90 on bottom then restart atenolol 50 mg tab once a day. If bp remains >145 on top or >90 on bottom then restart amlodipine, ONE HALF of the 10mg  tab daily.

## 2020-03-10 NOTE — Progress Notes (Signed)
OFFICE VISIT  03/10/2020  CC:  Chief Complaint  Patient presents with  . Leg weakness  . Low BP    most recent BP taken by nurse this morning was 106/66.     HPI:    Patient is a 86 y.o. Caucasian male with HTN and CRI IIIa who presents accompanied by his wife and another caregiver for "leg weakness and low bp".  Legs weak when got up to go to the [employer] last night.  He couldn't get up off toilet when caregiver got there this morning.   BP this morning at home 106/66. He denies dizziness.  Has chronic mild LBP. His baseline is minimal activity, usually stays in bed most of the day.  Per nurse his BP one day last week 125/67. No other bp measurements known from lately.   Has been taking clonidine patch (the last 3 wks), atenolol, and amlodipine as rx'd---but no pills taken today.  Doesn't drink much water at all (about 12-14 oz/day).  1 cup coffee per day, some milk w/cereal qAM, 1 glass tea at lunch.  Glass of milk at supper. No probs with urination or BMs.  ROS: no fevers, no CP, no SOB, no wheezing, no cough, no dizziness, no HAs, no rashes, no melena/hematochezia.  No polyuria or polydipsia.  No myalgias or arthralgias.  No paresthesias or tremors.  No acute vision or hearing abnormalities. No n/v/d or abd pain.  No palpitations.     Past Medical History:  Diagnosis Date  . C2 cervical fracture ([place]) 07/2017   Type II morphology with posterior displacement--C collar (potentially indefinitely).  . Chronic lower back pain   . Chronic renal insufficiency, stage III (moderate) (HCC)    GFR 50s-60s  . Debilitated patient   . Diffuse large cell non-Hodgkin's lymphoma ([place]) 12/15/2017   B cell NHL --osseous involvement only.  RT 12/2017 helped with his pain; pt is to get chemo 01/2018.  PET 03/06/18: good response to tx.  No further treatment or imaging done.  . Generalized weakness   . Goals of care, counseling/discussion 12/15/2017  . History of colon cancer 1993  . History of  CVA (cerebrovascular accident)    Left MCA territory.  Carotid dopplers ok, Brain MRA ok, ECHO with grd I DD.  As of 02/2018->no ASA or plavix or statin due to pt getting chemo and RT + the related alteration in goals of care.  . Hypertension   . Lumbar spinal stenosis    Surgery 2012 helped but in 2015 pain has returned  . Lytic bone lesions on xray 08/2017   Skull, mildly abnormal bone scan. SPEP/UPEP w/out monoclonal spike.  Dr. Marin Olp eval 08/2017. Lytic lesions worsened-> Sacrum bx->large B cell lymphoma->XRT +XGEVA and Rituxan/bendamustine (completed 3 of 6 planned cycles--last one 03/2018).  . 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 Benjamine Mola) did bx 01/24/20 and found "Cystic left ethmoid mass. A large amount of mucoid fluid was drained" path was nondiagnostic  . Osteoarthritis   . Self-care deficit   . Sudden loss of vision, left 12/2019   Dr. Posey Pronto at [facility] eye-->exam ok-->retinal specialist->ok-->MR brain/maxillofacial->L ethmoid/sphenoid sinus mass invading orbits, compressing L>R optic nerve  . Unstable gait   . Venous insufficiency of both lower extremities    made worse by amlodipine  . Vision loss of left eye 12/2019   MRI showed L sinus mass with mass effect on L>R optic nerves  Past Surgical History:  Procedure Laterality Date  . BONE BIOPSY  12/2017   sacrum->large B cell lymphoma (NHL)  . CHOLECYSTECTOMY  1984  . ETHMOIDECTOMY Left 01/24/2020   Procedure: ETHMOIDECTOMY AND BIOPSY OF MASS;  Surgeon: Leta Baptist, MD;  Location: [place];  Service: ENT;  Laterality: Left;  . INGUINAL HERNIA REPAIR  1974   Bilat  . LUMBAR SPINE SURGERY  2012   for spinal stenosis (Dr. Jonelle Sports in [place])  . LUNG LOBECTOMY  1993   LLL per pt--worry of possible cancer but turned out to be benign.  [name] PARTIAL COLECTOMY  1993   for colon cancer  . TRANSTHORACIC ECHOCARDIOGRAM  03/15/2016   Normal EF, grd I DD, mod dil aortic root     Outpatient Medications Prior to Visit  Medication Sig Dispense Refill  . amLODipine (NORVASC) 10 MG tablet Take 1 tablet (10 mg total) by mouth daily. 30 tablet 0  . atenolol (TENORMIN) 50 MG tablet Take 1 tablet (50 mg total) by mouth daily. 90 tablet 3  . cholecalciferol (VITAMIN D) 1000 UNITS tablet Take 1,000 Units by mouth daily.    . cloNIDine (CATAPRES-TTS-1) 0.1 mg/24hr patch Place 1 patch (0.1 mg total) onto the skin once a week. 4 patch 6  . escitalopram (LEXAPRO) 10 MG tablet TAKE ONE TABLET BY MOUTH EVERY DAY 30 tablet 1  . vitamin C (ASCORBIC ACID) 500 MG tablet Take 500 mg by mouth daily.    . vitamin E 400 UNIT capsule Take 400 Units by mouth daily.      No facility-administered medications prior to visit.    No Known Allergies  ROS As per HPI  PE: Vitals with BMI 03/10/2020 02/17/2020 02/14/2020  Height 5\' 5"  5\' 5"  5\' 5"   Weight 158 lbs 6 oz 154 lbs 3 oz 157 lbs 13 oz  BMI 26.36 72.09 47.09  Systolic 94 628 366  Diastolic 60 71 294  Pulse 69 65 71   Gen: Alert, tired-appearing, NAD.  Patient is oriented to person, place, time, and situation. AFFECT: pleasant, lucid thought and speech. CV: RRR, no m/r/g.   LUNGS: CTA bilat, nonlabored resps, good aeration in all lung fields. EXT: no clubbing or cyanosis.  no edema.  No focal neurologic deficit.  LABS:    Chemistry      Component Value Date/Time   NA 133 (L) 01/21/2020 1032   K 4.3 01/21/2020 1032   CL 96 (L) 01/21/2020 1032   CO2 24 01/21/2020 1032   BUN 16 01/21/2020 1032   CREATININE 1.06 01/21/2020 1032      Component Value Date/Time   CALCIUM 9.4 01/21/2020 1032   ALKPHOS 48 03/26/2018 0745   AST 19 01/21/2020 1032   AST 18 03/26/2018 0745   ALT 12 01/21/2020 1032   ALT 12 03/26/2018 0745   BILITOT 0.6 01/21/2020 1032   BILITOT 0.5 03/26/2018 0745     Lab Results  Component Value Date   WBC 8.0 01/21/2020   HGB 14.5 01/21/2020   HCT 44.2 01/21/2020   MCV 89.5 01/21/2020   PLT 210  01/21/2020   IMPRESSION AND PLAN:  Generalized weakness.  Suspect this is due to hypotension secondary to bp meds + chronic poor fluid intake. Encouraged intake of fluids AT LEAST 65 oz/day. HOLD all bp meds for now, restart gradually as bp rises. Instructions: Stop atenolol, amlodipine, and clonidine patch now. Check blood pressure every morning and every evening. If bp climbs back up above 145 on top  or 90 on bottom then restart atenolol 50 mg tab once a day. If bp remains >145 on top or >90 on bottom then restart amlodipine, ONE HALF of the 10mg  tab daily.  An After Visit Summary was printed and given to the patient.  FOLLOW UP: Return in about 1 week (around 03/17/2020) for f/u bp.  Signed:  Crissie Sickles, MD           03/10/2020

## 2020-03-17 ENCOUNTER — Encounter: Payer: Self-pay | Admitting: Family Medicine

## 2020-03-17 ENCOUNTER — Ambulatory Visit (INDEPENDENT_AMBULATORY_CARE_PROVIDER_SITE_OTHER): Payer: Medicare Other | Admitting: Family Medicine

## 2020-03-17 ENCOUNTER — Other Ambulatory Visit: Payer: Self-pay

## 2020-03-17 VITALS — BP 147/95 | HR 95 | Temp 97.5°F | Resp 16 | Ht 65.0 in | Wt 154.6 lb

## 2020-03-17 DIAGNOSIS — H5440 Blindness, one eye, unspecified eye: Secondary | ICD-10-CM | POA: Diagnosis not present

## 2020-03-17 DIAGNOSIS — I952 Hypotension due to drugs: Secondary | ICD-10-CM | POA: Diagnosis not present

## 2020-03-17 DIAGNOSIS — J3489 Other specified disorders of nose and nasal sinuses: Secondary | ICD-10-CM

## 2020-03-17 DIAGNOSIS — I10 Essential (primary) hypertension: Secondary | ICD-10-CM

## 2020-03-17 NOTE — Patient Instructions (Signed)
Instructions: Stay off your blood pressure medications (atenolol, amlodipine, and clonidine patch). Check blood pressure every morning and every evening. If bp climbs back up above 145 on top or 90 on bottom then restart atenolol 50 mg tab once a day. If bp remains >145 on top or >90 on bottom then restart amlodipine, ONE HALF of the 10mg  tab daily."

## 2020-03-17 NOTE — Progress Notes (Signed)
OFFICE VISIT  03/17/2020  CC:  Chief Complaint  Patient presents with  . Follow-up    hypotension, pt is not fasting   HPI:    Patient is a 86 y.o. Caucasian male, debilitated patient with HTN and CRI II/III who presents for 1 week f/u hypotension due to meds + poor fluid intake. A/P as of last visit: "Generalized weakness.  Suspect this is due to hypotension secondary to bp meds + chronic poor fluid intake. Encouraged intake of fluids AT LEAST 65 oz/day. HOLD all bp meds for now, restart gradually as bp rises. Instructions: Stop atenolol, amlodipine, and clonidine patch now. Check blood pressure every morning and every evening. If bp climbs back up above 145 on top or 90 on bottom then restart atenolol 50 mg tab once a day. If bp remains >145 on top or >90 on bottom then restart amlodipine, ONE HALF of the 10mg  tab daily."  INTERIM HX: Feeling ok, no acute or new complaints. Home bps reviewed: 150s/90s for 2d last week after I saw him, no med restarted. BPs the last 4d have been <130/80, with some being as low as 100/60.  No hR data.  He denies palpitations or heart racing.  No CP, SOB, or dizziness.  No HAs.  Still can't see out of left eye.  Has some nasal/facial/sinus pain since having biopsy procedure for his sinus mass 6 weeks ago. Takes tylenol and sometimes ibup and it helps signif. Has repeat CT sinuses/orbits set for 03/30/20 per his ENT MD.  Past Medical History:  Diagnosis Date  . C2 cervical fracture ([place]) 07/2017   Type II morphology with posterior displacement--C collar (potentially indefinitely).  . Chronic lower back pain   . Chronic renal insufficiency, stage III (moderate) (HCC)    GFR 50s-60s  . Debilitated patient   . 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  RT 12/2017 helped with his pain; pt is to get chemo 01/2018.  PET 03/06/18: good response to tx.  No further treatment or imaging done.  .  Generalized weakness   . Goals of care, counseling/discussion 12/15/2017  . History of colon cancer 1993  . History of CVA (cerebrovascular accident)    Left MCA territory.  Carotid dopplers ok, Brain MRA ok, ECHO with grd I DD.  As of 02/2018->no ASA or plavix or statin due to pt getting chemo and RT + the related alteration in goals of care.  . Hypertension   . Lumbar spinal stenosis    Surgery 2012 helped but in 2015 pain has returned  . Lytic bone lesions on xray 08/2017   Skull, mildly abnormal bone scan. SPEP/UPEP w/out monoclonal spike.  Dr. Marin Olp eval 08/2017. Lytic lesions worsened-> Sacrum bx->large B cell lymphoma->XRT +XGEVA and Rituxan/bendamustine (completed 3 of 6 planned cycles--last one 03/2018).  . 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 Benjamine Mola) did bx 01/24/20 and found "Cystic left ethmoid mass. A large amount of mucoid fluid was drained" path was nondiagnostic  . Osteoarthritis   . Self-care deficit   . Sudden loss of vision, left 12/2019   Dr. Posey Pronto at [facility]-->exam ok-->retinal specialist->ok-->MR brain/maxillofacial->L ethmoid/sphenoid sinus mass invading orbits, compressing L>R optic nerve  . Unstable gait   . Venous insufficiency of both lower extremities    made worse by amlodipine  . Vision loss of left eye 12/2019   MRI showed  L sinus mass with mass effect on L>R optic nerves    Past Surgical History:  Procedure Laterality Date  . BONE BIOPSY  12/2017   sacrum->large B cell lymphoma (NHL)  . CHOLECYSTECTOMY  1984  . ETHMOIDECTOMY Left 01/24/2020   Procedure: ETHMOIDECTOMY AND BIOPSY OF MASS;  Surgeon: Leta Baptist, MD;  Location: [place];  Service: ENT;  Laterality: Left;  . INGUINAL HERNIA REPAIR  1974   Bilat  . LUMBAR SPINE SURGERY  2012   for spinal stenosis (Dr. Jonelle Sports in [place])  . LUNG LOBECTOMY  1993   LLL per pt--worry of possible cancer but turned out to be benign.  [name] PARTIAL  COLECTOMY  1993   for colon cancer  . TRANSTHORACIC ECHOCARDIOGRAM  03/15/2016   Normal EF, grd I DD, mod dil aortic root    Outpatient Medications Prior to Visit  Medication Sig Dispense Refill  . cholecalciferol (VITAMIN D) 1000 UNITS tablet Take 1,000 Units by mouth daily.    [name] escitalopram (LEXAPRO) 10 MG tablet TAKE ONE TABLET BY MOUTH EVERY DAY 30 tablet 1  . vitamin C (ASCORBIC ACID) 500 MG tablet Take 500 mg by mouth daily.    . vitamin E 400 UNIT capsule Take 400 Units by mouth daily.     [name] amLODipine (NORVASC) 10 MG tablet Take 1 tablet (10 mg total) by mouth daily. (Patient not taking: Reported on 03/17/2020) 30 tablet 0  . atenolol (TENORMIN) 50 MG tablet Take 1 tablet (50 mg total) by mouth daily. (Patient not taking: Reported on 03/17/2020) 90 tablet 3  . cloNIDine (CATAPRES-TTS-1) 0.1 mg/24hr patch Place 1 patch (0.1 mg total) onto the skin once a week. (Patient not taking: Reported on 03/17/2020) 4 patch 6   No facility-administered medications prior to visit.    No Known Allergies  ROS As per HPI  PE: Vitals with BMI 03/17/2020 03/10/2020 02/17/2020  Height 5\' 5"  5\' 5"  5\' 5"   Weight 154 lbs 10 oz 158 lbs 6 oz 154 lbs 3 oz  BMI 25.73 02.58 52.77  Systolic 824 94 235  Diastolic 95 60 71  Pulse 95 69 65     Gen: Alert, well appearing.  Patient is oriented to person, place, time, and situation. AFFECT: pleasant, lucid thought and speech. EOMI bilat but says he can't see my finger with left eye. CV: RRR, with frequent periods of ectopy.  No m/r. Chest is clear, no wheezing or rales. Normal symmetric air entry throughout both lung fields. No chest wall deformities or tenderness. EXT: no clubbing or cyanosis.  no edema.    LABS:    Chemistry      Component Value Date/Time   NA 133 (L) 01/21/2020 1032   K 4.3 01/21/2020 1032   CL 96 (L) 01/21/2020 1032   CO2 24 01/21/2020 1032   BUN 16 01/21/2020 1032   CREATININE 1.06 01/21/2020 1032      Component Value  Date/Time   CALCIUM 9.4 01/21/2020 1032   ALKPHOS 48 03/26/2018 0745   AST 19 01/21/2020 1032   AST 18 03/26/2018 0745   ALT 12 01/21/2020 1032   ALT 12 03/26/2018 0745   BILITOT 0.6 01/21/2020 1032   BILITOT 0.5 03/26/2018 0745       IMPRESSION AND PLAN:  1) Hypotension due to bp meds + poor hydration habits. BP's fluctuating from low normal to mild HTN since off meds for the last week. Plan: Stay off your blood pressure medications (atenolol, amlodipine, and clonidine patch).  Check blood pressure every morning and every evening. If bp climbs back up above 145 on top or 90 on bottom then restart atenolol 50 mg tab once a day. If bp remains >145 on top or >90 on bottom then restart amlodipine, ONE HALF of the 10mg  tab daily."  2) Sinus mass: ? Cystic contents drained when pt got needle bx 6 wks ago.  Bx sample not very good but no sign of malignancy. Still with L eye blindness. Plan is for repeat CT sinuses/orbits as already arranged by his ENT-->set for 03/30/20.  An After Visit Summary was printed and given to the patient.  FOLLOW UP: Return in about 2 weeks (around 03/31/2020) for f/u bp's.  Signed:  Crissie Sickles, MD           03/17/2020

## 2020-03-30 ENCOUNTER — Ambulatory Visit
Admission: RE | Admit: 2020-03-30 | Discharge: 2020-03-30 | Disposition: A | Discharge status: Home / Self Care | Payer: Medicare Other | Source: Ambulatory Visit | Attending: Otolaryngology | Admitting: Otolaryngology

## 2020-03-30 DIAGNOSIS — J3489 Other specified disorders of nose and nasal sinuses: Secondary | ICD-10-CM

## 2020-04-01 ENCOUNTER — Other Ambulatory Visit: Payer: Self-pay

## 2020-04-01 ENCOUNTER — Ambulatory Visit: Payer: Medicare Other | Admitting: Family Medicine

## 2020-04-01 ENCOUNTER — Encounter: Payer: Self-pay | Admitting: Family Medicine

## 2020-04-01 VITALS — BP 153/100 | HR 85 | Temp 97.8°F | Resp 16 | Ht 65.0 in | Wt 151.0 lb

## 2020-04-01 DIAGNOSIS — I10 Essential (primary) hypertension: Secondary | ICD-10-CM

## 2020-04-01 DIAGNOSIS — I998 Other disorder of circulatory system: Secondary | ICD-10-CM

## 2020-04-01 NOTE — Progress Notes (Signed)
OFFICE VISIT  04/01/2020  CC:  Chief Complaint  Patient presents with  . Follow-up    Blood pressure    HPI:    Patient is a 86 y.o. Caucasian male who presents accompanied by a caregiver and his son for 2 wk f/u blood pressure fluctuations. A/P as of last visit: "1) Hypotension due to bp meds + poor hydration habits. BP's fluctuating from low normal to mild HTN since off meds for the last week. Plan: Stay off your blood pressure medications (atenolol, amlodipine, and clonidine patch). Check blood pressure every morning and every evening. If bp climbs back up above 145 on top or 90 on bottom then restart atenolol 50 mg tab once a day. If bp remains >145 on top or >90 on bottom then restart amlodipine, ONE HALF of the 10mg  tab daily."  2) Sinus mass: ? Cystic contents drained when pt got needle bx 6 wks ago.  Bx sample not very good but no sign of malignancy. Still with L eye blindness. Plan is for repeat CT sinuses/orbits as already arranged by his ENT-->set for 03/30/20."  INTERIM HX: BP has been normal sometimes---usually when at rest.  Up to 150/95 at times, usually after ambulating.  BP 117/73 this morning at home.  No meds have been given for bp today.  Amlodipine 1/2 of 10mg  tab is being given at times. Other times he also gets atenolol 50mg  tab. Never both on the same day. Some days not getting any bp meds at all. No hypotension. No dizziness, presyncope, signif acute weakness, CP, palpitations, or SOB.  Appetite is good at lunch, drinks a boost for dinner.  Doesn't drink fluids well, never has. His only complaint is that his face hurts ever since having the needle biopsy/aspiration procedure on his sinus mass by Dr. Benjamine Mola 01/24/20. He takes no med for pain. He has f/u with Dr. Benjamine Mola set for tomorrow to go over recent repeat of his CT sinuses done yesterday.   Past Medical History:  Diagnosis Date  . C2 cervical fracture ([place]) 07/2017   Type II morphology with  posterior displacement--C collar (potentially indefinitely).  . Chronic lower back pain   . Chronic renal insufficiency, stage III (moderate) (HCC)    GFR 50s-60s  . Debilitated patient   . 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  RT 12/2017 helped with his pain; pt is to get chemo 01/2018.  PET 03/06/18: good response to tx.  No further treatment or imaging done.  . Generalized weakness   . Goals of care, counseling/discussion 12/15/2017  . History of colon cancer 1993  . History of CVA (cerebrovascular accident)    Left MCA territory.  Carotid dopplers ok, Brain MRA ok, ECHO with grd I DD.  As of 02/2018->no ASA or plavix or statin due to pt getting chemo and RT + the related alteration in goals of care.  . Hypertension   . Lumbar spinal stenosis    Surgery 2012 helped but in 2015 pain has returned  . Lytic bone lesions on xray 08/2017   Skull, mildly abnormal bone scan. SPEP/UPEP w/out monoclonal spike.  Dr. Marin Olp eval 08/2017. Lytic lesions worsened-> Sacrum bx->large B cell lymphoma->XRT +XGEVA and Rituxan/bendamustine (completed 3 of 6 planned cycles--last one 03/2018).  . 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 Benjamine Mola) did bx 01/24/20 and found "Cystic left ethmoid mass. A  large amount of mucoid fluid was drained" path was nondiagnostic  . Osteoarthritis   . Self-care deficit   . Sudden loss of vision, left 12/2019   Dr. Posey Pronto at [facility] eye-->exam ok-->retinal specialist->ok-->MR brain/maxillofacial->L ethmoid/sphenoid sinus mass invading orbits, compressing L>R optic nerve  . Unstable gait   . Venous insufficiency of both lower extremities    made worse by amlodipine  . Vision loss of left eye 12/2019   MRI showed L sinus mass with mass effect on L>R optic nerves    Past Surgical History:  Procedure Laterality Date  . BONE BIOPSY  12/2017    sacrum->large B cell lymphoma (NHL)  . CHOLECYSTECTOMY  1984  . ETHMOIDECTOMY Left 01/24/2020   Procedure: ETHMOIDECTOMY AND BIOPSY OF MASS;  Surgeon: Leta Baptist, MD;  Location: [facility];  Service: ENT;  Laterality: Left;  . INGUINAL HERNIA REPAIR  1974   Bilat  . LUMBAR SPINE SURGERY  2012   for spinal stenosis (Dr. Jonelle [place])  . LUNG LOBECTOMY  1993   LLL per pt--worry of possible cancer but turned out to be benign.  [name] PARTIAL COLECTOMY  1993   for colon cancer  . TRANSTHORACIC ECHOCARDIOGRAM  03/15/2016   Normal EF, grd I DD, mod dil aortic root    Outpatient Medications Prior to Visit  Medication Sig Dispense Refill  . cholecalciferol (VITAMIN D) 1000 UNITS tablet Take 1,000 Units by mouth daily.    [name] escitalopram (LEXAPRO) 10 MG tablet TAKE ONE TABLET BY MOUTH EVERY DAY 30 tablet 1  . vitamin C (ASCORBIC ACID) 500 MG tablet Take 500 mg by mouth daily.    . vitamin E 400 UNIT capsule Take 400 Units by mouth daily.     [name] amLODipine (NORVASC) 10 MG tablet Take 1 tablet (10 mg total) by mouth daily. (Patient not taking: No sig reported) 30 tablet 0  . atenolol (TENORMIN) 50 MG tablet Take 1 tablet (50 mg total) by mouth daily. (Patient not taking: No sig reported) 90 tablet 3  . cloNIDine (CATAPRES-TTS-1) 0.1 mg/24hr patch Place 1 patch (0.1 mg total) onto the skin once a week. (Patient not taking: No sig reported) 4 patch 6   No facility-administered medications prior to visit.    No Known Allergies  ROS As per HPI  PE: Vitals with BMI 04/01/2020 03/17/2020 03/10/2020  Height 5\' 5"  5\' 5"  5\' 5"   Weight 151 lbs 154 lbs 10 oz 158 lbs 6 oz  BMI 25.13 79.02 40.97  Systolic 353 299 94  Diastolic 242 95 60  Pulse 85 95 69  Manual bp repeat today: 99/70  Gen: Alert, well appearing.  Patient is oriented to person, place, time, and situation. AFFECT: pleasant, lucid thought and speech. Face: no swelling or erythema.  EOMI. CV: RRR with occ ectopy/pause, distant S1 and S2 as per  his usual. no m/r/g.   LUNGS: CTA bilat, nonlabored resps, good aeration in all lung fields. EXT: no clubbing or cyanosis.  no edema.    LABS/imaging:    Chemistry      Component Value Date/Time   NA 133 (L) 01/21/2020 1032   K 4.3 01/21/2020 1032   CL 96 (L) 01/21/2020 1032   CO2 24 01/21/2020 1032   BUN 16 01/21/2020 1032   CREATININE 1.06 01/21/2020 1032      Component Value Date/Time   CALCIUM 9.4 01/21/2020 1032   ALKPHOS 48 03/26/2018 0745   AST 19 01/21/2020 1032   AST 18 03/26/2018 0745  ALT 12 01/21/2020 1032   ALT 12 03/26/2018 0745   BILITOT 0.6 01/21/2020 1032   BILITOT 0.5 03/26/2018 0745     IMPRESSION AND PLAN:  1) HTN: control a bit erratic but nothing persistently high or low.  His initial bp here today was a little high after walking into clinic and to exam room.  Then bp on manual recheck at end of visit today was 99/70-- but he is w/out any complaint. Continue current approach to treatment: Stay off your blood pressure medications (atenolol, amlodipine, and clonidine patch). Check blood pressure every morning and every evening. If bp climbs back up above 145 on top or 90 on bottom then restart atenolol 50 mg tab once a day. If bp remains >145 on top or >90 on bottom then restart amlodipine, ONE HALF of the 10mg  tab daily." Needs to increase fluids intake.  2) Sinus mass: compressing L optic nerve and causing L eye blindness. Nondiagnostic needle bx 01/24/20 by ENT MD. Repeat imaging was done yesterday per ENT, results pending, pt to f/u with his ENT MD tomorrow.  Pt did state today that he doesn't want any more procedures b/c his face still hurts from the bx 01/24/20, sounds like he prefers tx of his pain as the plan of action but I encouraged him to talk it out with his ENT MD.  An After Visit Summary was printed and given to the patient.  FOLLOW UP: Return in about 2 months (around 06/02/2020) for routine chronic illness f/u.  Signed:  Crissie Sickles,  MD           04/01/2020

## 2020-04-02 DIAGNOSIS — J338 Other polyp of sinus: Secondary | ICD-10-CM | POA: Diagnosis not present

## 2020-04-02 DIAGNOSIS — J322 Chronic ethmoidal sinusitis: Secondary | ICD-10-CM | POA: Diagnosis not present

## 2020-04-08 DIAGNOSIS — Z8572 Personal history of non-Hodgkin lymphomas: Secondary | ICD-10-CM | POA: Diagnosis not present

## 2020-04-08 DIAGNOSIS — C311 Malignant neoplasm of ethmoidal sinus: Secondary | ICD-10-CM | POA: Diagnosis not present

## 2020-04-16 ENCOUNTER — Ambulatory Visit: Payer: Medicare Other | Admitting: Hematology & Oncology

## 2020-04-16 ENCOUNTER — Other Ambulatory Visit: Payer: Medicare Other

## 2020-04-24 ENCOUNTER — Encounter: Payer: Self-pay | Admitting: Hematology & Oncology

## 2020-04-24 ENCOUNTER — Inpatient Hospital Stay (HOSPITAL_BASED_OUTPATIENT_CLINIC_OR_DEPARTMENT_OTHER): Payer: Medicare Other | Admitting: Hematology & Oncology

## 2020-04-24 ENCOUNTER — Other Ambulatory Visit: Payer: Self-pay

## 2020-04-24 ENCOUNTER — Inpatient Hospital Stay: Payer: Medicare Other | Attending: Hematology & Oncology

## 2020-04-24 VITALS — BP 123/75 | HR 53 | Temp 98.0°F | Resp 16 | Wt 144.0 lb

## 2020-04-24 DIAGNOSIS — I10 Essential (primary) hypertension: Secondary | ICD-10-CM | POA: Insufficient documentation

## 2020-04-24 DIAGNOSIS — C8350 Lymphoblastic (diffuse) lymphoma, unspecified site: Secondary | ICD-10-CM | POA: Diagnosis not present

## 2020-04-24 DIAGNOSIS — Z8673 Personal history of transient ischemic attack (TIA), and cerebral infarction without residual deficits: Secondary | ICD-10-CM | POA: Diagnosis not present

## 2020-04-24 DIAGNOSIS — Z8249 Family history of ischemic heart disease and other diseases of the circulatory system: Secondary | ICD-10-CM | POA: Insufficient documentation

## 2020-04-24 DIAGNOSIS — C8330 Diffuse large B-cell lymphoma, unspecified site: Secondary | ICD-10-CM

## 2020-04-24 DIAGNOSIS — Z79899 Other long term (current) drug therapy: Secondary | ICD-10-CM | POA: Insufficient documentation

## 2020-04-24 DIAGNOSIS — N183 Chronic kidney disease, stage 3 unspecified: Secondary | ICD-10-CM | POA: Diagnosis not present

## 2020-04-24 DIAGNOSIS — Z85038 Personal history of other malignant neoplasm of large intestine: Secondary | ICD-10-CM | POA: Insufficient documentation

## 2020-04-24 DIAGNOSIS — Z87891 Personal history of nicotine dependence: Secondary | ICD-10-CM | POA: Insufficient documentation

## 2020-04-24 LAB — CMP (CANCER CENTER ONLY)
ALT: 104 U/L — ABNORMAL HIGH (ref 0–44)
AST: 68 U/L — ABNORMAL HIGH (ref 15–41)
Albumin: 4 g/dL (ref 3.5–5.0)
Alkaline Phosphatase: 251 U/L — ABNORMAL HIGH (ref 38–126)
Anion gap: 9 (ref 5–15)
BUN: 21 mg/dL (ref 8–23)
CO2: 28 mmol/L (ref 22–32)
Calcium: 10.3 mg/dL (ref 8.9–10.3)
Chloride: 99 mmol/L (ref 98–111)
Creatinine: 1.13 mg/dL (ref 0.61–1.24)
GFR, Estimated: 59 mL/min — ABNORMAL LOW (ref >60)
Glucose, Bld: 91 mg/dL (ref 70–99)
Potassium: 4.3 mmol/L (ref 3.5–5.1)
Sodium: 136 mmol/L (ref 135–145)
Total Bilirubin: 1.2 mg/dL (ref 0.3–1.2)
Total Protein: 6.3 g/dL — ABNORMAL LOW (ref 6.5–8.1)

## 2020-04-24 LAB — CBC WITH DIFFERENTIAL (CANCER CENTER ONLY)
Abs Immature Granulocytes: 0.35 10*3/uL — ABNORMAL HIGH (ref 0.00–0.07)
Basophils Absolute: 0.1 10*3/uL (ref 0.0–0.1)
Basophils Relative: 1 %
Eosinophils Absolute: 0.2 10*3/uL (ref 0.0–0.5)
Eosinophils Relative: 2 %
HCT: 44.2 % (ref 39.0–52.0)
Hemoglobin: 14.6 g/dL (ref 13.0–17.0)
Immature Granulocytes: 3 %
Lymphocytes Relative: 12 %
Lymphs Abs: 1.3 10*3/uL (ref 0.7–4.0)
MCH: 29.7 pg (ref 26.0–34.0)
MCHC: 33 g/dL (ref 30.0–36.0)
MCV: 89.8 fL (ref 80.0–100.0)
Monocytes Absolute: 1.1 10*3/uL — ABNORMAL HIGH (ref 0.1–1.0)
Monocytes Relative: 10 %
Neutro Abs: 8.1 10*3/uL — ABNORMAL HIGH (ref 1.7–7.7)
Neutrophils Relative %: 72 %
Platelet Count: 228 10*3/uL (ref 150–400)
RBC: 4.92 MIL/uL (ref 4.22–5.81)
RDW: 16.1 % — ABNORMAL HIGH (ref 11.5–15.5)
WBC Count: 11.2 10*3/uL — ABNORMAL HIGH (ref 4.0–10.5)
nRBC: 0 % (ref 0.0–0.2)

## 2020-04-24 LAB — LACTATE DEHYDROGENASE: LDH: 185 U/L (ref 98–192)

## 2020-04-24 NOTE — Progress Notes (Signed)
Referral MD  Reason for Referral: Recurrent non-Hodgkin's lymphoma-ethmoidal recurrence  Chief Complaint  Patient presents with  . Follow-up  : Patient really cannot give history.  HPI: Brendan Gonzalez is well-known to me.  Its been a couple years since I last saw him.  We had initially seen him because of large cell lymphoma of the bone.  This was down in the sacrum.  This was back in 2019.  He had radiation to the sacrum.  We then try to give him Rituxan/Bendamustine.  He only had 3 cycles of treatment before he stopped because of toxicity.  We have not seen him since.  He apparently lost vision in the left eye many months ago.  This really was not thought to be all that unusual.  However, he then began to have problems with pain in the nasal area.  He had no nasal discharge.  He had no swallowing difficulties.  There was some headache.  He initially was seen by Dr. Benjamine Mola.  An MRI was done in October 2019.  This showed an enhancing soft tissue mass in the posterior left ethmoid sinus measuring 5.1 x 3.3 x 2.2 cm.  There is extends to the left orbital apex with mass-effect on the left optic nerve.  Lesion Saintsing to the superior nasal cavity.  Fluid is present in the left mastoid air cells.  A biopsy was then done.  I think this was done on 01/24/2020.  There was scant material that was given to the pathologist.  The pathology report [id number]) showed atypical lymphocytic infiltrate.  Cells were positive for CD3.  There was no significant CD20 population.  However, there is very limited material.  At this point, Brendan Gonzalez was then referred to Dr. Vinetta Bergamo.  Dr. Vinetta Bergamo felt that this was all recurrent lymphoma.  A CT scan was done on 03/30/2020.  This showed the aggressive mass in the posterior left ethmoid sinus.  There was some progression compared to prior scans.  There is extensive bony destruction which includes the floor of the anterior cranial fossa and the anteromedial left middle cranial  fossa/sphenoid wing.  There is left intraorbital extension of tumor with mass-effect on the optic nerve.  Based on this, Brendan Gonzalez was referred back to the [facility] for an evaluation.  He clearly is elderly.  He is 87 years old.  Is been 2 years since we saw him.  He is weaker.  His performance status is probably ECOG 3 at best.  He has very little activity.  He does have some pain.  He takes a lot of Tylenol.  I suspect this is why his liver function studies are elevated.  There is no nasal discharge.  He has no nasal bleeding.  He denies any obvious headache.    Past Medical History:  Diagnosis Date  . C2 cervical fracture ([place]) 07/2017   Type II morphology with posterior displacement--C collar (potentially indefinitely).  . Chronic lower back pain   . Chronic renal insufficiency, stage III (moderate) (HCC)    GFR 50s-60s  . Debilitated patient   . Diffuse large cell non-Hodgkin's lymphoma ([facility]) 12/15/2017   B cell NHL --osseous involvement only.  RT 12/2017 helped with his pain; pt is to get chemo 01/2018.  PET 03/06/18: good response to tx.  No further treatment or imaging done.  . Generalized weakness   . Goals of care, counseling/discussion 12/15/2017  . History of colon cancer 1993  . History of CVA (cerebrovascular  accident)    Left MCA territory.  Carotid dopplers ok, Brain MRA ok, ECHO with grd I DD.  As of 02/2018->no ASA or plavix or statin due to pt getting chemo and RT + the related alteration in goals of care.  . Hypertension   . Lumbar spinal stenosis    Surgery 2012 helped but in 2015 pain has returned  . Lytic bone lesions on xray 08/2017   Skull, mildly abnormal bone scan. SPEP/UPEP w/out monoclonal spike.  Dr. Marin Olp eval 08/2017. Lytic lesions worsened-> Sacrum bx->large B cell lymphoma->XRT +XGEVA and Rituxan/bendamustine (completed 3 of 6 planned cycles--last one 03/2018).  . 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 Benjamine Mola) did bx 01/24/20 and found "Cystic left ethmoid mass. A large amount of mucoid fluid was drained" path was nondiagnostic  . Osteoarthritis   . Self-care deficit   . Sudden loss of vision, left 12/2019   Dr. Posey Pronto at [facility] eye-->exam ok-->retinal specialist->ok-->MR brain/maxillofacial->L ethmoid/sphenoid sinus mass invading orbits, compressing L>R optic nerve  . Unstable gait   . Venous insufficiency of both lower extremities    made worse by amlodipine  . Vision loss of left eye 12/2019   MRI showed L sinus mass with mass effect on L>R optic nerves  :  Past Surgical History:  Procedure Laterality Date  . BONE BIOPSY  12/2017   sacrum->large B cell lymphoma (NHL)  . CHOLECYSTECTOMY  1984  . ETHMOIDECTOMY Left 01/24/2020   Procedure: ETHMOIDECTOMY AND BIOPSY OF MASS;  Surgeon: Leta Baptist, MD;  Location: [facility];  Service: ENT;  Laterality: Left;  . INGUINAL HERNIA REPAIR  1974   Bilat  . LUMBAR SPINE SURGERY  2012   for spinal stenosis (Dr. Jonelle Sports in [place])  . LUNG LOBECTOMY  1993   LLL per pt--worry of possible cancer but turned out to be benign.  [name] PARTIAL COLECTOMY  1993   for colon cancer  . TRANSTHORACIC ECHOCARDIOGRAM  03/15/2016   Normal EF, grd I DD, mod dil aortic root  :   Current Outpatient Medications:  .  amLODipine (NORVASC) 10 MG tablet, Take 1 tablet (10 mg total) by mouth daily. (Patient not taking: No sig reported), Disp: 30 tablet, Rfl: 0 .  atenolol (TENORMIN) 50 MG tablet, Take 1 tablet (50 mg total) by mouth daily. (Patient not taking: No sig reported), Disp: 90 tablet, Rfl: 3 .  cholecalciferol (VITAMIN D) 1000 UNITS tablet, Take 1,000 Units by mouth daily., Disp: , Rfl:  .  cloNIDine (CATAPRES-TTS-1) 0.1 mg/24hr patch, Place 1 patch (0.1 mg total) onto the skin once a week. (Patient not taking: No sig reported), Disp: 4 patch, Rfl: 6 .  escitalopram (LEXAPRO) 10 MG tablet, TAKE ONE TABLET BY MOUTH EVERY DAY,  Disp: 30 tablet, Rfl: 1 .  oxyCODONE (OXY IR/ROXICODONE) 5 MG immediate release tablet, Take 5 mg by mouth 4 (four) times daily as needed., Disp: , Rfl:  .  vitamin C (ASCORBIC ACID) 500 MG tablet, Take 500 mg by mouth daily., Disp: , Rfl:  .  vitamin E 400 UNIT capsule, Take 400 Units by mouth daily. , Disp: , Rfl: :  :  No Known Allergies:  Family History  Problem Relation Age of Onset  . Parkinson's disease Mother   . Heart attack Mother   . Cancer Brother   . Cancer Daughter   :  Social History   Socioeconomic History  . Marital  status: Married    Spouse name: Not on file  . Number of children: Not on file  . Years of education: Not on file  . Highest education level: Not on file  Occupational History  . Not on file  Tobacco Use  . Smoking status: Former Research scientist (life sciences)  . Smokeless tobacco: Never Used  Vaping Use  . Vaping Use: Never used  Substance and Sexual Activity  . Alcohol use: Yes    Comment: daily, gin every night  . Drug use: No  . Sexual activity: Not on file  Other Topics Concern  . Not on file  Social History Narrative   Married, 1 son and 1 daughter.   Education: BS in Pharmacologist.   Occupation: Pharmacologist (retired).   As of 01/2014, living at [facility] with his wife.   No T/A/Ds.     Social Determinants of Health   Financial Resource Strain: Not on file  Food Insecurity: Not on file  Transportation Needs: Not on file  Physical Activity: Not on file  Stress: Not on file  Social Connections: Not on file  Intimate Partner Violence: Not on file  :  Review of Systems  Constitutional: Positive for malaise/fatigue and weight loss.  HENT: Positive for congestion and sinus pain.   Eyes: Positive for blurred vision and pain.  Respiratory: Negative.   Cardiovascular: Negative.   Gastrointestinal: Negative.   Genitourinary: Negative.   Musculoskeletal: Negative.   Skin: Negative.   Neurological: Negative.   Endo/Heme/Allergies: Negative.    Psychiatric/Behavioral: Negative.      Exam:  This is a elderly, thin white male in no obvious distress.  Vital signs show temperature of 98.  Pulse 53.  Blood pressure 123/75.  Weight is 144 pounds.  Head and neck exam shows no obvious adenopathy in the neck.  He has no oral lesions.  There is decent extraocular muscle movement in the eyes.  He has some tenderness in the ethmoid sinuses.  There is some tenderness in the left maxillary sinus to palpation.  There is no palpable thyroid.  Lungs are clear.  Cardiac exam regular rate and rhythm.  Abdomen is soft.  Bowel sounds are present.  There is no fluid wave.  There is no palpable liver or spleen tip.  Extremities shows some muscle atrophy in upper and lower extremities.  Neurological exam shows no obvious neurological deficits.  Skin exam shows no rashes.  @IPVITALS @   Recent Labs    04/24/20 1521  WBC 11.2*  HGB 14.6  HCT 44.2  PLT 228   Recent Labs    04/24/20 1521  NA 136  K 4.3  CL 99  CO2 28  GLUCOSE 91  BUN 21  CREATININE 1.13  CALCIUM 10.3    Blood smear review: None  Pathology: Above    Assessment and Plan: Brendan Gonzalez is a 87 year old white male.  He actually served in [place].  He was in the [employer].  He is a true American hero.  He had a large cell lymphoma of the bone a couple years ago.  I had to suspect that he also has recurrence now.  This would be a unusual area for recurrence.  I am somewhat surprised by this.  We clearly are just dealing with quality of life issues.  I think the best way to try to help him would be radiation therapy.  I would think that there would be a reasonable chance of getting a response.  Again he is  not going to tolerate full dose radiation.  And some centers, I would think what we are looking at would be considered CNS lymphoma.  Again, he is nowhere close to being considered as a patient for therapy for his CNS lymphoma.  If we can just give radiation therapy this will  I think make his life a little bit better.  I just feel bad for him.  His son comes with him.  As always, his son is doing a great job.  We will speak with Dr. Sondra Come of Radiation Oncology on Monday.  Dr. Sondra Come knows Brendan Gonzalez and treated him for his lymphoma a couple years ago.  As far as getting Mr. Robin back to our office, we will have to see how everything goes with radiation.

## 2020-04-25 ENCOUNTER — Other Ambulatory Visit: Payer: Self-pay | Admitting: Family Medicine

## 2020-04-25 LAB — IGG, IGA, IGM
IgA: 274 mg/dL (ref 61–437)
IgG (Immunoglobin G), Serum: 684 mg/dL (ref 603–1613)
IgM (Immunoglobulin M), Srm: 21 mg/dL (ref 15–143)

## 2020-04-27 ENCOUNTER — Telehealth: Payer: Self-pay

## 2020-04-27 NOTE — Telephone Encounter (Signed)
No 04-24-20 los entered    aom

## 2020-04-30 NOTE — Progress Notes (Signed)
Location/Histology of non-Hodgkin's lymphoma-ethmoidal    Patient presented with symptoms of:   Past or anticipated interventions, if any, per medical oncology  Dose of Decadron, if applicable:   Recent neurologic symptoms, if any:   Seizures:  None  Headaches: None  Nausea:  None  Dizziness/ataxia: None  Difficulty with hand coordination: None  Focal numbness/weakness: None  Visual deficits/changes: Cann't see out of left eye Confusion/Memory deficits: None Painful bone metastases at present, if any:   SAFETY ISSUES:  Prior radiation? Three years ago  In Hip  Pacemaker/ICD?  no  Possible current pregnancy? no  Is the patient on methotrexate? no  Additional Complaints / other details:  Vitals:   05/06/20 1022  BP: 100/78  Pulse: 90  Resp: 18  Temp: (!) 97.5 F (36.4 C)  TempSrc: Oral  SpO2: 98%  Weight: 64.6 kg

## 2020-05-06 ENCOUNTER — Encounter: Payer: Self-pay | Admitting: Radiation Oncology

## 2020-05-06 ENCOUNTER — Other Ambulatory Visit: Payer: Self-pay

## 2020-05-06 ENCOUNTER — Ambulatory Visit
Admission: RE | Admit: 2020-05-06 | Discharge: 2020-05-06 | Disposition: A | Discharge status: Home / Self Care | Payer: Medicare Other | Source: Ambulatory Visit | Attending: Radiation Oncology | Admitting: Radiation Oncology

## 2020-05-06 VITALS — BP 100/78 | HR 90 | Temp 97.5°F | Resp 18 | Wt 142.5 lb

## 2020-05-06 DIAGNOSIS — C8330 Diffuse large B-cell lymphoma, unspecified site: Secondary | ICD-10-CM

## 2020-05-06 DIAGNOSIS — C8336 Diffuse large B-cell lymphoma, intrapelvic lymph nodes: Secondary | ICD-10-CM | POA: Insufficient documentation

## 2020-05-06 DIAGNOSIS — Z79899 Other long term (current) drug therapy: Secondary | ICD-10-CM | POA: Diagnosis not present

## 2020-05-06 NOTE — Progress Notes (Signed)
Radiation Oncology         [phone] ________________________________  Name: Brendan Gonzalez MRN: [id number]  Date: 05/06/2020  DOB: [birth year]  Re-Evaluation Note  CC: McGowen, Adrian Blackwater, MD  Volanda Napoleon, MD    ICD-10-CM   1. Diffuse large cell non-Hodgkin's lymphoma (HCC)  C83.30     Diagnosis: Recurrent diffuse large cell lymphoma  Narrative:  The patient presents today to discuss radiation treatment options. He is accompanied by his son. He was originally seen in consultation on 12/05/2017 and then underwent palliative radiation therapy directed at the left pelvis / sacro-iliac (35 Gy delivered in 14 fractions of 2.5 Gy; 3D, photon / 15X, 10X, 6X) from 12/19/2017 - 01/05/2018. He tolerated radiation treatment relatively well but did report nocturia x3 throughout treatment as well as diarrhea and increasing fatigue. However, he also reported improvement in pain.  PET scan on 03/06/2018 showed response to therapy of osseous lymphoma. There was no new or progressive disease, nor was there any evidence of soft tissue lymphoma.  In more recent history, the patient began to experience visual loss in the left eye over a several month time span. He underwent an MRI of the head and orbits on 01/17/2020 that showed an enhancing soft tissue mass lesion in the posterior left ethmoid air cells with mass effect on the left optic nerve, most concerning for a neoplasm. There was noted to be mass effect on the left orbit with mass effect on the right optic nerve. Additionally, there was moderate atrophy and white matter disease bilaterally that likely reflected the sequela of chronic microvascular ischemia as well as left mastoid effusion without the presence of an obstructing nasopharyngeal lesion. There was no acute intracranial abnormality.  CTA of head and neck on that same day showed a 5.1 x 2.7 x 1.8 cm soft tissue mass lesion in the posterior left ethmoid air cells and sphenoid sinus extending  to the left orbital apex, impacting the left optic nerve, most likely representing a malignancy. There was also noted to be mass effect on the left orbit and, in particular, on the optic nerve at the orbital apex. Additionally, there were mild atherosclerotic changes at the carotid bifurcations bilaterally and within the cavernous internal carotid arteries bilaterally without significant stenosis. There was no significant proximal stenosis, aneurysm, or branch vessel occlusion within the Circle of Willis. Finally, there was noted to be a remote dens fracture, multilevel spondylosis of the cervical spine, and a 1.4 cm right thyroid nodule that was not clinically significant.  Maxillofacial CT scan on 03/30/2020 showed the re-demonstrated large aggressive mass centered in the posterior left ethmoid air cells. Evaluation was limited across modalities, but the mass may have slightly progressed in comparison to prior. There was extensive bony destruction, which included destruction of the floor of the anterior cranial fossa and the anteromedial left middle cranial fossa/sphenoid wing with possible intracranial extension of tumor. As noted on the prior, there was left intraorbital extension of tumor with mass effect on the optic nerve.   On October 15 the patient underwent biopsy of the ethmoid mass.  Pathology from this biopsy showed atypical lymphocytic infiltrate suspicious for but not diagnostic lymphoma  The patient was last seen by Dr. Marin Olp on 04/24/2020, who recommended radiation therapy for what appears to be a recurrence of his large cell lymphoma.  On review of systems, he reports intermittent pain in the left periorbital area and occasional headaches. He denies epistaxis.  According to son the  patient's appetite is poor patient's energy level is poor given his performance status it would be very difficult just to come in for  radiation treatments.  Allergies:  has No Known  Allergies.  Meds: Current Outpatient Medications  Medication Sig Dispense Refill  . amLODipine (NORVASC) 10 MG tablet Take 1 tablet (10 mg total) by mouth daily. 30 tablet 0  . atenolol (TENORMIN) 50 MG tablet Take 1 tablet (50 mg total) by mouth daily. 90 tablet 3  . cholecalciferol (VITAMIN D) 1000 UNITS tablet Take 1,000 Units by mouth daily.    [name] escitalopram (LEXAPRO) 10 MG tablet TAKE ONE TABLET BY MOUTH EVERY DAY 30 tablet 1  . oxyCODONE (OXY IR/ROXICODONE) 5 MG immediate release tablet Take 5 mg by mouth 4 (four) times daily as needed.    . vitamin C (ASCORBIC ACID) 500 MG tablet Take 500 mg by mouth daily.    . vitamin E 400 UNIT capsule Take 400 Units by mouth daily.     . cloNIDine (CATAPRES-TTS-1) 0.1 mg/24hr patch Place 1 patch (0.1 mg total) onto the skin once a week. (Patient not taking: Reported on 05/06/2020) 4 patch 6   No current facility-administered medications for this encounter.    Physical Findings: The patient is in no acute distress. Patient is alert and oriented.  Remains in a wheelchair for examination, somewhat hard of hearing  weight is 142 lb 8 oz (64.6 kg). His oral temperature is 97.5 F (36.4 C) (abnormal). His blood pressure is 100/78 and his pulse is 90. His respiration is 18 and oxygen saturation is 98%.    Lungs are clear to auscultation bilaterally. Heart has regular rate and rhythm. No palpable cervical, supraclavicular, or axillary adenopathy. Abdomen soft, non-tender, normal bowel sounds.  Patient is noted to have some periorbital swelling.  Extraocular movements are limited with the left eye when looking laterally.  The patient reports seeing light and dark from the left eye but no significant vision  Lab Findings: Lab Results  Component Value Date   WBC 11.2 (H) 04/24/2020   HGB 14.6 04/24/2020   HCT 44.2 04/24/2020   MCV 89.8 04/24/2020   PLT 228 04/24/2020    Radiographic Findings: No results found.  Impression: Recurrent diffuse large  cell lymphoma  The patient appears to have a extensive recurrence in the sinus region with bone destruction.  As above pathology is suspicious but not diagnostic of malignancy in this area.  Based on imaging studies in all likelihood this is lymphoma based on his prior history.  Today I spoke with the patient and his son about consideration for palliative radiation therapy.  This point the patient does not have any significant pain or bleeding.  Given the interval with his vision defect I am doubtful that he could recover blindness in the left eye with palliative radiation therapy.  Plan: At this time the patient and his son are unsure whether the patient would like to proceed with radiation therapy especially given his advanced age of 10 and overall performance status at this time.I have asked that they call me back if they wish to pursue radiation therapy.  Would anticipate approximately 10 treatments possibly every other day given the patient's performance status if he decides to proceed with treatment.  Total time spent in this encounter was 35 minutes which included reviewing the patient's most recent PET scan, CT scans, MRIs, follow-ups, physical examination, and documentation.  -----------------------------------  Blair Promise, PhD, MD  This document serves as  a record of services personally performed by Gery Pray, MD. It was created on his behalf by Clerance Lav, a trained medical scribe. The creation of this record is based on the scribe's personal observations and the provider's statements to them. This document has been checked and approved by the attending provider.

## 2020-05-07 NOTE — Progress Notes (Deleted)
HPI: FU hypertension. Echocardiogram December 2017 showed normal LV function, grade 1 diastolic dysfunction, trace aortic insufficiency and aortic root dilated at 44 mm.  Abdominal CT November 2019 showed left main and three-vessel coronary atherosclerosis. Since last seen   Current Outpatient Medications  Medication Sig Dispense Refill  . amLODipine (NORVASC) 10 MG tablet Take 1 tablet (10 mg total) by mouth daily. 30 tablet 0  . atenolol (TENORMIN) 50 MG tablet Take 1 tablet (50 mg total) by mouth daily. 90 tablet 3  . cholecalciferol (VITAMIN D) 1000 UNITS tablet Take 1,000 Units by mouth daily.    . cloNIDine (CATAPRES-TTS-1) 0.1 mg/24hr patch Place 1 patch (0.1 mg total) onto the skin once a week. (Patient not taking: Reported on 05/06/2020) 4 patch 6  . escitalopram (LEXAPRO) 10 MG tablet TAKE ONE TABLET BY MOUTH EVERY DAY 30 tablet 1  . oxyCODONE (OXY IR/ROXICODONE) 5 MG immediate release tablet Take 5 mg by mouth 4 (four) times daily as needed.    . vitamin C (ASCORBIC ACID) 500 MG tablet Take 500 mg by mouth daily.    . vitamin E 400 UNIT capsule Take 400 Units by mouth daily.      No current facility-administered medications for this visit.     Past Medical History:  Diagnosis Date  . C2 cervical fracture ([place]) 07/2017   Type II morphology with posterior displacement--C collar (potentially indefinitely).  . Chronic lower back pain   . Chronic renal insufficiency, stage III (moderate) (HCC)    GFR 50s-60s  . Debilitated patient   . Diffuse large cell non-Hodgkin's lymphoma ([place]) 12/15/2017   B cell NHL --osseous involvement only.  RT 12/2017 helped with his pain; pt is to get chemo 01/2018.  PET 03/06/18: good response to tx.  No further treatment or imaging done.  . Generalized weakness   . Goals of care, counseling/discussion 12/15/2017  . History of colon cancer 1993  . History of CVA (cerebrovascular accident)    Left MCA territory.  Carotid dopplers ok, Brain MRA  ok, ECHO with grd I DD.  As of 02/2018->no ASA or plavix or statin due to pt getting chemo and RT + the related alteration in goals of care.  . Hypertension   . Lumbar spinal stenosis    Surgery 2012 helped but in 2015 pain has returned  . Lytic bone lesions on xray 08/2017   Skull, mildly abnormal bone scan. SPEP/UPEP w/out monoclonal spike.  Dr. Marin Olp eval 08/2017. Lytic lesions worsened-> Sacrum bx->large B cell lymphoma->XRT +XGEVA and Rituxan/bendamustine (completed 3 of 6 planned cycles--last one 03/2018).  . 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 Benjamine Mola) did bx 01/24/20 and found "Cystic left ethmoid mass. A large amount of mucoid fluid was drained" path was nondiagnostic  . Osteoarthritis   . Self-care deficit   . Sudden loss of vision, left 12/2019   Dr. Posey Pronto at [facility] eye-->exam ok-->retinal specialist->ok-->MR brain/maxillofacial->L ethmoid/sphenoid sinus mass invading orbits, compressing L>R optic nerve  . Unstable gait   . Venous insufficiency of both lower extremities    made worse by amlodipine  . Vision loss of left eye 12/2019   MRI showed L sinus mass with mass effect on L>R optic nerves    Past Surgical History:  Procedure Laterality Date  . BONE BIOPSY  12/2017   sacrum->large B cell lymphoma (NHL)  . CHOLECYSTECTOMY  1984  . ETHMOIDECTOMY  Left 01/24/2020   Procedure: ETHMOIDECTOMY AND BIOPSY OF MASS;  Surgeon: Leta Baptist, MD;  Location: [place];  Service: ENT;  Laterality: Left;  . INGUINAL HERNIA REPAIR  1974   [place]  . LUMBAR SPINE SURGERY  2012   for spinal stenosis (Dr. Jonelle Sports in [place])  . LUNG LOBECTOMY  1993   LLL per pt--worry of possible cancer but turned out to be benign.  [name] PARTIAL COLECTOMY  1993   for colon cancer  . TRANSTHORACIC ECHOCARDIOGRAM  03/15/2016   Normal EF, grd I DD, mod dil aortic root    Social History   Socioeconomic History  . Marital status: Married    Spouse  name: Not on file  . Number of children: Not on file  . Years of education: Not on file  . Highest education level: Not on file  Occupational History  . Not on file  Tobacco Use  . Smoking status: Former Research scientist (life sciences)  . Smokeless tobacco: Never Used  Vaping Use  . Vaping Use: Never used  Substance and Sexual Activity  . Alcohol use: Yes    Comment: daily, gin every night  . Drug use: No  . Sexual activity: Not on file  Other Topics Concern  . Not on file  Social History Narrative   Married, 1 son and 1 daughter.   Education: BS in Pharmacologist.   Occupation: Pharmacologist (retired).   As of 01/2014, living at [facility] with his wife.   No T/A/Ds.     Social Determinants of Health   Financial Resource Strain: Not on file  Food Insecurity: Not on file  Transportation Needs: Not on file  Physical Activity: Not on file  Stress: Not on file  Social Connections: Not on file  Intimate Partner Violence: Not on file    Family History  Problem Relation Age of Onset  . Parkinson's disease Mother   . Heart attack Mother   . Cancer Brother   . Cancer Daughter     ROS: no fevers or chills, productive cough, hemoptysis, dysphasia, odynophagia, melena, hematochezia, dysuria, hematuria, rash, seizure activity, orthopnea, PND, pedal edema, claudication. Remaining systems are negative.  Physical Exam: Well-developed well-nourished in no acute distress.  Skin is warm and dry.  HEENT is normal.  Neck is supple.  Chest is clear to auscultation with normal expansion.  Cardiovascular exam is regular rate and rhythm.  Abdominal exam nontender or distended. No masses palpated. Extremities show no edema. neuro grossly intact  ECG- personally reviewed  A/P  1 Hypertension-BP controlled; continue present meds and follow.   Kirk Ruths, MD

## 2020-05-20 ENCOUNTER — Ambulatory Visit: Payer: Medicare Other | Admitting: Cardiology

## 2020-06-02 ENCOUNTER — Ambulatory Visit: Payer: Medicare Other | Admitting: Cardiology

## 2020-06-02 DIAGNOSIS — R350 Frequency of micturition: Secondary | ICD-10-CM | POA: Diagnosis not present

## 2020-06-03 ENCOUNTER — Ambulatory Visit: Payer: Medicare Other | Admitting: Family Medicine

## 2020-06-03 DIAGNOSIS — Z0289 Encounter for other administrative examinations: Secondary | ICD-10-CM

## 2020-06-03 NOTE — Progress Notes (Deleted)
OFFICE VISIT  06/03/2020  CC: No chief complaint on file.   HPI:    Patient is a 87 y.o. Caucasian male who presents for urinary complaint.  Past Medical History:  Diagnosis Date  . C2 cervical fracture ([place]) 07/2017   Type II morphology with posterior displacement--C collar (potentially indefinitely).  . Chronic lower back pain   . Chronic renal insufficiency, stage III (moderate) (HCC)    GFR 50s-60s  . Debilitated patient   . 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  RT 12/2017 helped with his pain; pt is to get chemo 01/2018.  PET 03/06/18: good response to tx.  No further treatment or imaging done.  . Generalized weakness   . Goals of care, counseling/discussion 12/15/2017  . History of colon cancer 1993  . History of CVA (cerebrovascular accident)    Left MCA territory.  Carotid dopplers ok, Brain MRA ok, ECHO with grd I DD.  As of 02/2018->no ASA or plavix or statin due to pt getting chemo and RT + the related alteration in goals of care.  . Hypertension   . Lumbar spinal stenosis    Surgery 2012 helped but in 2015 pain has returned  . Lytic bone lesions on xray 08/2017   Skull, mildly abnormal bone scan. SPEP/UPEP w/out monoclonal spike.  Dr. Marin Olp eval 08/2017. Lytic lesions worsened-> Sacrum bx->large B cell lymphoma->XRT +XGEVA and Rituxan/bendamustine (completed 3 of 6 planned cycles--last one 03/2018).  . 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 Benjamine Mola) did bx 01/24/20 and found "Cystic left ethmoid mass. A large amount of mucoid fluid was drained" path was nondiagnostic  . Osteoarthritis   . Self-care deficit   . Sudden loss of vision, left 12/2019   Dr. Posey Pronto at [facility] eye-->exam ok-->retinal specialist->ok-->MR brain/maxillofacial->L ethmoid/sphenoid sinus mass invading orbits, compressing L>R optic nerve  . Unstable gait   . Venous  insufficiency of both lower extremities    made worse by amlodipine  . Vision loss of left eye 12/2019   MRI showed L sinus mass with mass effect on L>R optic nerves    Past Surgical History:  Procedure Laterality Date  . BONE BIOPSY  12/2017   sacrum->large B cell lymphoma (NHL)  . CHOLECYSTECTOMY  1984  . ETHMOIDECTOMY Left 01/24/2020   Procedure: ETHMOIDECTOMY AND BIOPSY OF MASS;  Surgeon: Leta Baptist, MD;  Location: [place];  Service: ENT;  Laterality: Left;  . INGUINAL HERNIA REPAIR  1974   [place]  . LUMBAR SPINE SURGERY  2012   for spinal stenosis (Dr. Jonelle Sports in [place])  . LUNG LOBECTOMY  1993   LLL per pt--worry of possible cancer but turned out to be benign.  [name] PARTIAL COLECTOMY  1993   for colon cancer  . TRANSTHORACIC ECHOCARDIOGRAM  03/15/2016   Normal EF, grd I DD, mod dil aortic root    Outpatient Medications Prior to Visit  Medication Sig Dispense Refill  . amLODipine (NORVASC) 10 MG tablet Take 1 tablet (10 mg total) by mouth daily. 30 tablet 0  . atenolol (TENORMIN) 50 MG tablet Take 1 tablet (50 mg total) by mouth daily. 90 tablet 3  . cholecalciferol (VITAMIN D) 1000 UNITS tablet Take 1,000 Units by mouth daily.    . cloNIDine (CATAPRES-TTS-1) 0.1 mg/24hr patch Place 1 patch (0.1 mg total) onto the skin once a week. (Patient not taking: Reported on 05/06/2020)  4 patch 6  . escitalopram (LEXAPRO) 10 MG tablet TAKE ONE TABLET BY MOUTH EVERY DAY 30 tablet 1  . oxyCODONE (OXY IR/ROXICODONE) 5 MG immediate release tablet Take 5 mg by mouth 4 (four) times daily as needed.    . vitamin C (ASCORBIC ACID) 500 MG tablet Take 500 mg by mouth daily.    . vitamin E 400 UNIT capsule Take 400 Units by mouth daily.      No facility-administered medications prior to visit.    No Known Allergies  ROS As per HPI  PE: Vitals with BMI 05/06/2020 04/24/2020 04/01/2020  Height - - 5\' 5"   Weight 142 lbs 8 oz 144 lbs 151 lbs  BMI - - 29.79  Systolic 892 119 417  Diastolic 78 75 408   Pulse 90 53 85     ***  LABS:    Chemistry      Component Value Date/Time   NA 136 04/24/2020 1521   K 4.3 04/24/2020 1521   CL 99 04/24/2020 1521   CO2 28 04/24/2020 1521   BUN 21 04/24/2020 1521   CREATININE 1.13 04/24/2020 1521   CREATININE 1.06 01/21/2020 1032      Component Value Date/Time   CALCIUM 10.3 04/24/2020 1521   ALKPHOS 251 (H) 04/24/2020 1521   AST 68 (H) 04/24/2020 1521   ALT 104 (H) 04/24/2020 1521   BILITOT 1.2 04/24/2020 1521     Lab Results  Component Value Date   WBC 11.2 (H) 04/24/2020   HGB 14.6 04/24/2020   HCT 44.2 04/24/2020   MCV 89.8 04/24/2020   PLT 228 04/24/2020   Lab Results  Component Value Date   HGBA1C 5.2 03/15/2016   POC CC dipstick UA today: ***   IMPRESSION AND PLAN:  No problem-specific Assessment & Plan notes found for this encounter.   An After Visit Summary was printed and given to the patient.  FOLLOW UP: No follow-ups on file.  Signed:  Crissie Sickles, MD           06/03/2020

## 2020-06-17 ENCOUNTER — Telehealth: Payer: Self-pay | Admitting: Family Medicine

## 2020-06-17 NOTE — Telephone Encounter (Signed)
Attempted to schedule AWV. Unable to LVM.  Will try at later time.  

## 2020-06-26 ENCOUNTER — Telehealth: Payer: Self-pay

## 2020-06-26 NOTE — Telephone Encounter (Signed)
[place] for evaluation?

## 2020-06-26 NOTE — Telephone Encounter (Signed)
Albina Billet from [employer] calling about patient. Requesting for hospice evaluation.  She states that Wife of patient is being evaluated for hospice tomorrow.  Trying to get approved today while Hospice is at nursing home tomorrow.  Please call Albina Billet at [phone]

## 2020-06-26 NOTE — Telephone Encounter (Signed)
Yes okay 

## 2020-06-26 NOTE — Telephone Encounter (Signed)
Order given to [facility]. Requested for recent office visit note and demographic information be sent since they do not have a referral. Fax number provided is [phone]. Will fax over everything today

## 2020-06-30 ENCOUNTER — Telehealth: Payer: Self-pay | Admitting: Family Medicine

## 2020-06-30 NOTE — Telephone Encounter (Signed)
Received faxed orders from [facility]. Placed in Dr. Idelle Leech inbox to be signed.

## 2020-06-30 NOTE — Telephone Encounter (Signed)
Placed on PCP desk to review and sign, if appropriate.  

## 2020-07-02 NOTE — Telephone Encounter (Signed)
LM for Halford Chessman regarding faxed orders.

## 2020-07-02 NOTE — Telephone Encounter (Signed)
Pls make sure they know I am simply his "attending of record" and I don't want to be the provider making any recommendations regarding orders and symptom management.  The "hospice MD" should be doing this.-thx

## 2020-07-03 NOTE — Telephone Encounter (Signed)
LM for Albina Billet to return call.

## 2020-07-06 ENCOUNTER — Telehealth: Payer: Self-pay | Admitting: Family Medicine

## 2020-07-06 NOTE — Telephone Encounter (Signed)
Received faxed orders from [facility]. Placed in Dr. Idelle Leech front office inbox to be signed and returned.

## 2020-07-06 NOTE — Telephone Encounter (Signed)
Spoke with Brendan Gonzalez and orders received are for admission and start of hospice care. If you agree to sign these orders which go along with 3/18 verbal when you agreed to be attending of record, all further orders will be deferred to hospice MD. If not, I can call her back and let her know we will still defer all orders to hospice MD. Her direct number for callback is [phone].  Placed on PCP desk to review and sign, if appropriate.

## 2020-07-06 NOTE — Telephone Encounter (Signed)
OK, orders signed and handed to [place]. Signed:  Crissie Sickles, MD           07/06/2020

## 2020-07-06 NOTE — Telephone Encounter (Signed)
Per 3/22 phone note, PCP stated "Pls make sure they know I am simply his "attending of record" and I don't want to be the provider making any recommendations regarding orders and symptom management.  The "hospice MD" should be doing this." will follow up with nursing staff at [facility] to inform

## 2020-07-06 NOTE — Telephone Encounter (Signed)
Spoke with Brendan Gonzalez regarding faxed comfort care orders. She stated currently PCP is listed as "attending of record" and if we receive a call from one of the nurses regarding comfort care orders, we could just defer this to of the hospice MD there.

## 2020-07-06 NOTE — Telephone Encounter (Signed)
LM for Nevin Bloodgood to return call regarding orders.

## 2020-08-04 ENCOUNTER — Telehealth: Payer: Self-pay | Admitting: Family Medicine

## 2020-08-04 NOTE — Telephone Encounter (Signed)
Spoke with Sherren Mocha and certificate sent by e-mail thru [facility] last week, please relinquish so it can be signed off by another doctor.  Please Advise.

## 2020-08-04 NOTE — Telephone Encounter (Signed)
OK --done. I called and talked to Jacqualin Combes as well. Signed:  Crissie Sickles, MD           08/04/2020

## 2020-08-04 NOTE — Telephone Encounter (Signed)
Caller: Ernst Spell Call back: [phone]     Needing death cerificate releasedn by [email or web address]  Was sent over last week

## 2020-08-09 DEATH — deceased
